# Patient Record
Sex: Male | Born: 1945
Health system: Southern US, Community
[De-identification: ages and names within clinical notes are randomized; demographics above are authoritative.]

## PROBLEM LIST (undated history)

## (undated) DIAGNOSIS — I4892 Unspecified atrial flutter: Secondary | ICD-10-CM

## (undated) DIAGNOSIS — D696 Thrombocytopenia, unspecified: Secondary | ICD-10-CM

## (undated) DIAGNOSIS — I10 Essential (primary) hypertension: Secondary | ICD-10-CM

## (undated) DIAGNOSIS — E119 Type 2 diabetes mellitus without complications: Secondary | ICD-10-CM

## (undated) DIAGNOSIS — J45909 Unspecified asthma, uncomplicated: Secondary | ICD-10-CM

## (undated) DIAGNOSIS — E785 Hyperlipidemia, unspecified: Secondary | ICD-10-CM

## (undated) DIAGNOSIS — D72810 Lymphocytopenia: Secondary | ICD-10-CM

## (undated) DIAGNOSIS — N189 Chronic kidney disease, unspecified: Secondary | ICD-10-CM

## (undated) HISTORY — PX: MELANOMA EXCISION: SHX5266

## (undated) HISTORY — DX: Type 2 diabetes mellitus without complications: E11.9

## (undated) HISTORY — DX: Thrombocytopenia, unspecified: D69.6

## (undated) HISTORY — DX: Hyperlipidemia, unspecified: E78.5

## (undated) HISTORY — DX: Unspecified asthma, uncomplicated: J45.909

## (undated) HISTORY — DX: Chronic kidney disease, unspecified: N18.9

## (undated) HISTORY — DX: Unspecified atrial flutter: I48.92

## (undated) HISTORY — PX: OTHER SURGICAL HISTORY: SHX169

## (undated) HISTORY — PX: KNEE SURGERY: SHX244

## (undated) HISTORY — DX: Lymphocytopenia: D72.810

## (undated) HISTORY — DX: Essential (primary) hypertension: I10

---

## 2004-05-26 ENCOUNTER — Ambulatory Visit: Payer: Self-pay | Admitting: Internal Medicine

## 2005-08-03 ENCOUNTER — Ambulatory Visit: Payer: Self-pay | Admitting: Internal Medicine

## 2005-09-15 ENCOUNTER — Ambulatory Visit: Payer: Self-pay | Admitting: Internal Medicine

## 2006-10-24 ENCOUNTER — Emergency Department: Payer: Self-pay | Admitting: Emergency Medicine

## 2009-08-19 ENCOUNTER — Ambulatory Visit: Payer: Self-pay | Admitting: Specialist

## 2009-11-12 ENCOUNTER — Ambulatory Visit: Payer: Self-pay | Admitting: Internal Medicine

## 2009-12-09 ENCOUNTER — Ambulatory Visit: Payer: Self-pay | Admitting: Internal Medicine

## 2010-01-19 ENCOUNTER — Ambulatory Visit: Payer: Self-pay | Admitting: Internal Medicine

## 2010-01-22 ENCOUNTER — Ambulatory Visit: Payer: Self-pay | Admitting: Internal Medicine

## 2011-06-02 DIAGNOSIS — I10 Essential (primary) hypertension: Secondary | ICD-10-CM | POA: Diagnosis not present

## 2011-06-02 DIAGNOSIS — E119 Type 2 diabetes mellitus without complications: Secondary | ICD-10-CM | POA: Diagnosis not present

## 2011-06-06 ENCOUNTER — Inpatient Hospital Stay: Payer: Self-pay | Admitting: Internal Medicine

## 2011-06-06 DIAGNOSIS — IMO0001 Reserved for inherently not codable concepts without codable children: Secondary | ICD-10-CM | POA: Diagnosis not present

## 2011-06-06 DIAGNOSIS — I4891 Unspecified atrial fibrillation: Secondary | ICD-10-CM | POA: Diagnosis not present

## 2011-06-06 DIAGNOSIS — Z8249 Family history of ischemic heart disease and other diseases of the circulatory system: Secondary | ICD-10-CM | POA: Diagnosis not present

## 2011-06-06 DIAGNOSIS — I059 Rheumatic mitral valve disease, unspecified: Secondary | ICD-10-CM | POA: Diagnosis not present

## 2011-06-06 DIAGNOSIS — E119 Type 2 diabetes mellitus without complications: Secondary | ICD-10-CM | POA: Diagnosis present

## 2011-06-06 DIAGNOSIS — I4892 Unspecified atrial flutter: Secondary | ICD-10-CM | POA: Diagnosis not present

## 2011-06-06 DIAGNOSIS — Z801 Family history of malignant neoplasm of trachea, bronchus and lung: Secondary | ICD-10-CM | POA: Diagnosis not present

## 2011-06-06 DIAGNOSIS — Z8489 Family history of other specified conditions: Secondary | ICD-10-CM | POA: Diagnosis not present

## 2011-06-06 DIAGNOSIS — I10 Essential (primary) hypertension: Secondary | ICD-10-CM | POA: Diagnosis not present

## 2011-06-06 DIAGNOSIS — B9789 Other viral agents as the cause of diseases classified elsewhere: Secondary | ICD-10-CM | POA: Diagnosis present

## 2011-06-06 DIAGNOSIS — R002 Palpitations: Secondary | ICD-10-CM | POA: Diagnosis not present

## 2011-06-06 DIAGNOSIS — Z79899 Other long term (current) drug therapy: Secondary | ICD-10-CM | POA: Diagnosis not present

## 2011-06-06 DIAGNOSIS — E785 Hyperlipidemia, unspecified: Secondary | ICD-10-CM | POA: Diagnosis not present

## 2011-06-06 DIAGNOSIS — R000 Tachycardia, unspecified: Secondary | ICD-10-CM | POA: Diagnosis not present

## 2011-06-06 DIAGNOSIS — E039 Hypothyroidism, unspecified: Secondary | ICD-10-CM | POA: Diagnosis present

## 2011-06-06 DIAGNOSIS — Z7982 Long term (current) use of aspirin: Secondary | ICD-10-CM | POA: Diagnosis not present

## 2011-06-06 LAB — COMPREHENSIVE METABOLIC PANEL
Anion Gap: 13 (ref 7–16)
BUN: 31 mg/dL — ABNORMAL HIGH (ref 7–18)
Bilirubin,Total: 0.6 mg/dL (ref 0.2–1.0)
Calcium, Total: 8.3 mg/dL — ABNORMAL LOW (ref 8.5–10.1)
Creatinine: 1.51 mg/dL — ABNORMAL HIGH (ref 0.60–1.30)
EGFR (African American): >60
EGFR (Non-African Amer.): 50 — ABNORMAL LOW
Glucose: 183 mg/dL — ABNORMAL HIGH (ref 65–99)
Osmolality: 291 (ref 275–301)
Potassium: 4.2 mmol/L (ref 3.5–5.1)
SGOT(AST): 24 U/L (ref 15–37)
Total Protein: 7.1 g/dL (ref 6.4–8.2)

## 2011-06-06 LAB — PRO B NATRIURETIC PEPTIDE: B-Type Natriuretic Peptide: 2223 pg/mL — ABNORMAL HIGH (ref 0–125)

## 2011-06-06 LAB — CBC
HGB: 15.1 g/dL (ref 13.0–18.0)
MCH: 28.6 pg (ref 26.0–34.0)
MCHC: 33.6 g/dL (ref 32.0–36.0)
Platelet: 135 10*3/uL — ABNORMAL LOW (ref 150–440)
RBC: 5.26 10*6/uL (ref 4.40–5.90)

## 2011-06-06 LAB — URINALYSIS, COMPLETE
Bacteria: NONE SEEN
Ketone: NEGATIVE
Nitrite: NEGATIVE
Ph: 5 (ref 4.5–8.0)
Protein: NEGATIVE
Specific Gravity: 1.014 (ref 1.003–1.030)

## 2011-06-06 LAB — TSH: Thyroid Stimulating Horm: 5.09 u[IU]/mL — ABNORMAL HIGH

## 2011-06-06 LAB — CK TOTAL AND CKMB (NOT AT ARMC)
CK, Total: 147 U/L (ref 35–232)
CK-MB: 1.7 ng/mL (ref 0.5–3.6)

## 2011-06-06 LAB — MAGNESIUM: Magnesium: 1.4 mg/dL — ABNORMAL LOW

## 2011-06-07 DIAGNOSIS — I4892 Unspecified atrial flutter: Secondary | ICD-10-CM | POA: Diagnosis not present

## 2011-06-07 DIAGNOSIS — I10 Essential (primary) hypertension: Secondary | ICD-10-CM | POA: Diagnosis not present

## 2011-06-07 DIAGNOSIS — I059 Rheumatic mitral valve disease, unspecified: Secondary | ICD-10-CM | POA: Diagnosis not present

## 2011-06-07 DIAGNOSIS — E785 Hyperlipidemia, unspecified: Secondary | ICD-10-CM | POA: Diagnosis not present

## 2011-06-07 DIAGNOSIS — I4891 Unspecified atrial fibrillation: Secondary | ICD-10-CM | POA: Diagnosis not present

## 2011-06-07 LAB — BASIC METABOLIC PANEL
Anion Gap: 10 (ref 7–16)
BUN: 17 mg/dL (ref 7–18)
Co2: 28 mmol/L (ref 21–32)
EGFR (African American): >60
EGFR (Non-African Amer.): >60
Glucose: 156 mg/dL — ABNORMAL HIGH (ref 65–99)
Sodium: 144 mmol/L (ref 136–145)

## 2011-06-07 LAB — TSH: Thyroid Stimulating Horm: 3.7 u[IU]/mL

## 2011-06-09 DIAGNOSIS — E038 Other specified hypothyroidism: Secondary | ICD-10-CM | POA: Diagnosis not present

## 2011-06-09 DIAGNOSIS — I4891 Unspecified atrial fibrillation: Secondary | ICD-10-CM | POA: Diagnosis not present

## 2011-06-09 DIAGNOSIS — IMO0001 Reserved for inherently not codable concepts without codable children: Secondary | ICD-10-CM | POA: Diagnosis not present

## 2011-06-09 DIAGNOSIS — I10 Essential (primary) hypertension: Secondary | ICD-10-CM | POA: Diagnosis not present

## 2011-06-09 DIAGNOSIS — R110 Nausea: Secondary | ICD-10-CM | POA: Diagnosis not present

## 2011-06-09 DIAGNOSIS — E785 Hyperlipidemia, unspecified: Secondary | ICD-10-CM | POA: Diagnosis not present

## 2011-06-09 DIAGNOSIS — N289 Disorder of kidney and ureter, unspecified: Secondary | ICD-10-CM | POA: Diagnosis not present

## 2011-06-09 DIAGNOSIS — R079 Chest pain, unspecified: Secondary | ICD-10-CM | POA: Diagnosis not present

## 2011-06-10 DIAGNOSIS — I119 Hypertensive heart disease without heart failure: Secondary | ICD-10-CM | POA: Diagnosis not present

## 2011-06-10 DIAGNOSIS — E782 Mixed hyperlipidemia: Secondary | ICD-10-CM | POA: Diagnosis not present

## 2011-06-10 DIAGNOSIS — I4892 Unspecified atrial flutter: Secondary | ICD-10-CM | POA: Diagnosis not present

## 2011-06-10 DIAGNOSIS — E119 Type 2 diabetes mellitus without complications: Secondary | ICD-10-CM | POA: Diagnosis not present

## 2011-06-16 DIAGNOSIS — I119 Hypertensive heart disease without heart failure: Secondary | ICD-10-CM | POA: Diagnosis not present

## 2011-06-16 DIAGNOSIS — I4891 Unspecified atrial fibrillation: Secondary | ICD-10-CM | POA: Diagnosis not present

## 2011-06-16 DIAGNOSIS — E291 Testicular hypofunction: Secondary | ICD-10-CM | POA: Diagnosis not present

## 2011-06-22 ENCOUNTER — Ambulatory Visit: Payer: Self-pay | Admitting: Internal Medicine

## 2011-06-22 DIAGNOSIS — R002 Palpitations: Secondary | ICD-10-CM | POA: Diagnosis not present

## 2011-06-22 DIAGNOSIS — E785 Hyperlipidemia, unspecified: Secondary | ICD-10-CM | POA: Diagnosis not present

## 2011-06-22 DIAGNOSIS — E119 Type 2 diabetes mellitus without complications: Secondary | ICD-10-CM | POA: Diagnosis not present

## 2011-06-22 DIAGNOSIS — I4892 Unspecified atrial flutter: Secondary | ICD-10-CM | POA: Diagnosis not present

## 2011-06-22 DIAGNOSIS — E669 Obesity, unspecified: Secondary | ICD-10-CM | POA: Diagnosis not present

## 2011-06-22 DIAGNOSIS — I4891 Unspecified atrial fibrillation: Secondary | ICD-10-CM | POA: Diagnosis not present

## 2011-06-22 DIAGNOSIS — Z7982 Long term (current) use of aspirin: Secondary | ICD-10-CM | POA: Diagnosis not present

## 2011-06-22 DIAGNOSIS — I10 Essential (primary) hypertension: Secondary | ICD-10-CM | POA: Diagnosis not present

## 2011-06-22 DIAGNOSIS — Z79899 Other long term (current) drug therapy: Secondary | ICD-10-CM | POA: Diagnosis not present

## 2011-06-22 DIAGNOSIS — I428 Other cardiomyopathies: Secondary | ICD-10-CM | POA: Diagnosis not present

## 2011-06-30 DIAGNOSIS — I119 Hypertensive heart disease without heart failure: Secondary | ICD-10-CM | POA: Diagnosis not present

## 2011-06-30 DIAGNOSIS — I2510 Atherosclerotic heart disease of native coronary artery without angina pectoris: Secondary | ICD-10-CM | POA: Diagnosis not present

## 2011-06-30 DIAGNOSIS — I471 Supraventricular tachycardia: Secondary | ICD-10-CM | POA: Diagnosis not present

## 2011-07-09 DIAGNOSIS — Z79899 Other long term (current) drug therapy: Secondary | ICD-10-CM | POA: Diagnosis not present

## 2011-07-09 DIAGNOSIS — R0989 Other specified symptoms and signs involving the circulatory and respiratory systems: Secondary | ICD-10-CM | POA: Diagnosis not present

## 2011-07-09 DIAGNOSIS — I4892 Unspecified atrial flutter: Secondary | ICD-10-CM | POA: Diagnosis not present

## 2011-07-09 DIAGNOSIS — I4891 Unspecified atrial fibrillation: Secondary | ICD-10-CM | POA: Diagnosis not present

## 2011-07-09 DIAGNOSIS — R0609 Other forms of dyspnea: Secondary | ICD-10-CM | POA: Diagnosis not present

## 2011-07-21 DIAGNOSIS — E119 Type 2 diabetes mellitus without complications: Secondary | ICD-10-CM | POA: Diagnosis not present

## 2011-07-21 DIAGNOSIS — E785 Hyperlipidemia, unspecified: Secondary | ICD-10-CM | POA: Diagnosis not present

## 2011-07-21 DIAGNOSIS — I4891 Unspecified atrial fibrillation: Secondary | ICD-10-CM | POA: Diagnosis not present

## 2011-07-21 DIAGNOSIS — I10 Essential (primary) hypertension: Secondary | ICD-10-CM | POA: Diagnosis not present

## 2011-07-22 DIAGNOSIS — J019 Acute sinusitis, unspecified: Secondary | ICD-10-CM | POA: Diagnosis not present

## 2011-07-26 DIAGNOSIS — I119 Hypertensive heart disease without heart failure: Secondary | ICD-10-CM | POA: Diagnosis not present

## 2011-07-26 DIAGNOSIS — I4891 Unspecified atrial fibrillation: Secondary | ICD-10-CM | POA: Diagnosis not present

## 2011-07-26 DIAGNOSIS — E119 Type 2 diabetes mellitus without complications: Secondary | ICD-10-CM | POA: Diagnosis not present

## 2011-07-26 DIAGNOSIS — E782 Mixed hyperlipidemia: Secondary | ICD-10-CM | POA: Diagnosis not present

## 2011-08-30 DIAGNOSIS — E782 Mixed hyperlipidemia: Secondary | ICD-10-CM | POA: Diagnosis not present

## 2011-08-30 DIAGNOSIS — I4892 Unspecified atrial flutter: Secondary | ICD-10-CM | POA: Diagnosis not present

## 2011-08-30 DIAGNOSIS — I059 Rheumatic mitral valve disease, unspecified: Secondary | ICD-10-CM | POA: Diagnosis not present

## 2011-08-30 DIAGNOSIS — I119 Hypertensive heart disease without heart failure: Secondary | ICD-10-CM | POA: Diagnosis not present

## 2011-08-31 DIAGNOSIS — I119 Hypertensive heart disease without heart failure: Secondary | ICD-10-CM | POA: Diagnosis not present

## 2011-08-31 DIAGNOSIS — E782 Mixed hyperlipidemia: Secondary | ICD-10-CM | POA: Diagnosis not present

## 2011-08-31 DIAGNOSIS — I4892 Unspecified atrial flutter: Secondary | ICD-10-CM | POA: Diagnosis not present

## 2011-09-01 DIAGNOSIS — Z8679 Personal history of other diseases of the circulatory system: Secondary | ICD-10-CM | POA: Insufficient documentation

## 2011-09-03 DIAGNOSIS — I4891 Unspecified atrial fibrillation: Secondary | ICD-10-CM | POA: Diagnosis not present

## 2011-09-03 DIAGNOSIS — I4892 Unspecified atrial flutter: Secondary | ICD-10-CM | POA: Diagnosis not present

## 2011-09-03 DIAGNOSIS — R0609 Other forms of dyspnea: Secondary | ICD-10-CM | POA: Diagnosis not present

## 2011-09-03 DIAGNOSIS — R0989 Other specified symptoms and signs involving the circulatory and respiratory systems: Secondary | ICD-10-CM | POA: Diagnosis not present

## 2011-09-03 DIAGNOSIS — Z79899 Other long term (current) drug therapy: Secondary | ICD-10-CM | POA: Diagnosis not present

## 2011-09-10 DIAGNOSIS — E038 Other specified hypothyroidism: Secondary | ICD-10-CM | POA: Diagnosis not present

## 2011-09-10 DIAGNOSIS — IMO0001 Reserved for inherently not codable concepts without codable children: Secondary | ICD-10-CM | POA: Diagnosis not present

## 2011-09-10 DIAGNOSIS — E291 Testicular hypofunction: Secondary | ICD-10-CM | POA: Diagnosis not present

## 2011-09-10 DIAGNOSIS — Z125 Encounter for screening for malignant neoplasm of prostate: Secondary | ICD-10-CM | POA: Diagnosis not present

## 2011-09-10 DIAGNOSIS — Z0000 Encounter for general adult medical examination without abnormal findings: Secondary | ICD-10-CM | POA: Diagnosis not present

## 2011-09-10 DIAGNOSIS — E785 Hyperlipidemia, unspecified: Secondary | ICD-10-CM | POA: Diagnosis not present

## 2011-09-10 DIAGNOSIS — I10 Essential (primary) hypertension: Secondary | ICD-10-CM | POA: Diagnosis not present

## 2011-09-14 DIAGNOSIS — Z0000 Encounter for general adult medical examination without abnormal findings: Secondary | ICD-10-CM | POA: Diagnosis not present

## 2011-09-14 DIAGNOSIS — Z125 Encounter for screening for malignant neoplasm of prostate: Secondary | ICD-10-CM | POA: Diagnosis not present

## 2011-10-13 DIAGNOSIS — Z79899 Other long term (current) drug therapy: Secondary | ICD-10-CM | POA: Diagnosis not present

## 2011-10-13 DIAGNOSIS — Z01818 Encounter for other preprocedural examination: Secondary | ICD-10-CM | POA: Diagnosis not present

## 2011-10-13 DIAGNOSIS — I428 Other cardiomyopathies: Secondary | ICD-10-CM | POA: Diagnosis not present

## 2011-10-13 DIAGNOSIS — I4891 Unspecified atrial fibrillation: Secondary | ICD-10-CM | POA: Diagnosis not present

## 2011-10-13 DIAGNOSIS — R0989 Other specified symptoms and signs involving the circulatory and respiratory systems: Secondary | ICD-10-CM | POA: Diagnosis not present

## 2011-10-13 DIAGNOSIS — I4892 Unspecified atrial flutter: Secondary | ICD-10-CM | POA: Diagnosis not present

## 2011-10-13 DIAGNOSIS — R0609 Other forms of dyspnea: Secondary | ICD-10-CM | POA: Diagnosis not present

## 2011-10-14 DIAGNOSIS — Z79899 Other long term (current) drug therapy: Secondary | ICD-10-CM | POA: Diagnosis not present

## 2011-10-14 DIAGNOSIS — E039 Hypothyroidism, unspecified: Secondary | ICD-10-CM | POA: Diagnosis not present

## 2011-10-14 DIAGNOSIS — E785 Hyperlipidemia, unspecified: Secondary | ICD-10-CM | POA: Diagnosis not present

## 2011-10-14 DIAGNOSIS — E669 Obesity, unspecified: Secondary | ICD-10-CM | POA: Diagnosis not present

## 2011-10-14 DIAGNOSIS — Z6832 Body mass index (BMI) 32.0-32.9, adult: Secondary | ICD-10-CM | POA: Diagnosis not present

## 2011-10-14 DIAGNOSIS — Z87891 Personal history of nicotine dependence: Secondary | ICD-10-CM | POA: Diagnosis not present

## 2011-10-14 DIAGNOSIS — I10 Essential (primary) hypertension: Secondary | ICD-10-CM | POA: Diagnosis not present

## 2011-10-14 DIAGNOSIS — D696 Thrombocytopenia, unspecified: Secondary | ICD-10-CM | POA: Diagnosis not present

## 2011-10-14 DIAGNOSIS — I4891 Unspecified atrial fibrillation: Secondary | ICD-10-CM | POA: Diagnosis not present

## 2011-10-14 DIAGNOSIS — I4892 Unspecified atrial flutter: Secondary | ICD-10-CM | POA: Diagnosis not present

## 2011-10-14 DIAGNOSIS — I428 Other cardiomyopathies: Secondary | ICD-10-CM | POA: Diagnosis not present

## 2011-10-14 DIAGNOSIS — E119 Type 2 diabetes mellitus without complications: Secondary | ICD-10-CM | POA: Diagnosis not present

## 2011-10-14 DIAGNOSIS — G4730 Sleep apnea, unspecified: Secondary | ICD-10-CM | POA: Diagnosis not present

## 2011-11-10 DIAGNOSIS — I4892 Unspecified atrial flutter: Secondary | ICD-10-CM | POA: Diagnosis not present

## 2011-11-12 DIAGNOSIS — Z7901 Long term (current) use of anticoagulants: Secondary | ICD-10-CM | POA: Diagnosis not present

## 2011-11-12 DIAGNOSIS — I4892 Unspecified atrial flutter: Secondary | ICD-10-CM | POA: Diagnosis not present

## 2011-11-12 DIAGNOSIS — I4891 Unspecified atrial fibrillation: Secondary | ICD-10-CM | POA: Diagnosis not present

## 2011-11-15 ENCOUNTER — Ambulatory Visit: Payer: Self-pay | Admitting: Internal Medicine

## 2011-11-15 DIAGNOSIS — I4891 Unspecified atrial fibrillation: Secondary | ICD-10-CM | POA: Diagnosis not present

## 2011-11-15 DIAGNOSIS — I428 Other cardiomyopathies: Secondary | ICD-10-CM | POA: Diagnosis not present

## 2011-11-15 DIAGNOSIS — E785 Hyperlipidemia, unspecified: Secondary | ICD-10-CM | POA: Diagnosis not present

## 2011-11-15 DIAGNOSIS — E119 Type 2 diabetes mellitus without complications: Secondary | ICD-10-CM | POA: Diagnosis not present

## 2011-11-15 DIAGNOSIS — Z87891 Personal history of nicotine dependence: Secondary | ICD-10-CM | POA: Diagnosis not present

## 2011-11-15 DIAGNOSIS — Z6831 Body mass index (BMI) 31.0-31.9, adult: Secondary | ICD-10-CM | POA: Diagnosis not present

## 2011-11-15 DIAGNOSIS — Z8249 Family history of ischemic heart disease and other diseases of the circulatory system: Secondary | ICD-10-CM | POA: Diagnosis not present

## 2011-11-15 DIAGNOSIS — I38 Endocarditis, valve unspecified: Secondary | ICD-10-CM | POA: Diagnosis not present

## 2011-11-15 DIAGNOSIS — E079 Disorder of thyroid, unspecified: Secondary | ICD-10-CM | POA: Diagnosis not present

## 2011-11-15 DIAGNOSIS — I10 Essential (primary) hypertension: Secondary | ICD-10-CM | POA: Diagnosis not present

## 2011-11-15 DIAGNOSIS — Z801 Family history of malignant neoplasm of trachea, bronchus and lung: Secondary | ICD-10-CM | POA: Diagnosis not present

## 2011-11-15 DIAGNOSIS — I4892 Unspecified atrial flutter: Secondary | ICD-10-CM | POA: Diagnosis not present

## 2011-11-15 DIAGNOSIS — Z79899 Other long term (current) drug therapy: Secondary | ICD-10-CM | POA: Diagnosis not present

## 2011-11-15 LAB — BASIC METABOLIC PANEL
Anion Gap: 14 (ref 7–16)
BUN: 28 mg/dL — ABNORMAL HIGH (ref 7–18)
Chloride: 107 mmol/L (ref 98–107)
Co2: 19 mmol/L — ABNORMAL LOW (ref 21–32)
EGFR (African American): 60 — ABNORMAL LOW
EGFR (Non-African Amer.): 52 — ABNORMAL LOW
Glucose: 173 mg/dL — ABNORMAL HIGH (ref 65–99)

## 2011-11-22 DIAGNOSIS — Z7901 Long term (current) use of anticoagulants: Secondary | ICD-10-CM | POA: Diagnosis not present

## 2011-11-22 DIAGNOSIS — G4730 Sleep apnea, unspecified: Secondary | ICD-10-CM | POA: Diagnosis not present

## 2011-11-22 DIAGNOSIS — I4891 Unspecified atrial fibrillation: Secondary | ICD-10-CM | POA: Diagnosis not present

## 2011-12-14 DIAGNOSIS — I10 Essential (primary) hypertension: Secondary | ICD-10-CM | POA: Diagnosis not present

## 2011-12-14 DIAGNOSIS — E785 Hyperlipidemia, unspecified: Secondary | ICD-10-CM | POA: Diagnosis not present

## 2011-12-14 DIAGNOSIS — E119 Type 2 diabetes mellitus without complications: Secondary | ICD-10-CM | POA: Diagnosis not present

## 2011-12-24 DIAGNOSIS — I4891 Unspecified atrial fibrillation: Secondary | ICD-10-CM | POA: Diagnosis not present

## 2012-02-15 DIAGNOSIS — Z23 Encounter for immunization: Secondary | ICD-10-CM | POA: Diagnosis not present

## 2012-03-08 DIAGNOSIS — I119 Hypertensive heart disease without heart failure: Secondary | ICD-10-CM | POA: Diagnosis not present

## 2012-03-08 DIAGNOSIS — I4891 Unspecified atrial fibrillation: Secondary | ICD-10-CM | POA: Diagnosis not present

## 2012-03-08 DIAGNOSIS — I059 Rheumatic mitral valve disease, unspecified: Secondary | ICD-10-CM | POA: Diagnosis not present

## 2012-03-15 DIAGNOSIS — I10 Essential (primary) hypertension: Secondary | ICD-10-CM | POA: Diagnosis not present

## 2012-03-15 DIAGNOSIS — E785 Hyperlipidemia, unspecified: Secondary | ICD-10-CM | POA: Diagnosis not present

## 2012-03-15 DIAGNOSIS — E119 Type 2 diabetes mellitus without complications: Secondary | ICD-10-CM | POA: Diagnosis not present

## 2012-03-21 DIAGNOSIS — R7989 Other specified abnormal findings of blood chemistry: Secondary | ICD-10-CM | POA: Diagnosis not present

## 2012-04-03 DIAGNOSIS — N183 Chronic kidney disease, stage 3 unspecified: Secondary | ICD-10-CM | POA: Diagnosis not present

## 2012-04-03 DIAGNOSIS — N2581 Secondary hyperparathyroidism of renal origin: Secondary | ICD-10-CM | POA: Diagnosis not present

## 2012-04-03 DIAGNOSIS — E119 Type 2 diabetes mellitus without complications: Secondary | ICD-10-CM | POA: Diagnosis not present

## 2012-04-03 DIAGNOSIS — I10 Essential (primary) hypertension: Secondary | ICD-10-CM | POA: Diagnosis not present

## 2012-04-06 DIAGNOSIS — N183 Chronic kidney disease, stage 3 unspecified: Secondary | ICD-10-CM | POA: Diagnosis not present

## 2012-04-10 DIAGNOSIS — E119 Type 2 diabetes mellitus without complications: Secondary | ICD-10-CM | POA: Diagnosis not present

## 2012-04-10 DIAGNOSIS — N183 Chronic kidney disease, stage 3 unspecified: Secondary | ICD-10-CM | POA: Diagnosis not present

## 2012-05-01 DIAGNOSIS — N183 Chronic kidney disease, stage 3 unspecified: Secondary | ICD-10-CM | POA: Diagnosis not present

## 2012-05-01 DIAGNOSIS — E875 Hyperkalemia: Secondary | ICD-10-CM | POA: Diagnosis not present

## 2012-05-01 DIAGNOSIS — I10 Essential (primary) hypertension: Secondary | ICD-10-CM | POA: Diagnosis not present

## 2012-05-01 DIAGNOSIS — N2581 Secondary hyperparathyroidism of renal origin: Secondary | ICD-10-CM | POA: Diagnosis not present

## 2012-05-26 DIAGNOSIS — I4891 Unspecified atrial fibrillation: Secondary | ICD-10-CM | POA: Diagnosis not present

## 2012-06-06 DIAGNOSIS — E119 Type 2 diabetes mellitus without complications: Secondary | ICD-10-CM | POA: Diagnosis not present

## 2012-06-06 DIAGNOSIS — E785 Hyperlipidemia, unspecified: Secondary | ICD-10-CM | POA: Diagnosis not present

## 2012-06-06 DIAGNOSIS — I10 Essential (primary) hypertension: Secondary | ICD-10-CM | POA: Diagnosis not present

## 2012-06-27 DIAGNOSIS — I4891 Unspecified atrial fibrillation: Secondary | ICD-10-CM | POA: Diagnosis not present

## 2012-06-27 DIAGNOSIS — I10 Essential (primary) hypertension: Secondary | ICD-10-CM | POA: Diagnosis not present

## 2012-06-27 DIAGNOSIS — Z7901 Long term (current) use of anticoagulants: Secondary | ICD-10-CM | POA: Diagnosis not present

## 2012-06-27 DIAGNOSIS — E119 Type 2 diabetes mellitus without complications: Secondary | ICD-10-CM | POA: Diagnosis not present

## 2012-07-20 DIAGNOSIS — R7989 Other specified abnormal findings of blood chemistry: Secondary | ICD-10-CM | POA: Diagnosis not present

## 2012-08-28 DIAGNOSIS — I10 Essential (primary) hypertension: Secondary | ICD-10-CM | POA: Diagnosis not present

## 2012-08-28 DIAGNOSIS — N2581 Secondary hyperparathyroidism of renal origin: Secondary | ICD-10-CM | POA: Diagnosis not present

## 2012-08-28 DIAGNOSIS — R809 Proteinuria, unspecified: Secondary | ICD-10-CM | POA: Diagnosis not present

## 2012-08-28 DIAGNOSIS — N183 Chronic kidney disease, stage 3 unspecified: Secondary | ICD-10-CM | POA: Diagnosis not present

## 2012-09-11 DIAGNOSIS — E785 Hyperlipidemia, unspecified: Secondary | ICD-10-CM | POA: Diagnosis not present

## 2012-09-11 DIAGNOSIS — Z0000 Encounter for general adult medical examination without abnormal findings: Secondary | ICD-10-CM | POA: Diagnosis not present

## 2012-09-11 DIAGNOSIS — E038 Other specified hypothyroidism: Secondary | ICD-10-CM | POA: Diagnosis not present

## 2012-09-11 DIAGNOSIS — IMO0001 Reserved for inherently not codable concepts without codable children: Secondary | ICD-10-CM | POA: Diagnosis not present

## 2012-09-11 DIAGNOSIS — I10 Essential (primary) hypertension: Secondary | ICD-10-CM | POA: Diagnosis not present

## 2012-09-11 DIAGNOSIS — Z125 Encounter for screening for malignant neoplasm of prostate: Secondary | ICD-10-CM | POA: Diagnosis not present

## 2012-09-11 DIAGNOSIS — N183 Chronic kidney disease, stage 3 unspecified: Secondary | ICD-10-CM | POA: Diagnosis not present

## 2012-09-13 DIAGNOSIS — Z0000 Encounter for general adult medical examination without abnormal findings: Secondary | ICD-10-CM | POA: Diagnosis not present

## 2012-12-08 DIAGNOSIS — I4891 Unspecified atrial fibrillation: Secondary | ICD-10-CM | POA: Diagnosis not present

## 2012-12-20 DIAGNOSIS — IMO0001 Reserved for inherently not codable concepts without codable children: Secondary | ICD-10-CM | POA: Diagnosis not present

## 2012-12-20 DIAGNOSIS — E785 Hyperlipidemia, unspecified: Secondary | ICD-10-CM | POA: Diagnosis not present

## 2012-12-20 DIAGNOSIS — N183 Chronic kidney disease, stage 3 unspecified: Secondary | ICD-10-CM | POA: Diagnosis not present

## 2012-12-20 DIAGNOSIS — I10 Essential (primary) hypertension: Secondary | ICD-10-CM | POA: Diagnosis not present

## 2013-01-16 DIAGNOSIS — I4891 Unspecified atrial fibrillation: Secondary | ICD-10-CM | POA: Diagnosis not present

## 2013-02-01 DIAGNOSIS — Z23 Encounter for immunization: Secondary | ICD-10-CM | POA: Diagnosis not present

## 2013-02-26 DIAGNOSIS — I10 Essential (primary) hypertension: Secondary | ICD-10-CM | POA: Diagnosis not present

## 2013-02-26 DIAGNOSIS — N183 Chronic kidney disease, stage 3 unspecified: Secondary | ICD-10-CM | POA: Diagnosis not present

## 2013-02-26 DIAGNOSIS — D631 Anemia in chronic kidney disease: Secondary | ICD-10-CM | POA: Diagnosis not present

## 2013-02-26 DIAGNOSIS — IMO0001 Reserved for inherently not codable concepts without codable children: Secondary | ICD-10-CM | POA: Diagnosis not present

## 2013-02-26 DIAGNOSIS — N2581 Secondary hyperparathyroidism of renal origin: Secondary | ICD-10-CM | POA: Diagnosis not present

## 2013-03-26 DIAGNOSIS — IMO0001 Reserved for inherently not codable concepts without codable children: Secondary | ICD-10-CM | POA: Diagnosis not present

## 2013-03-26 DIAGNOSIS — E119 Type 2 diabetes mellitus without complications: Secondary | ICD-10-CM | POA: Diagnosis not present

## 2013-03-26 DIAGNOSIS — E785 Hyperlipidemia, unspecified: Secondary | ICD-10-CM | POA: Diagnosis not present

## 2013-03-26 DIAGNOSIS — I10 Essential (primary) hypertension: Secondary | ICD-10-CM | POA: Diagnosis not present

## 2013-06-20 DIAGNOSIS — E782 Mixed hyperlipidemia: Secondary | ICD-10-CM | POA: Diagnosis not present

## 2013-06-20 DIAGNOSIS — R0602 Shortness of breath: Secondary | ICD-10-CM | POA: Diagnosis not present

## 2013-06-20 DIAGNOSIS — I4892 Unspecified atrial flutter: Secondary | ICD-10-CM | POA: Diagnosis not present

## 2013-06-21 DIAGNOSIS — I4892 Unspecified atrial flutter: Secondary | ICD-10-CM | POA: Diagnosis not present

## 2013-06-22 ENCOUNTER — Ambulatory Visit: Payer: Self-pay | Admitting: Internal Medicine

## 2013-06-22 DIAGNOSIS — Z79899 Other long term (current) drug therapy: Secondary | ICD-10-CM | POA: Diagnosis not present

## 2013-06-22 DIAGNOSIS — Z7901 Long term (current) use of anticoagulants: Secondary | ICD-10-CM | POA: Diagnosis not present

## 2013-06-22 DIAGNOSIS — Z8249 Family history of ischemic heart disease and other diseases of the circulatory system: Secondary | ICD-10-CM | POA: Diagnosis not present

## 2013-06-22 DIAGNOSIS — Z7982 Long term (current) use of aspirin: Secondary | ICD-10-CM | POA: Diagnosis not present

## 2013-06-22 DIAGNOSIS — E785 Hyperlipidemia, unspecified: Secondary | ICD-10-CM | POA: Diagnosis not present

## 2013-06-22 DIAGNOSIS — E119 Type 2 diabetes mellitus without complications: Secondary | ICD-10-CM | POA: Diagnosis not present

## 2013-06-22 DIAGNOSIS — I4891 Unspecified atrial fibrillation: Secondary | ICD-10-CM | POA: Diagnosis not present

## 2013-06-22 DIAGNOSIS — Z87891 Personal history of nicotine dependence: Secondary | ICD-10-CM | POA: Diagnosis not present

## 2013-06-22 DIAGNOSIS — I10 Essential (primary) hypertension: Secondary | ICD-10-CM | POA: Diagnosis not present

## 2013-06-22 DIAGNOSIS — E039 Hypothyroidism, unspecified: Secondary | ICD-10-CM | POA: Diagnosis not present

## 2013-06-22 DIAGNOSIS — I059 Rheumatic mitral valve disease, unspecified: Secondary | ICD-10-CM | POA: Diagnosis not present

## 2013-06-22 DIAGNOSIS — I4892 Unspecified atrial flutter: Secondary | ICD-10-CM | POA: Diagnosis not present

## 2013-06-27 DIAGNOSIS — E782 Mixed hyperlipidemia: Secondary | ICD-10-CM | POA: Diagnosis not present

## 2013-06-27 DIAGNOSIS — I4891 Unspecified atrial fibrillation: Secondary | ICD-10-CM | POA: Diagnosis not present

## 2013-06-27 DIAGNOSIS — I5022 Chronic systolic (congestive) heart failure: Secondary | ICD-10-CM | POA: Diagnosis not present

## 2013-07-03 DIAGNOSIS — E785 Hyperlipidemia, unspecified: Secondary | ICD-10-CM | POA: Diagnosis not present

## 2013-07-03 DIAGNOSIS — N183 Chronic kidney disease, stage 3 unspecified: Secondary | ICD-10-CM | POA: Diagnosis not present

## 2013-07-03 DIAGNOSIS — E119 Type 2 diabetes mellitus without complications: Secondary | ICD-10-CM | POA: Diagnosis not present

## 2013-07-03 DIAGNOSIS — E1129 Type 2 diabetes mellitus with other diabetic kidney complication: Secondary | ICD-10-CM | POA: Diagnosis not present

## 2013-07-03 DIAGNOSIS — I10 Essential (primary) hypertension: Secondary | ICD-10-CM | POA: Diagnosis not present

## 2013-08-27 DIAGNOSIS — D631 Anemia in chronic kidney disease: Secondary | ICD-10-CM | POA: Diagnosis not present

## 2013-08-27 DIAGNOSIS — N183 Chronic kidney disease, stage 3 unspecified: Secondary | ICD-10-CM | POA: Diagnosis not present

## 2013-08-27 DIAGNOSIS — I10 Essential (primary) hypertension: Secondary | ICD-10-CM | POA: Diagnosis not present

## 2013-08-27 DIAGNOSIS — N2581 Secondary hyperparathyroidism of renal origin: Secondary | ICD-10-CM | POA: Diagnosis not present

## 2013-08-27 DIAGNOSIS — E119 Type 2 diabetes mellitus without complications: Secondary | ICD-10-CM | POA: Diagnosis not present

## 2013-09-18 DIAGNOSIS — Z1331 Encounter for screening for depression: Secondary | ICD-10-CM | POA: Diagnosis not present

## 2013-09-18 DIAGNOSIS — Z0000 Encounter for general adult medical examination without abnormal findings: Secondary | ICD-10-CM | POA: Diagnosis not present

## 2013-09-18 DIAGNOSIS — E785 Hyperlipidemia, unspecified: Secondary | ICD-10-CM | POA: Diagnosis not present

## 2013-09-18 DIAGNOSIS — Z125 Encounter for screening for malignant neoplasm of prostate: Secondary | ICD-10-CM | POA: Diagnosis not present

## 2013-09-18 DIAGNOSIS — IMO0001 Reserved for inherently not codable concepts without codable children: Secondary | ICD-10-CM | POA: Diagnosis not present

## 2013-09-18 DIAGNOSIS — E038 Other specified hypothyroidism: Secondary | ICD-10-CM | POA: Diagnosis not present

## 2013-10-03 DIAGNOSIS — R7989 Other specified abnormal findings of blood chemistry: Secondary | ICD-10-CM | POA: Diagnosis not present

## 2013-10-31 DIAGNOSIS — I2589 Other forms of chronic ischemic heart disease: Secondary | ICD-10-CM | POA: Diagnosis not present

## 2013-10-31 DIAGNOSIS — I4891 Unspecified atrial fibrillation: Secondary | ICD-10-CM | POA: Diagnosis not present

## 2013-10-31 DIAGNOSIS — I10 Essential (primary) hypertension: Secondary | ICD-10-CM | POA: Diagnosis not present

## 2013-10-31 DIAGNOSIS — I4892 Unspecified atrial flutter: Secondary | ICD-10-CM | POA: Diagnosis not present

## 2013-11-20 DIAGNOSIS — E785 Hyperlipidemia, unspecified: Secondary | ICD-10-CM | POA: Diagnosis not present

## 2013-11-20 DIAGNOSIS — E1129 Type 2 diabetes mellitus with other diabetic kidney complication: Secondary | ICD-10-CM | POA: Diagnosis not present

## 2013-11-20 DIAGNOSIS — D649 Anemia, unspecified: Secondary | ICD-10-CM | POA: Diagnosis not present

## 2013-11-20 DIAGNOSIS — D696 Thrombocytopenia, unspecified: Secondary | ICD-10-CM | POA: Diagnosis not present

## 2013-11-20 DIAGNOSIS — D539 Nutritional anemia, unspecified: Secondary | ICD-10-CM | POA: Diagnosis not present

## 2013-11-20 DIAGNOSIS — E119 Type 2 diabetes mellitus without complications: Secondary | ICD-10-CM | POA: Diagnosis not present

## 2013-11-20 DIAGNOSIS — I129 Hypertensive chronic kidney disease with stage 1 through stage 4 chronic kidney disease, or unspecified chronic kidney disease: Secondary | ICD-10-CM | POA: Diagnosis not present

## 2013-11-20 DIAGNOSIS — I080 Rheumatic disorders of both mitral and aortic valves: Secondary | ICD-10-CM | POA: Diagnosis not present

## 2013-12-04 DIAGNOSIS — R7989 Other specified abnormal findings of blood chemistry: Secondary | ICD-10-CM | POA: Diagnosis not present

## 2013-12-07 ENCOUNTER — Ambulatory Visit: Payer: Self-pay | Admitting: Internal Medicine

## 2013-12-07 DIAGNOSIS — E785 Hyperlipidemia, unspecified: Secondary | ICD-10-CM | POA: Diagnosis not present

## 2013-12-07 DIAGNOSIS — D72810 Lymphocytopenia: Secondary | ICD-10-CM | POA: Diagnosis not present

## 2013-12-07 DIAGNOSIS — Z7982 Long term (current) use of aspirin: Secondary | ICD-10-CM | POA: Diagnosis not present

## 2013-12-07 DIAGNOSIS — D696 Thrombocytopenia, unspecified: Secondary | ICD-10-CM | POA: Diagnosis not present

## 2013-12-07 DIAGNOSIS — Z79899 Other long term (current) drug therapy: Secondary | ICD-10-CM | POA: Diagnosis not present

## 2013-12-07 DIAGNOSIS — R718 Other abnormality of red blood cells: Secondary | ICD-10-CM | POA: Diagnosis not present

## 2013-12-07 DIAGNOSIS — I10 Essential (primary) hypertension: Secondary | ICD-10-CM | POA: Diagnosis not present

## 2013-12-07 DIAGNOSIS — E119 Type 2 diabetes mellitus without complications: Secondary | ICD-10-CM | POA: Diagnosis not present

## 2013-12-07 LAB — CBC CANCER CENTER
Basophil #: 0 x10 3/mm (ref 0.0–0.1)
Basophil %: 0.9 %
Eosinophil #: 0.1 x10 3/mm (ref 0.0–0.7)
Eosinophil %: 2.5 %
HCT: 46 % (ref 40.0–52.0)
HGB: 15.1 g/dL (ref 13.0–18.0)
Lymphocyte #: 0.9 x10 3/mm — ABNORMAL LOW (ref 1.0–3.6)
Lymphocyte %: 25.3 %
MCH: 26.6 pg (ref 26.0–34.0)
MCHC: 32.8 g/dL (ref 32.0–36.0)
MCV: 81 fL (ref 80–100)
Monocyte #: 0.4 x10 3/mm (ref 0.2–1.0)
Monocyte %: 11.7 %
NEUTROS ABS: 2.2 x10 3/mm (ref 1.4–6.5)
NEUTROS PCT: 59.6 %
Platelet: 95 x10 3/mm — ABNORMAL LOW (ref 150–440)
RBC: 5.67 10*6/uL (ref 4.40–5.90)
RDW: 16.6 % — ABNORMAL HIGH (ref 11.5–14.5)
WBC: 3.7 x10 3/mm — AB (ref 3.8–10.6)

## 2013-12-07 LAB — IRON AND TIBC
IRON BIND. CAP.(TOTAL): 381 ug/dL (ref 250–450)
Iron Saturation: 25 %
Iron: 94 ug/dL (ref 65–175)
Unbound Iron-Bind.Cap.: 287 ug/dL

## 2013-12-07 LAB — FERRITIN: Ferritin (ARMC): 19 ng/mL (ref 8–388)

## 2013-12-20 DIAGNOSIS — D696 Thrombocytopenia, unspecified: Secondary | ICD-10-CM | POA: Diagnosis not present

## 2013-12-20 DIAGNOSIS — D72810 Lymphocytopenia: Secondary | ICD-10-CM | POA: Diagnosis not present

## 2013-12-20 DIAGNOSIS — R718 Other abnormality of red blood cells: Secondary | ICD-10-CM | POA: Diagnosis not present

## 2013-12-22 ENCOUNTER — Ambulatory Visit: Payer: Self-pay | Admitting: Internal Medicine

## 2013-12-22 DIAGNOSIS — D72810 Lymphocytopenia: Secondary | ICD-10-CM | POA: Diagnosis not present

## 2013-12-22 DIAGNOSIS — R718 Other abnormality of red blood cells: Secondary | ICD-10-CM | POA: Diagnosis not present

## 2013-12-22 DIAGNOSIS — D696 Thrombocytopenia, unspecified: Secondary | ICD-10-CM | POA: Diagnosis not present

## 2014-01-15 DIAGNOSIS — R718 Other abnormality of red blood cells: Secondary | ICD-10-CM | POA: Diagnosis not present

## 2014-01-15 DIAGNOSIS — D696 Thrombocytopenia, unspecified: Secondary | ICD-10-CM | POA: Diagnosis not present

## 2014-01-15 DIAGNOSIS — D72810 Lymphocytopenia: Secondary | ICD-10-CM | POA: Diagnosis not present

## 2014-01-15 LAB — OCCULT BLOOD X 1 CARD TO LAB, STOOL
OCCULT BLOOD, FECES: NEGATIVE
OCCULT BLOOD, FECES: NEGATIVE
Occult Blood, Feces: NEGATIVE

## 2014-01-15 IMAGING — US US GALLBLADDER-BILIARY (RUQ)
1 series · 9 of 9 positions shown · non-contrast
Comparison: None.

CLINICAL DATA: Thrombocytopenia.  Evaluate spleen.

EXAM:
US ABDOMEN LIMITED - LEFT UPPER QUADRANT

[Series 1: us gallbladder-biliary (ruq) · 0.31mm/px · 9 of 9 slices shown]
[im 1/9]
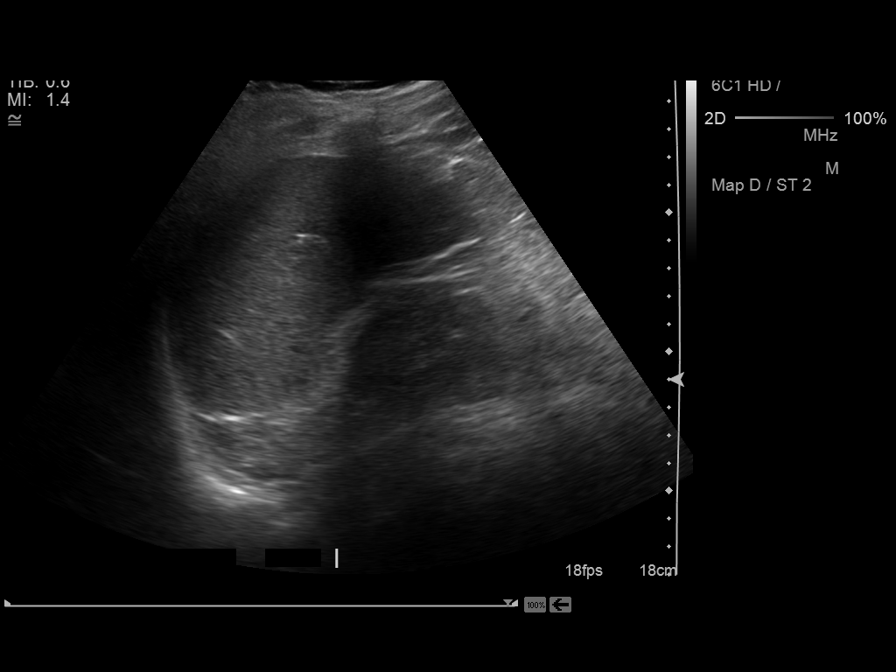
[im 2/9]
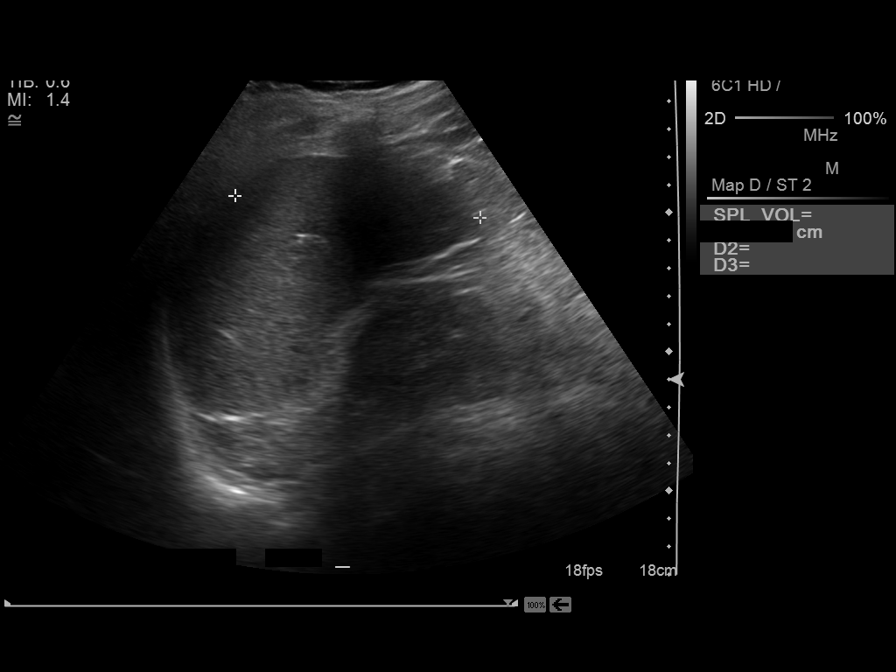
[im 3/9]
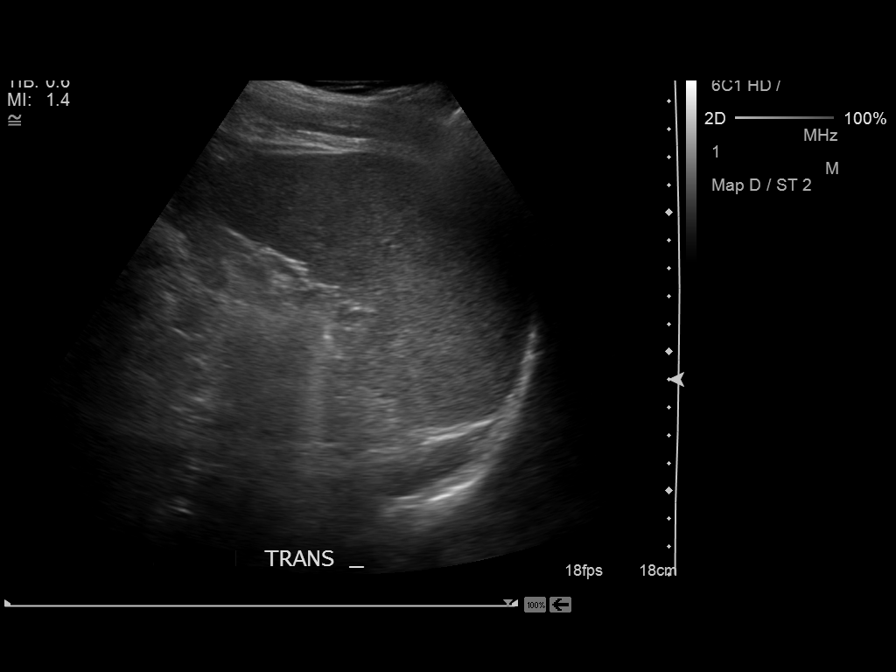
[im 4/9]
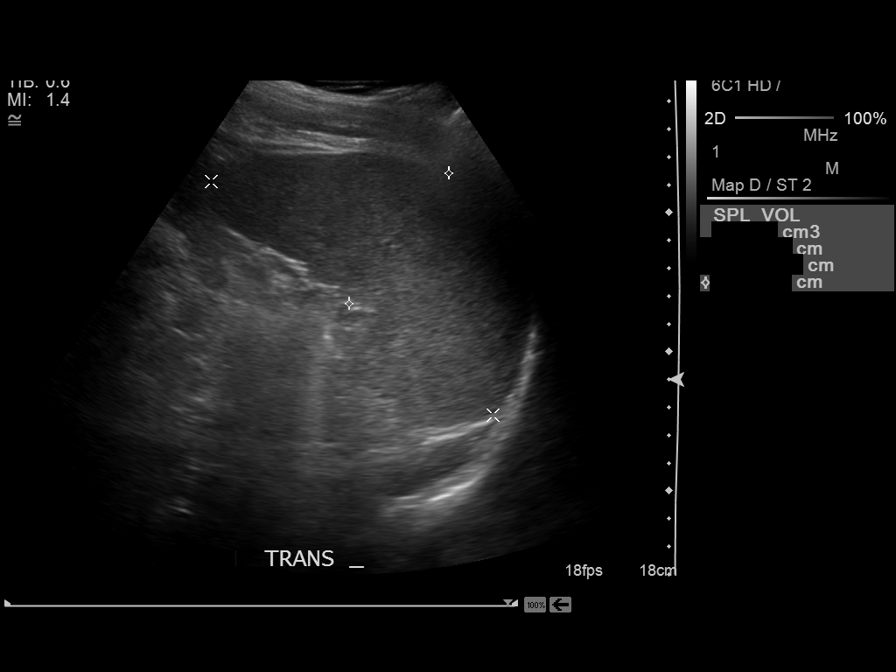
[im 5/9]
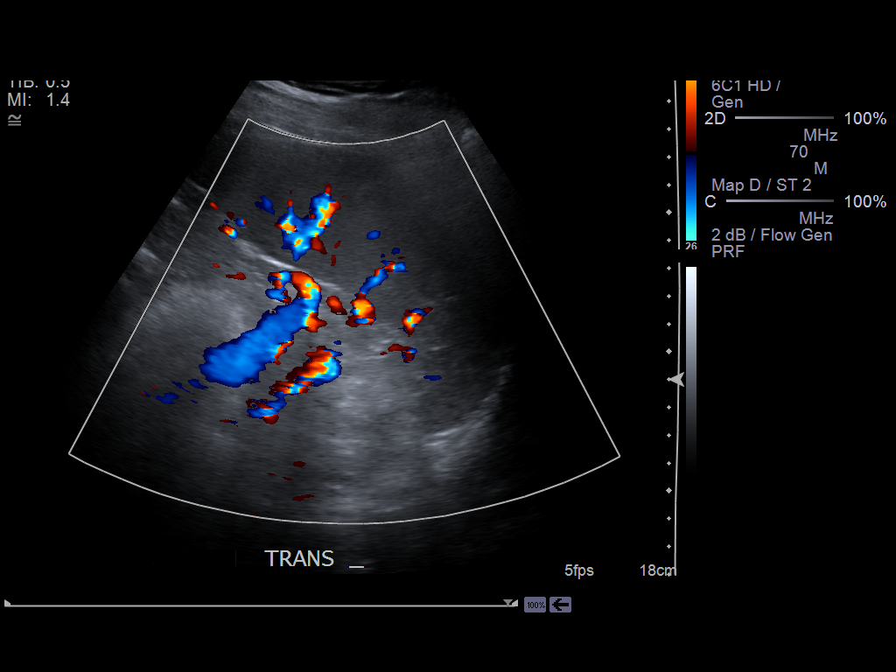
[im 6/9]
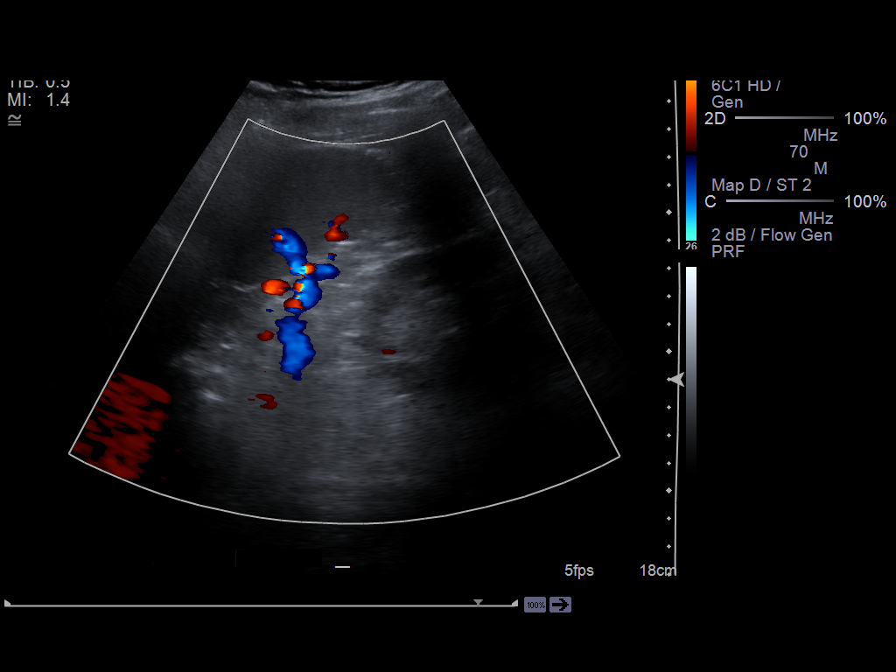
[im 7/9]
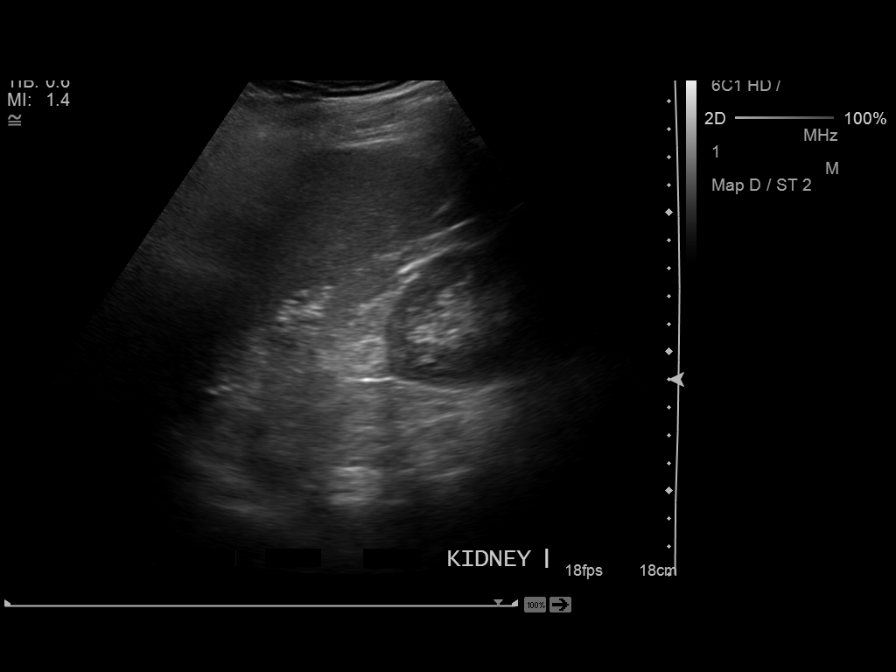
[im 8/9]
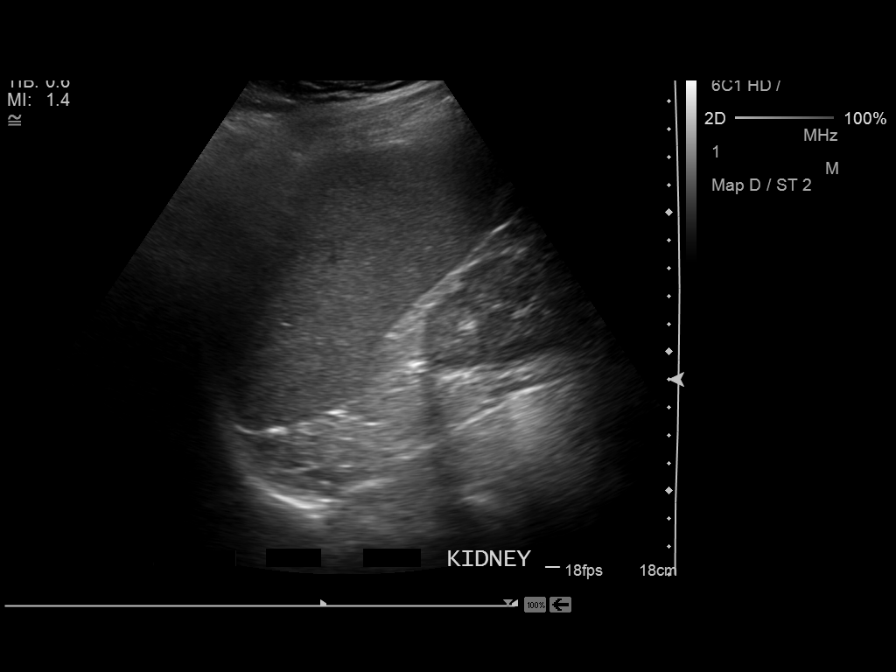
[im 9/9]
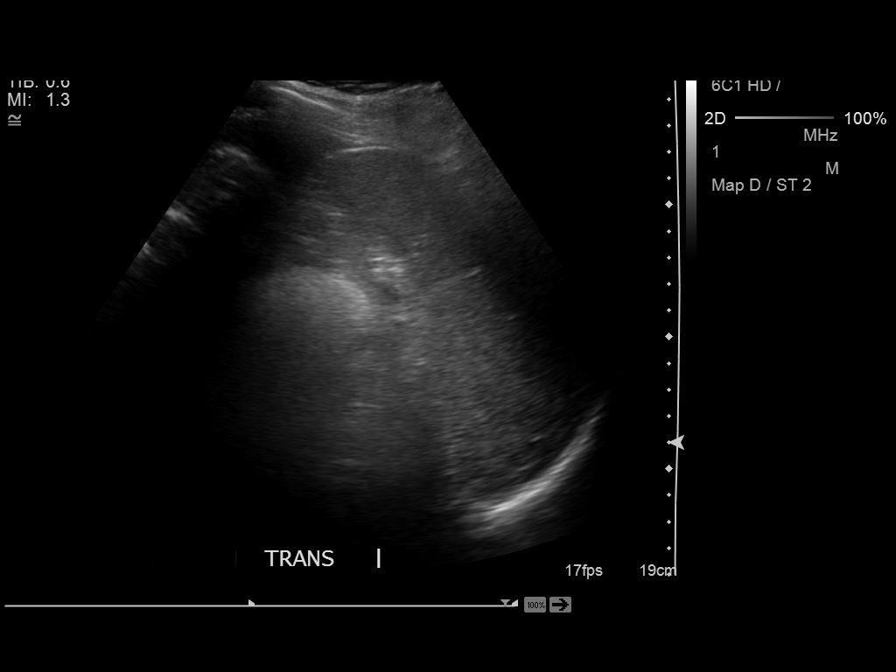

[9 of 9 positions shown; findings below may reference images not displayed]

FINDINGS: Spleen: Normal measuring 8.8 cm x 13.6 cm x 5.9 cm with a overall
volume of 359.4 cubic cm.
IMPRESSION: Normal spleen.

## 2014-01-22 ENCOUNTER — Ambulatory Visit: Payer: Self-pay | Admitting: Internal Medicine

## 2014-01-22 DIAGNOSIS — E785 Hyperlipidemia, unspecified: Secondary | ICD-10-CM | POA: Diagnosis not present

## 2014-01-22 DIAGNOSIS — Z7982 Long term (current) use of aspirin: Secondary | ICD-10-CM | POA: Diagnosis not present

## 2014-01-22 DIAGNOSIS — E119 Type 2 diabetes mellitus without complications: Secondary | ICD-10-CM | POA: Diagnosis not present

## 2014-01-22 DIAGNOSIS — Z79899 Other long term (current) drug therapy: Secondary | ICD-10-CM | POA: Diagnosis not present

## 2014-01-22 DIAGNOSIS — D72810 Lymphocytopenia: Secondary | ICD-10-CM | POA: Diagnosis not present

## 2014-01-22 DIAGNOSIS — D509 Iron deficiency anemia, unspecified: Secondary | ICD-10-CM | POA: Diagnosis not present

## 2014-01-22 DIAGNOSIS — I10 Essential (primary) hypertension: Secondary | ICD-10-CM | POA: Diagnosis not present

## 2014-01-22 DIAGNOSIS — D696 Thrombocytopenia, unspecified: Secondary | ICD-10-CM | POA: Diagnosis not present

## 2014-01-25 DIAGNOSIS — Z79899 Other long term (current) drug therapy: Secondary | ICD-10-CM | POA: Diagnosis not present

## 2014-01-25 DIAGNOSIS — D72810 Lymphocytopenia: Secondary | ICD-10-CM | POA: Diagnosis not present

## 2014-01-25 DIAGNOSIS — D696 Thrombocytopenia, unspecified: Secondary | ICD-10-CM | POA: Diagnosis not present

## 2014-01-25 DIAGNOSIS — D509 Iron deficiency anemia, unspecified: Secondary | ICD-10-CM | POA: Diagnosis not present

## 2014-02-02 DIAGNOSIS — Z23 Encounter for immunization: Secondary | ICD-10-CM | POA: Diagnosis not present

## 2014-02-12 DIAGNOSIS — D72810 Lymphocytopenia: Secondary | ICD-10-CM | POA: Diagnosis not present

## 2014-02-12 DIAGNOSIS — R718 Other abnormality of red blood cells: Secondary | ICD-10-CM | POA: Diagnosis not present

## 2014-02-12 DIAGNOSIS — D696 Thrombocytopenia, unspecified: Secondary | ICD-10-CM | POA: Diagnosis not present

## 2014-02-19 DIAGNOSIS — E1129 Type 2 diabetes mellitus with other diabetic kidney complication: Secondary | ICD-10-CM | POA: Diagnosis not present

## 2014-02-19 DIAGNOSIS — E785 Hyperlipidemia, unspecified: Secondary | ICD-10-CM | POA: Diagnosis not present

## 2014-02-19 DIAGNOSIS — R7301 Impaired fasting glucose: Secondary | ICD-10-CM | POA: Diagnosis not present

## 2014-02-19 DIAGNOSIS — I129 Hypertensive chronic kidney disease with stage 1 through stage 4 chronic kidney disease, or unspecified chronic kidney disease: Secondary | ICD-10-CM | POA: Diagnosis not present

## 2014-02-19 DIAGNOSIS — N183 Chronic kidney disease, stage 3 unspecified: Secondary | ICD-10-CM | POA: Diagnosis not present

## 2014-02-19 DIAGNOSIS — D6959 Other secondary thrombocytopenia: Secondary | ICD-10-CM | POA: Diagnosis not present

## 2014-02-19 DIAGNOSIS — D012 Carcinoma in situ of rectum: Secondary | ICD-10-CM | POA: Diagnosis not present

## 2014-02-21 ENCOUNTER — Ambulatory Visit: Payer: Self-pay | Admitting: Internal Medicine

## 2014-02-22 DIAGNOSIS — E1129 Type 2 diabetes mellitus with other diabetic kidney complication: Secondary | ICD-10-CM | POA: Diagnosis not present

## 2014-02-22 DIAGNOSIS — N2581 Secondary hyperparathyroidism of renal origin: Secondary | ICD-10-CM | POA: Diagnosis not present

## 2014-02-22 DIAGNOSIS — I10 Essential (primary) hypertension: Secondary | ICD-10-CM | POA: Diagnosis not present

## 2014-02-22 DIAGNOSIS — N183 Chronic kidney disease, stage 3 (moderate): Secondary | ICD-10-CM | POA: Diagnosis not present

## 2014-03-26 DIAGNOSIS — D696 Thrombocytopenia, unspecified: Secondary | ICD-10-CM | POA: Diagnosis not present

## 2014-03-26 DIAGNOSIS — R718 Other abnormality of red blood cells: Secondary | ICD-10-CM | POA: Diagnosis not present

## 2014-03-26 DIAGNOSIS — D72810 Lymphocytopenia: Secondary | ICD-10-CM | POA: Diagnosis not present

## 2014-04-04 ENCOUNTER — Ambulatory Visit: Payer: Self-pay | Admitting: Internal Medicine

## 2014-04-04 DIAGNOSIS — I10 Essential (primary) hypertension: Secondary | ICD-10-CM | POA: Diagnosis not present

## 2014-04-04 DIAGNOSIS — I4891 Unspecified atrial fibrillation: Secondary | ICD-10-CM | POA: Diagnosis not present

## 2014-04-04 DIAGNOSIS — H6091 Unspecified otitis externa, right ear: Secondary | ICD-10-CM | POA: Diagnosis not present

## 2014-04-10 DIAGNOSIS — J329 Chronic sinusitis, unspecified: Secondary | ICD-10-CM | POA: Diagnosis not present

## 2014-04-12 ENCOUNTER — Ambulatory Visit: Payer: Self-pay | Admitting: Internal Medicine

## 2014-04-12 DIAGNOSIS — I4891 Unspecified atrial fibrillation: Secondary | ICD-10-CM | POA: Diagnosis not present

## 2014-04-12 DIAGNOSIS — Z79899 Other long term (current) drug therapy: Secondary | ICD-10-CM | POA: Diagnosis not present

## 2014-04-12 DIAGNOSIS — E119 Type 2 diabetes mellitus without complications: Secondary | ICD-10-CM | POA: Diagnosis not present

## 2014-04-12 DIAGNOSIS — E785 Hyperlipidemia, unspecified: Secondary | ICD-10-CM | POA: Diagnosis not present

## 2014-04-12 DIAGNOSIS — I10 Essential (primary) hypertension: Secondary | ICD-10-CM | POA: Diagnosis not present

## 2014-04-12 DIAGNOSIS — D72810 Lymphocytopenia: Secondary | ICD-10-CM | POA: Diagnosis not present

## 2014-04-12 DIAGNOSIS — D696 Thrombocytopenia, unspecified: Secondary | ICD-10-CM | POA: Diagnosis not present

## 2014-04-23 ENCOUNTER — Ambulatory Visit: Payer: Self-pay | Admitting: Internal Medicine

## 2014-05-03 DIAGNOSIS — I480 Paroxysmal atrial fibrillation: Secondary | ICD-10-CM | POA: Diagnosis not present

## 2014-05-03 DIAGNOSIS — E782 Mixed hyperlipidemia: Secondary | ICD-10-CM | POA: Diagnosis not present

## 2014-05-03 DIAGNOSIS — I10 Essential (primary) hypertension: Secondary | ICD-10-CM | POA: Diagnosis not present

## 2014-05-03 DIAGNOSIS — I6523 Occlusion and stenosis of bilateral carotid arteries: Secondary | ICD-10-CM | POA: Diagnosis not present

## 2014-05-14 DIAGNOSIS — I6523 Occlusion and stenosis of bilateral carotid arteries: Secondary | ICD-10-CM | POA: Diagnosis not present

## 2014-05-21 DIAGNOSIS — D72810 Lymphocytopenia: Secondary | ICD-10-CM | POA: Diagnosis not present

## 2014-05-21 DIAGNOSIS — E785 Hyperlipidemia, unspecified: Secondary | ICD-10-CM | POA: Diagnosis not present

## 2014-05-21 DIAGNOSIS — E538 Deficiency of other specified B group vitamins: Secondary | ICD-10-CM | POA: Diagnosis not present

## 2014-05-21 DIAGNOSIS — I10 Essential (primary) hypertension: Secondary | ICD-10-CM | POA: Diagnosis not present

## 2014-05-21 DIAGNOSIS — D696 Thrombocytopenia, unspecified: Secondary | ICD-10-CM | POA: Diagnosis not present

## 2014-05-21 DIAGNOSIS — E1129 Type 2 diabetes mellitus with other diabetic kidney complication: Secondary | ICD-10-CM | POA: Diagnosis not present

## 2014-05-21 DIAGNOSIS — D509 Iron deficiency anemia, unspecified: Secondary | ICD-10-CM | POA: Diagnosis not present

## 2014-07-19 DIAGNOSIS — D509 Iron deficiency anemia, unspecified: Secondary | ICD-10-CM | POA: Diagnosis not present

## 2014-07-19 DIAGNOSIS — D72810 Lymphocytopenia: Secondary | ICD-10-CM | POA: Diagnosis not present

## 2014-07-19 DIAGNOSIS — D696 Thrombocytopenia, unspecified: Secondary | ICD-10-CM | POA: Diagnosis not present

## 2014-08-05 ENCOUNTER — Ambulatory Visit
Admit: 2014-08-05 | Disposition: A | Discharge status: Home / Self Care | Payer: Self-pay | Attending: Internal Medicine | Admitting: Internal Medicine

## 2014-08-16 ENCOUNTER — Encounter: Payer: Self-pay | Admitting: Internal Medicine

## 2014-08-16 DIAGNOSIS — D696 Thrombocytopenia, unspecified: Secondary | ICD-10-CM | POA: Diagnosis not present

## 2014-08-16 DIAGNOSIS — D72810 Lymphocytopenia: Secondary | ICD-10-CM | POA: Diagnosis not present

## 2014-08-16 LAB — OCCULT BLOOD X 1 CARD TO LAB, STOOL
OCCULT BLOOD, FECES: NEGATIVE
Occult Blood, Feces: NEGATIVE
Occult Blood, Feces: NEGATIVE

## 2014-08-20 DIAGNOSIS — I10 Essential (primary) hypertension: Secondary | ICD-10-CM | POA: Diagnosis not present

## 2014-08-20 DIAGNOSIS — E1122 Type 2 diabetes mellitus with diabetic chronic kidney disease: Secondary | ICD-10-CM | POA: Diagnosis not present

## 2014-08-20 DIAGNOSIS — E785 Hyperlipidemia, unspecified: Secondary | ICD-10-CM | POA: Diagnosis not present

## 2014-08-20 DIAGNOSIS — E1129 Type 2 diabetes mellitus with other diabetic kidney complication: Secondary | ICD-10-CM | POA: Diagnosis not present

## 2014-08-20 DIAGNOSIS — N2581 Secondary hyperparathyroidism of renal origin: Secondary | ICD-10-CM | POA: Diagnosis not present

## 2014-08-20 DIAGNOSIS — E669 Obesity, unspecified: Secondary | ICD-10-CM | POA: Diagnosis not present

## 2014-08-20 DIAGNOSIS — E039 Hypothyroidism, unspecified: Secondary | ICD-10-CM | POA: Diagnosis not present

## 2014-08-20 DIAGNOSIS — I129 Hypertensive chronic kidney disease with stage 1 through stage 4 chronic kidney disease, or unspecified chronic kidney disease: Secondary | ICD-10-CM | POA: Diagnosis not present

## 2014-08-20 DIAGNOSIS — N183 Chronic kidney disease, stage 3 (moderate): Secondary | ICD-10-CM | POA: Diagnosis not present

## 2014-08-23 ENCOUNTER — Ambulatory Visit
Admit: 2014-08-23 | Disposition: A | Discharge status: Home / Self Care | Payer: Self-pay | Attending: Internal Medicine | Admitting: Internal Medicine

## 2014-08-23 DIAGNOSIS — E1129 Type 2 diabetes mellitus with other diabetic kidney complication: Secondary | ICD-10-CM | POA: Diagnosis not present

## 2014-08-23 DIAGNOSIS — N2581 Secondary hyperparathyroidism of renal origin: Secondary | ICD-10-CM | POA: Diagnosis not present

## 2014-08-23 DIAGNOSIS — N183 Chronic kidney disease, stage 3 (moderate): Secondary | ICD-10-CM | POA: Diagnosis not present

## 2014-08-23 DIAGNOSIS — I10 Essential (primary) hypertension: Secondary | ICD-10-CM | POA: Diagnosis not present

## 2014-09-15 NOTE — Consult Note (Signed)
PATIENT NAME:  Jonathan [name], Jonathan A MR#:  [id number] DATE OF BIRTH:  1946-01-22  DATE OF CONSULTATION:  06/06/2011  REFERRING PHYSICIAN:   CONSULTING PHYSICIAN:  Jonathan MillardAlexander Acquanetta Cabanilla, MD  PRIMARY CARE PHYSICIAN: Jonathan MossMark Crissman, MD   CARDIOLOGIST: Jonathan HookerBruce Kowalski, MD   CHIEF COMPLAINT: Rapid heartbeat.   HISTORY OF PRESENT ILLNESS: The patient is a 69 year old gentleman admitted with a viral syndrome and atrial fibrillation with rapid ventricular response. The patient has had a history of atrial fibrillation, failed maintenance on Multaq and Rythmol. He underwent catheter ablation by Dr. Gerre PebblesKevin Thomas at [facility] approximately 15 months ago. The patient has been in sinus rhythm until this weekend. On 06/04/2011, the patient had the acute onset of fever, chills, nausea, and myalgias consistent with a viral syndrome. The patient had decreased p.o. intake. On 06/05/2011, the patient noted his heart was racing and on 06/06/2011 he presented to [facility] Emergency Room early in a.m. in atrial fibrillation with a rapid ventricular response. Admission labs were notable for mildly elevated BUN and creatinine of 31 and 1.51 and a magnesium level of 1.4. Troponin was less than 0.02. The patient was given 2 mg of intravenous magnesium and started on a Cardizem drip.   PAST MEDICAL HISTORY:  1. Atrial fibrillation, status post catheter ablation.  2. Hypertension.  3. Hypothyroidism.  4. Diabetes.   MEDICATIONS:  1. Aspirin 81 mg daily.  2. Pradaxa 150 mg b.i.d.  3. Lipitor 40 mg daily.  4. Lisinopril 20 mg daily.  5. Metoprolol 50 mg b.i.d.  6. Metformin 500 mg b.i.d.  7. Januvia 100 mg 1 daily.  8. Levothyroxine 125 mcg daily.   SOCIAL HISTORY: The patient is retired. He is married. Lives with his wife. He denies tobacco abuse.   FAMILY HISTORY: No immediate family history of coronary artery disease or myocardial infarction.    PHYSICAL EXAMINATION:   VITAL SIGNS: Blood pressure 109/68, pulse 85.   HEENT:  Pupils equal and reactive to light and accommodation.   NECK: Supple without thyromegaly.   LUNGS: Clear.   CARDIOVASCULAR: Normal JVP. Normal PMI. Irregularly irregular rhythm. Normal S1, S2. No appreciable gallop, murmur, or rub.   ABDOMEN: Soft, nontender. Pulses were intact bilaterally.   MUSCULOSKELETAL: Normal muscle tone.   NEUROLOGIC: The patient is alert and oriented x3. Motor and sensory are both grossly intact.   IMPRESSION: This is a 69 year old gentleman with atrial fibrillation status post catheter ablation who was admitted following a viral syndrome with recurrence of atrial fibrillation, rapid ventricular response.   RECOMMENDATIONS:  1. Agree with Cardizem drip.  2. Continue Primaxin 150 mg b.i.d.  3. Replete electrolytes  4. Review 2-D echocardiogram.    5. If the patient remains in atrial fibrillation despite Cardizem drip, IV fluid and electrolyte repletion, then would consider cardioversion after reviewing echocardiogram.   ____________________________ Jonathan MillardAlexander Lezli Danek, MD ap:drc D: 06/06/2011 10:02:24 ET T: 06/06/2011 11:07:09 ET JOB#: [id number]  cc: Jonathan MillardAlexander Jahnessa Vanduyn, MD, <Dictator> Jonathan MillardALEXANDER Emanuelle Bastos MD ELECTRONICALLY SIGNED 06/17/2011 12:37

## 2014-09-15 NOTE — H&P (Signed)
PATIENT NAME:  Jonathan Jonathan [name], Jonathan Jonathan [name] MR#:  [id number] DATE OF BIRTH:  10-07-1945  DATE OF ADMISSION:  06/06/2011  PRIMARY CARE PHYSICIAN: Jonathan MossMark Crissman, MD   CARDIOLOGIST: Jonathan HookerBruce Kowalski, MD   CHIEF COMPLAINT: Palpitations.  HISTORY OF PRESENT ILLNESS: Jonathan Jonathan [name] is Jonathan [name] pleasant 69 year old Caucasian gentleman with history of chronic atrial fibrillation, hypertension, and diabetes who comes to the Emergency Room accompanied by his wife after he started having palpitations last night while watching the football game. The patient reported that on Friday he came down with flulike symptoms, high fever, rigors, and severe myalgias. He took over-the-counter Naprosyn and felt better; however, yesterday while watching the football game he started feeling "funny" and noticed that he was having palpitations. He checked his heart rate and it was around 150's. He could not rest much at home and decided to come to the Emergency Room around 4:30 Jonathan [name].m. and was found to be in rapid atrial flutter, sinus tach with heart rate in the 150's. The patient was given IV Cardizem. One time his heart rate slowed down into the 90's. It is back into 120's, 130's, and is going to be started on IV Cardizem. He is being admitted for further evaluation and management.  PAST MEDICAL HISTORY: 1. Chronic atrial fibrillation with intermittent sinus rhythm status post ablation about Jonathan [name] year ago at Jonathan Jonathan [name]. 2. Hypertension. 3. Hyperlipidemia. 4. Type II diabetes. 5. Hypothyroidism.  ALLERGIES: No known drug allergies.  FAMILY HISTORY: Father died of lung cancer at 3463. Mother died of enlarged spleen and MI at age 69.  SOCIAL HISTORY: Married. Occasionally drinks alcohol and caffeine. Denies tobacco use.  MEDICATIONS: 1. Aspirin 81 mg every other day. 2. Januvia 100 mg daily. 3. Levothyroxine 125 mg p.o. daily. 4. Lipitor 40 mg daily. 5. Lisinopril 20 mg daily. 6. Metformin 1000 mg b.i.d. 7. Metoprolol 50 mg b.i.d. 8. Pradaxa 150 mg  b.i.d.  REVIEW OF SYSTEMS: CONSTITUTIONAL: Positive for fatigue and weakness. No fever. EYES: No blurred or double vision. ENT: No tinnitus, ear pain, or hearing loss. RESPIRATORY: No cough, wheeze, hemoptysis, or COPD. CARDIOVASCULAR: No chest pain. Positive for palpitations and arrhythmia. GI: No nausea, vomiting, diarrhea, or abdominal pain. GU: No dysuria or hematuria. ENDOCRINE: No polyuria or nocturia. HEMATOLOGY: No anemia or easy bruising. SKIN: No acne or rash. MUSCULOSKELETAL: No arthritis. NEUROLOGIC: No CVA or TIA. PSYCH: No anxiety or depression. All other systems reviewed and negative.  PHYSICAL EXAMINATION:  GENERAL: The patient is awake, alert, and oriented x3 not in acute distress.  VITAL SIGNS: Afebrile, pulse 120 to 122, atrial fibrillation, blood pressure 117/68, sats 99% on 2 liters.  HEENT: Atraumatic, normocephalic. Pupils equal, round, and reactive to light and accommodation. Extraocular movements intact. Oral mucosa is moist.  NECK: Supple. No JVD. No carotid bruit.   RESPIRATORY: Clear to auscultation bilaterally. No rales, rhonchi, respiratory distress, or labored breathing.  CARDIOVASCULAR: Tachycardia present. Heart sounds are normal. No murmur heard. PMI not lateralized.   CHEST: Nontender.  EXTREMITIES: Good pedal pulses. Good femoral pulses. No lower extremity edema.  ABDOMEN: Soft, benign, nontender. No organomegaly. Positive bowel sounds.  NEUROLOGIC: Grossly intact cranial nerves II through XII. No motor or sensory deficit.  PSYCH: The patient is awake, alert, and oriented x3.  SKIN: Warm and dry.  LABORATORY, DIAGNOSTIC, AND RADIOLOGICAL DATA: EKG shows atrial flutter. Repeat EKG shows atrial fibrillation with heart rate in the 90's. Influenza Jonathan [name] plus B negative. Chest x-ray no acute cardiopulmonary abnormality. Cardiac enzymes, first set, negative. Glucose  183, BUN 31, creatinine 1.51, sodium 141, potassium 4.2, chloride 107, bicarb 20, calcium 8.3.  LFTs within normal limits. CBC within normal limits except platelet count of 135. Magnesium 1.4. TSH 5.09. B-type Natriuretic Peptide 2223. D-dimer 0.26.   ASSESSMENT: 69 year old Jonathan Jonathan [name] with: 1. Acute on chronic rapid atrial fibrillation, likely precipitated from flulike symptoms which the patient had over the weekend. He had high-grade fever, rigors, and severe myalgias. The patient has been in sinus rhythm intermittently per cardio notes. He has been on Multaq and Rythmol in the past. The patient was given IV bolus Cardizem and will be started on Cardizem drip. Continue Pradaxa. Will also check cardiac enzymes x3. Replace magnesium. Jonathan Jonathan [name] is to see the patient. Case was discussed with him. Will order echo of the heart.  2. Hypomagnesemia. The patient received one dose of IV 2 gram mag sulfate. Will start him on p.o. oral magnesium replacement. 3. Type II diabetes. Continue Januvia. I will hold off on metformin given elevated creatinine. Will give him some IV fluids. Monitor creatinine and resume metformin once creatinine is in Jonathan [name] reasonable range. Will also order some sliding scale insulin. 4. Hyperlipidemia. Resume the patient's Lipitor.  5. Hypothyroidism with elevated TSH. Will adjust the dose of Synthroid. Will increase it up to 137 mcg daily. 6. DVT prophylaxis. The patient is already on aspirin and Pradaxa.  Plan for admission was discussed with the patient and family. Case was also discussed with Jonathan Jonathan [name] from Cardiology who will see the patient in consultation. Further workup according to the patient's clinical course.  CRITICAL TIME SPENT: 55 minutes.   ____________________________ Jonathan Hail Allena Katz, MD sap:drc D: 06/06/2011 08:13:53 ET T: 06/06/2011 09:28:42 ET JOB#: [id number]  cc: Jonathan Jonathan [name]. Allena Katz, MD, <Dictator> Jonathan Sizer, MD Jonathan Blinks, MD Jonathan Ora MD ELECTRONICALLY SIGNED 06/08/2011 15:24

## 2014-09-15 NOTE — Discharge Summary (Signed)
PATIENT NAME:  Bonita QuinGREESON, Jonathan A MR#:  [id number] DATE OF BIRTH:  Aug 09, 1945  DATE OF ADMISSION:  06/06/2011 DATE OF DISCHARGE:  06/07/2011  ADMITTING DIAGNOSIS: Atrial fibrillation, rapid ventricular response.   DISCHARGE DIAGNOSES: 1. Atrial fibrillation, rapid ventricular response likely due to stress due to recent upper respiratory infection status post cardioversion.  2. Hypomagnesemia.  3. History of hypertension.  4. Hyperlipidemia.  5. Diabetes mellitus.  6. Hypothyroidism.    DISCHARGE CONDITION: Stable.   DISCHARGE MEDICATIONS: Patient is to resume his outpatient medications which are:  1. Lipitor 40 mg p.o. daily.  2. Lisinopril 20 mg daily. 3. Metoprolol 50 mg twice daily.  4. Pradaxa 150 mg p.o. twice daily. 5. Metformin 500 mg 2 tablets twice daily.  6. Levothyroxine 125 mcg p.o. daily. 7. Januvia 100 mg p.o. daily. 8. Aspirin 81 mg p.o. daily.   HOME OXYGEN: None.   DIET: 1800 ADA, low fat, low cholesterol, 2 grams salt.   PHYSICAL ACTIVITY LIMITATIONS: As tolerated.   FOLLOW UP: Follow-up appointment with Dr. Dossie Arbourrissman in two days after discharge as well as Dr. Gwen PoundsKowalski in two days after discharge.  CONSULTANTS:  1. Dr. Darrold JunkerParaschos. 2. Dr. Gwen PoundsKowalski.  PROCEDURE: Cardioversion procedure 06/07/2011 by Dr. Gwen PoundsKowalski. Anesthesiologist: Dr. Pernell DupreAdams. Successful electrical cardioversion to normal sinus rhythm from atrial flutter with 100 joules of biphasic current with no complications.   HISTORY OF PRESENT ILLNESS: Patient is 69 year old Caucasian male with history of diabetes, hypertension, hyperlipidemia who presented to the hospital with complaints of palpitations. Please refer to Dr. Eliane DecreePatel's admission note on 06/06/2011. Apparently patient had recently flulike symptoms such as high fevers, rigors as well as severe myalgias. He took over-the-counter Naprosyn and felt somewhat better, however yesterday, 06/06/2011, he started feeling somewhat funny and noted to have  palpitations. He checked his heart rate and found it to be around 150. He was not able to rest at home and palpitations did not subside so he presented to the Emergency Room approximately at 4:30 a.m. and was noted to be in atrial flutter and sinus tach with heart rate of 150. He was given IV Cardizem as well as IV fluids and was started on IV Cardizem drip. He was admitted to Critical Care Unit for further management. On arrival to the Emergency Room, patient was afebrile. His pulse was 120 to 122, atrial fibrillation, blood pressure 117/68, saturation 99% on 2 liters of oxygen through nasal cannula. Physical exam revealed tachycardia, otherwise unremarkable physical examination.   LABORATORY, DIAGNOSTIC AND RADIOLOGICAL DATA: Patient's lab data 06/06/2011 showed B-type natriuretic peptide was elevated to 2223, glucose 183, BUN and creatinine were 31 and 1.51 respectively, magnesium level was low at 1.4. Liver enzymes were normal. Cardiac enzymes, first set, as well as subsequent two sets were within normal limits. Patient's initial TSH done on 06/06/2011 was elevated at 5.09. CBC was within normal limits except platelet count was slightly low at 135. D-dimer was normal at 0.26. Influenza test was negative. Urinalysis was unremarkable. Patient's initial EKG showed sinus tachycardia at 126 beats per minute. Repeated EKG showed atrial flutter with variable AV block at rate of 90s. Initial ventricular rate was 156.   HOSPITAL COURSE: Patient was, as mentioned above, admitted to Critical Care Unit and placed IV fluids as well as Cardizem drip. With this conservative therapy he improved significantly. His heart rate was improved to 70s to 80s on 06/07/2011, however, he remained in atrial flutter but not in sinus rhythm. Cardiology consultation was obtained and Dr. Darrold JunkerParaschos  as well as Dr. Gwen Pounds saw patient in consultation. Dr. Gwen Pounds felt that patient would benefit from cardioversion which patient proceeded to  on 06/07/2011. Cardioversion was accomplished with 100 joules of biphasic current and patient was cardioverted to normal sinus rhythm from atrial flutter. Post cardioversion he did fine, did not complain of any significant discomfort, his vitals were stable and he is being discharged home with above-mentioned medications and follow up. His vitals on the day of discharge: Temperature 98, pulse 71 to 76, respiration rate 18 to 22, blood pressure 104 to 119 systolic and 50s to 60s diastolic, oxygen saturation 98% to 99% on room air at rest. It was felt that patient's atrial flutter, rapid ventricular response, atrial fibrillation was very likely related to his current disease, respiratory infection as well as dehydration as well as hypomagnesemia. As mentioned above, patient had mild renal insufficiency with BUN and creatinine were elevated to 31 and 1.51 respectively. With IV fluid administration, patient's BUN and creatinine normalized. On the day of discharge, 06/07/2011,  patient's BUN and creatinine were 17 and 1.18 respectively. His magnesium level was also low and that was also supplemented.   In regards to hypertension, diabetes, hyperlipidemia as well as hypothyroidism, patient is to continue his outpatient medications. As mentioned above patient's TSH was somewhat high initially on the day of admission, however, repeated TSH done on 06/07/2011 was within normal limits at 3.7. Patient was advised to continue his outpatient medications. Patient is being discharged with above-mentioned medications and follow up.   TIME SPENT: 40 minutes.  ____________________________ Katharina Caper, MD rv:cms D: 06/07/2011 17:49:11 ET T: 06/09/2011 06:43:20 ET JOB#: [id number]  cc: Katharina Caper, MD, <Dictator> Steele Sizer, MD Lamar Blinks, MD Drevion Offord MD ELECTRONICALLY SIGNED 07/04/2011 66:44

## 2014-09-15 NOTE — Consult Note (Signed)
Brief Consult Note: Diagnosis: Recurrent AF in setting of viral syndrome.   Patient was seen by consultant.   Consult note dictated.   Comments: REC  Agree with current therapy, dilt drip, replete electrolytes and fluids, review echo, if patient remains in AF despite conservative measures then proceed with cardioversion in am ( NPO ).  Electronic Signatures: Marcina MillardParaschos, Sarabella Caprio (MD)  (Signed 13-Jan-13 10:04)  Authored: Brief Consult Note   Last Updated: 13-Jan-13 10:04 by Marcina MillardParaschos, Mosi Hannold (MD)

## 2014-10-25 DIAGNOSIS — I10 Essential (primary) hypertension: Secondary | ICD-10-CM | POA: Diagnosis not present

## 2014-10-25 DIAGNOSIS — I6523 Occlusion and stenosis of bilateral carotid arteries: Secondary | ICD-10-CM | POA: Diagnosis not present

## 2014-10-25 DIAGNOSIS — E782 Mixed hyperlipidemia: Secondary | ICD-10-CM | POA: Diagnosis not present

## 2014-10-25 DIAGNOSIS — I480 Paroxysmal atrial fibrillation: Secondary | ICD-10-CM | POA: Diagnosis not present

## 2014-11-04 ENCOUNTER — Other Ambulatory Visit: Payer: Self-pay | Admitting: Unknown Physician Specialty

## 2014-11-05 NOTE — Telephone Encounter (Signed)
Recheck further refills

## 2014-11-06 DIAGNOSIS — D72810 Lymphocytopenia: Secondary | ICD-10-CM | POA: Diagnosis not present

## 2014-11-06 DIAGNOSIS — D696 Thrombocytopenia, unspecified: Secondary | ICD-10-CM | POA: Diagnosis not present

## 2014-12-03 DIAGNOSIS — IMO0002 Reserved for concepts with insufficient information to code with codable children: Secondary | ICD-10-CM

## 2014-12-03 DIAGNOSIS — N183 Chronic kidney disease, stage 3 unspecified: Secondary | ICD-10-CM

## 2014-12-03 DIAGNOSIS — E039 Hypothyroidism, unspecified: Secondary | ICD-10-CM | POA: Insufficient documentation

## 2014-12-03 DIAGNOSIS — E669 Obesity, unspecified: Secondary | ICD-10-CM | POA: Insufficient documentation

## 2014-12-03 DIAGNOSIS — E1322 Other specified diabetes mellitus with diabetic chronic kidney disease: Secondary | ICD-10-CM

## 2014-12-03 DIAGNOSIS — D696 Thrombocytopenia, unspecified: Secondary | ICD-10-CM | POA: Insufficient documentation

## 2014-12-03 DIAGNOSIS — I129 Hypertensive chronic kidney disease with stage 1 through stage 4 chronic kidney disease, or unspecified chronic kidney disease: Secondary | ICD-10-CM

## 2014-12-03 DIAGNOSIS — E785 Hyperlipidemia, unspecified: Secondary | ICD-10-CM

## 2014-12-03 DIAGNOSIS — E538 Deficiency of other specified B group vitamins: Secondary | ICD-10-CM | POA: Insufficient documentation

## 2014-12-03 DIAGNOSIS — E1365 Other specified diabetes mellitus with hyperglycemia: Secondary | ICD-10-CM

## 2014-12-03 DIAGNOSIS — E1169 Type 2 diabetes mellitus with other specified complication: Secondary | ICD-10-CM | POA: Insufficient documentation

## 2014-12-11 ENCOUNTER — Ambulatory Visit (INDEPENDENT_AMBULATORY_CARE_PROVIDER_SITE_OTHER): Payer: Medicare Other | Admitting: Unknown Physician Specialty

## 2014-12-11 ENCOUNTER — Encounter: Payer: Self-pay | Admitting: Unknown Physician Specialty

## 2014-12-11 VITALS — BP 132/79 | HR 74 | Temp 98.3°F | Ht 69.9 in | Wt 222.4 lb

## 2014-12-11 DIAGNOSIS — E1365 Other specified diabetes mellitus with hyperglycemia: Principal | ICD-10-CM

## 2014-12-11 DIAGNOSIS — E1322 Other specified diabetes mellitus with diabetic chronic kidney disease: Secondary | ICD-10-CM

## 2014-12-11 DIAGNOSIS — IMO0002 Reserved for concepts with insufficient information to code with codable children: Secondary | ICD-10-CM

## 2014-12-11 DIAGNOSIS — E1329 Other specified diabetes mellitus with other diabetic kidney complication: Secondary | ICD-10-CM | POA: Diagnosis not present

## 2014-12-11 DIAGNOSIS — N183 Chronic kidney disease, stage 3 unspecified: Secondary | ICD-10-CM

## 2014-12-11 LAB — BAYER DCA HB A1C WAIVED: HB A1C: 7.8 % — AB (ref <7.0)

## 2014-12-11 MED ORDER — LINAGLIPTIN 5 MG PO TABS: 5.0000 mg | ORAL_TABLET | Freq: Every day | ORAL | Status: DC

## 2014-12-11 MED ORDER — CANAGLIFLOZIN 300 MG PO TABS: 300.0000 mg | ORAL_TABLET | Freq: Every day | ORAL | Status: DC

## 2014-12-11 NOTE — Assessment & Plan Note (Signed)
Hgb A1C is 7.7.  His diet is excellent.  Has a very active job in which he is moving all the time.  Noted last GFR was 2773 with reviewing nephrology notes. I think increasing Invokanna to 300 is reasonable.  Seeing nephrology in 2 months. Maybe we can restart Metformin

## 2014-12-11 NOTE — Progress Notes (Signed)
BP 132/79 mmHg  Pulse 74  Temp(Src) 98.3 F (36.8 C)  Ht 5' 9.9" (1.775 m)  Wt 222 lb 6.4 oz (100.88 kg)  BMI 32.02 kg/m2  SpO2 96%   Subjective:    Patient ID: Jonathan [name], Jonathan [name]    DOB: [birth year], 69 y.o.   MRN: [id number]  HPI: Jonathan [name] is a 69 y.o. Jonathan [name]  Chief Complaint  Patient presents with  . Diabetes  . Hypertension  . Hyperlipidemia    Relevant past medical, surgical, family and social history reviewed and updated as indicated. Interim medical history since our last visit reviewed. Allergies and medications reviewed and updated.   DIABETES Hypoglycemic episodes:  no  H8  Visual disturbance:  no  R2  Chest pain:  no  R4  Paresthesias:  no  R10  Glucose Monitoring:  yes   Accucheck frequency:  "not as often as I should"  "all over the place" HGB A1C: 7.7 3 months ago D1  Blood Pressure Monitoring:  a few times a week 120-130s  60-70s H3 Foot Exam: Foot Exam Done on 05/21/14 P1 Aspirin:  Aspirin Therapy Not applicable on 09/13/12 (Taking aspirin  )    Review of Systems  Constitutional: Negative.   HENT: Negative.   Eyes: Negative.   Respiratory: Negative.   Cardiovascular: Negative.   Gastrointestinal: Negative.   Endocrine: Negative.   Genitourinary: Negative.   Skin: Negative.   Allergic/Immunologic: Negative.   Neurological: Negative.   Hematological: Negative.   Psychiatric/Behavioral: Negative.     Per HPI unless specifically indicated above     Objective:    BP 132/79 mmHg  Pulse 74  Temp(Src) 98.3 F (36.8 C)  Ht 5' 9.9" (1.775 m)  Wt 222 lb 6.4 oz (100.88 kg)  BMI 32.02 kg/m2  SpO2 96%  Wt Readings from Last 3 Encounters:  12/11/14 222 lb 6.4 oz (100.88 kg)  12/03/14 222 lb (100.699 kg)    Physical Exam  Constitutional: He is oriented to person, place, and time. He appears well-developed and well-nourished. No distress.  HENT:  Head: Normocephalic and atraumatic.  Eyes: Conjunctivae and lids are normal. Right  eye exhibits no discharge. Left eye exhibits no discharge. No scleral icterus.  Cardiovascular: Normal rate, regular rhythm and normal heart sounds.   Pulmonary/Chest: Effort normal. No respiratory distress.  Abdominal: Normal appearance and bowel sounds are normal. He exhibits no distension. There is no splenomegaly or hepatomegaly. There is no tenderness.  Musculoskeletal: Normal range of motion.  Neurological: He is alert and oriented to person, place, and time.  Skin: Skin is intact. No rash noted. No pallor.  Psychiatric: He has a normal mood and affect. His behavior is normal. Judgment and thought content normal.   Reviewed notes from nephrology    Assessment & Plan:   Problem List Items Addressed This Visit      Endocrine   Uncontrolled secondary diabetes mellitus with stage 3 CKD (GFR 30-59) - Primary    Hgb A1C is 7.7.  His diet is excellent.  Has a very active job in which he is moving all the time.  Noted last GFR was 17 with reviewing nephrology notes. I think increasing Invokanna to 300 is reasonable.  Seeing nephrology in 2 months. Maybe we can restart Metformin      Relevant Medications   canagliflozin (INVOKANA) 300 MG TABS tablet   linagliptin (TRADJENTA) 5 MG TABS tablet   Other Relevant Orders   Bayer DCA Hb A1c Waived  Genitourinary   CKD (chronic kidney disease), stage III   Relevant Orders   Bayer DCA Hb A1c Waived       Follow up plan: Return in about 3 months (around 03/13/2015) for DM.   Will need Lipid, Hbg A1C, Microalbumin, Uric Acid, CMP

## 2015-01-14 ENCOUNTER — Inpatient Hospital Stay: Payer: Self-pay | Attending: Family Medicine

## 2015-01-14 ENCOUNTER — Inpatient Hospital Stay: Payer: Self-pay | Admitting: Family Medicine

## 2015-01-29 ENCOUNTER — Encounter: Payer: Self-pay | Admitting: Family Medicine

## 2015-01-29 DIAGNOSIS — D696 Thrombocytopenia, unspecified: Secondary | ICD-10-CM | POA: Diagnosis not present

## 2015-01-29 DIAGNOSIS — D72810 Lymphocytopenia: Secondary | ICD-10-CM | POA: Diagnosis not present

## 2015-01-30 ENCOUNTER — Other Ambulatory Visit: Payer: Self-pay | Admitting: Unknown Physician Specialty

## 2015-01-31 ENCOUNTER — Inpatient Hospital Stay: Payer: Medicare Other | Admitting: Family Medicine

## 2015-02-07 ENCOUNTER — Inpatient Hospital Stay: Payer: Medicare Other | Attending: Family Medicine | Admitting: Family Medicine

## 2015-02-07 ENCOUNTER — Encounter: Payer: Self-pay | Admitting: Family Medicine

## 2015-02-07 VITALS — BP 146/74 | HR 77 | Temp 96.7°F | Resp 18 | Wt 225.5 lb

## 2015-02-07 DIAGNOSIS — Z87891 Personal history of nicotine dependence: Secondary | ICD-10-CM | POA: Insufficient documentation

## 2015-02-07 DIAGNOSIS — D696 Thrombocytopenia, unspecified: Secondary | ICD-10-CM | POA: Insufficient documentation

## 2015-02-07 DIAGNOSIS — Z7982 Long term (current) use of aspirin: Secondary | ICD-10-CM | POA: Diagnosis not present

## 2015-02-07 DIAGNOSIS — J45909 Unspecified asthma, uncomplicated: Secondary | ICD-10-CM | POA: Diagnosis not present

## 2015-02-07 DIAGNOSIS — Z79899 Other long term (current) drug therapy: Secondary | ICD-10-CM | POA: Diagnosis not present

## 2015-02-07 DIAGNOSIS — E119 Type 2 diabetes mellitus without complications: Secondary | ICD-10-CM | POA: Insufficient documentation

## 2015-02-07 DIAGNOSIS — I10 Essential (primary) hypertension: Secondary | ICD-10-CM | POA: Insufficient documentation

## 2015-02-07 DIAGNOSIS — E785 Hyperlipidemia, unspecified: Secondary | ICD-10-CM | POA: Insufficient documentation

## 2015-02-07 NOTE — Progress Notes (Signed)
[facility]  [phone]  [phone]) [phone]     Jonathan [name] DOB: 1945-12-16  MR#: [id number]  AOZ#:[id number]  Patient Care Team: Gabriel Cirri, NP as PCP - General (Nurse Practitioner)  CHIEF COMPLAINT:  Chief Complaint  Patient presents with  . Follow-up    no complaints.   Thrombocytopenia since 08/2012, ana, hep c, and hiv neg in 12/2013, spleen normal on u/s 12/2013. Microcytosis since 08/2012, normal  2013.  8/17 iron sat normal, ferritin low normal , and 01/2013 3 stools negative .  INTERVAL HISTORY:  Patient is here for further evaluation and treatment consideration regarding thrombocytopenia. He was last seen by Dr. Lorre Nick in March 2016. At that time his platelet level was approximately 100,000. Previous workup by Dr. Lorre Nick included serologies which were all negative, ultrasound of the spleen which was normal, as well as stool guaiacs which were also negative. Patient has significant past medical history of heart disease for which he was previously on Pradaxa. Pradaxa was stopped due to bruising and 81 mg of aspirin was started every other day. His most recent labs were drawn through [facility] and platelets reported as 98,000. He overall feels very well. He denies any acute bleeding but continues with easy bruising.  REVIEW OF SYSTEMS:   Review of Systems  Constitutional: Negative for fever, chills, weight loss, malaise/fatigue and diaphoresis.  HENT: Negative for congestion, ear discharge, ear pain, hearing loss, nosebleeds, sore throat and tinnitus.   Eyes: Negative for blurred vision, double vision, photophobia, pain, discharge and redness.  Respiratory: Negative for cough, hemoptysis, sputum production, shortness of breath, wheezing and stridor.   Cardiovascular: Negative for chest pain, palpitations, orthopnea, claudication, leg swelling and PND.  Gastrointestinal: Negative for heartburn, nausea, vomiting, abdominal pain, diarrhea, constipation, blood  in stool and melena.  Genitourinary: Negative.   Musculoskeletal: Negative.   Skin: Negative.   Neurological: Negative for dizziness, tingling, focal weakness, seizures, weakness and headaches.  Endo/Heme/Allergies: Bruises/bleeds easily.  Psychiatric/Behavioral: Negative for depression. The patient is not nervous/anxious and does not have insomnia.     As per HPI. Otherwise, a complete review of systems is negatve.   PAST MEDICAL HISTORY: Past Medical History  Diagnosis Date  . Hypertension   . Asthma   . Hyperlipidemia   . Diabetes mellitus without complication   . Thrombocytopenia     PAST SURGICAL HISTORY: Past Surgical History  Procedure Laterality Date  . Heart ablation    . Knee surgery Bilateral     FAMILY HISTORY Family History  Problem Relation Age of Onset  . Hypertension Mother   . Cancer Father     lung  . Lung disease Father   . Hypertension Father     GYNECOLOGIC HISTORY:  No LMP for male patient.     ADVANCED DIRECTIVES:    HEALTH MAINTENANCE: Social History  Substance Use Topics  . Smoking status: Former Games developer  . Smokeless tobacco: Former Neurosurgeon  . Alcohol Use: 1.8 oz/week    0 Standard drinks or equivalent, 3 Cans of beer per week     Colonoscopy:  PAP:  Bone density:  Lipid panel:  No Known Allergies  Current Outpatient Prescriptions  Medication Sig Dispense Refill  . aspirin 81 MG tablet Take 81 mg by mouth daily.    Marland Kitchen atorvastatin (LIPITOR) 40 MG tablet Take 40 mg by mouth at bedtime.    . fluticasone (FLONASE) 50 MCG/ACT nasal spray Place into both nostrils daily.    Marland Kitchen glucose blood  test strip 1 each by Other route as needed for other. Use as instructed    . INVOKANA 300 MG TABS tablet Take 1 tablet by mouth daily.  6  . levothyroxine (SYNTHROID, LEVOTHROID) 137 MCG tablet TAKE ONE TABLET BY MOUTH ONCE DAILY 90 tablet 0  . linagliptin (TRADJENTA) 5 MG TABS tablet Take 1 tablet (5 mg total) by mouth daily. 30 tablet 6  .  lisinopril (PRINIVIL,ZESTRIL) 20 MG tablet TAKE ONE TABLET BY MOUTH ONCE DAILY 90 tablet 0  . magnesium oxide (MAG-OX) 400 MG tablet Take 400 mg by mouth daily.    . metoprolol (LOPRESSOR) 50 MG tablet Take 50 mg by mouth 2 (two) times daily.    . Omega-3 Fatty Acids (FISH OIL PO) Take 306 mg by mouth daily.    . vitamin B-12 (CYANOCOBALAMIN) 1000 MCG tablet Take 1,000 mcg by mouth daily.     No current facility-administered medications for this visit.    OBJECTIVE: BP 146/74 mmHg  Pulse 77  Temp(Src) 96.7 F (35.9 C) (Tympanic)  Resp 18  Wt 225 lb 8.5 oz (102.3 kg)   Body mass index is 32.47 kg/(m^2).    ECOG FS:0 - Asymptomatic  General: Well-developed, well-nourished, no acute distress. Eyes: Pink conjunctiva, anicteric sclera. HEENT: Normocephalic, moist mucous membranes, clear oropharnyx. Lungs: Clear to auscultation bilaterally. Heart: Regular rate and rhythm. No rubs, murmurs, or gallops. Abdomen: Soft, nontender, nondistended. No organomegaly noted, normoactive bowel sounds. Musculoskeletal: No edema, cyanosis, or clubbing. Neuro: Alert, answering all questions appropriately. Cranial nerves grossly intact. Skin: No rashes or petechiae noted. Psych: Normal affect.    LAB RESULTS:  No visits with results within 3 Day(s) from this visit. Latest known visit with results is:  Office Visit on 12/11/2014  Component Date Value Ref Range Status  . Bayer DCA Hb A1c Waived 12/11/2014 7.8* <7.0 % Final   Comment:                                       Diabetic Adult            <7.0                                       Healthy Adult        4.3 - 5.7                                                           (DCCT/NGSP) American Diabetes Association's Summary of Glycemic Recommendations for Adults with Diabetes: Hemoglobin A1c <7.0%. More stringent glycemic goals (A1c <6.0%) may further reduce complications at the cost of increased risk of hypoglycemia.     STUDIES: No  results found.  ASSESSMENT:  Mild Thrombocytopenia.  PLAN:   1. Thombocytopenia. Etiology unclear. Possibly drug related versus low grade ITP. Platelets at this time are stable at 98K. Patient follows closely with PCP due to DM type 2. Advised that we would continue follow up in approximately 6 months. If PCP has any issues with platelet counts he can be seen sooner if needed.  Patient expressed understanding and was in agreement with this plan.  He also understands that He can call clinic at any time with any questions, concerns, or complaints.   Dr. Doylene Canning was available for consultation and review of plan of care for this patient.   Loann Quill, NP   02/07/2015 11:02 AM

## 2015-02-17 DIAGNOSIS — Z23 Encounter for immunization: Secondary | ICD-10-CM | POA: Diagnosis not present

## 2015-02-21 DIAGNOSIS — I10 Essential (primary) hypertension: Secondary | ICD-10-CM | POA: Diagnosis not present

## 2015-02-21 DIAGNOSIS — E1129 Type 2 diabetes mellitus with other diabetic kidney complication: Secondary | ICD-10-CM | POA: Diagnosis not present

## 2015-02-21 DIAGNOSIS — R809 Proteinuria, unspecified: Secondary | ICD-10-CM | POA: Diagnosis not present

## 2015-02-21 DIAGNOSIS — N183 Chronic kidney disease, stage 3 (moderate): Secondary | ICD-10-CM | POA: Diagnosis not present

## 2015-03-09 ENCOUNTER — Other Ambulatory Visit: Payer: Self-pay | Admitting: Unknown Physician Specialty

## 2015-03-14 ENCOUNTER — Ambulatory Visit: Payer: Medicare Other | Admitting: Unknown Physician Specialty

## 2015-03-21 ENCOUNTER — Ambulatory Visit (INDEPENDENT_AMBULATORY_CARE_PROVIDER_SITE_OTHER): Payer: Medicare Other | Admitting: Unknown Physician Specialty

## 2015-03-21 ENCOUNTER — Encounter: Payer: Self-pay | Admitting: Unknown Physician Specialty

## 2015-03-21 VITALS — BP 117/72 | HR 80 | Temp 97.7°F | Ht 69.2 in | Wt 218.4 lb

## 2015-03-21 DIAGNOSIS — E1322 Other specified diabetes mellitus with diabetic chronic kidney disease: Secondary | ICD-10-CM | POA: Diagnosis not present

## 2015-03-21 DIAGNOSIS — E1365 Other specified diabetes mellitus with hyperglycemia: Secondary | ICD-10-CM | POA: Diagnosis not present

## 2015-03-21 DIAGNOSIS — I129 Hypertensive chronic kidney disease with stage 1 through stage 4 chronic kidney disease, or unspecified chronic kidney disease: Secondary | ICD-10-CM

## 2015-03-21 DIAGNOSIS — IMO0002 Reserved for concepts with insufficient information to code with codable children: Secondary | ICD-10-CM

## 2015-03-21 DIAGNOSIS — E785 Hyperlipidemia, unspecified: Secondary | ICD-10-CM

## 2015-03-21 DIAGNOSIS — N183 Chronic kidney disease, stage 3 (moderate): Secondary | ICD-10-CM | POA: Diagnosis not present

## 2015-03-21 LAB — LIPID PANEL PICCOLO, WAIVED
Chol/HDL Ratio Piccolo,Waive: 3.4 mg/dL
Cholesterol Piccolo, Waived: 161 mg/dL (ref <200)
HDL Chol Piccolo, Waived: 47 mg/dL — ABNORMAL LOW (ref >59)
LDL CHOL CALC PICCOLO WAIVED: 95 mg/dL (ref <100)
Triglycerides Piccolo,Waived: 99 mg/dL (ref <150)
VLDL Chol Calc Piccolo,Waive: 20 mg/dL (ref <30)

## 2015-03-21 LAB — MICROALBUMIN, URINE WAIVED
Creatinine, Urine Waived: 200 mg/dL (ref 10–300)
Microalb, Ur Waived: 10 mg/L (ref 0–19)

## 2015-03-21 LAB — BAYER DCA HB A1C WAIVED: HB A1C: 7.3 % — AB (ref <7.0)

## 2015-03-21 MED ORDER — LINAGLIPTIN 5 MG PO TABS: 5.0000 mg | ORAL_TABLET | Freq: Every day | ORAL | Status: DC

## 2015-03-21 MED ORDER — INVOKANA 300 MG PO TABS: 300.0000 mg | ORAL_TABLET | Freq: Every day | ORAL | Status: DC

## 2015-03-21 MED ORDER — MAGNESIUM OXIDE 400 MG PO TABS: 400.0000 mg | ORAL_TABLET | Freq: Every day | ORAL | Status: AC

## 2015-03-21 MED ORDER — METOPROLOL TARTRATE 50 MG PO TABS: 50.0000 mg | ORAL_TABLET | Freq: Two times a day (BID) | ORAL | Status: DC

## 2015-03-21 NOTE — Progress Notes (Signed)
BP 117/72 mmHg  Pulse 80  Temp(Src) 97.7 F (36.5 C)  Ht 5' 9.2" (1.758 m)  Wt 218 lb 6.4 oz (99.066 kg)  BMI 32.05 kg/m2  SpO2 96%   Subjective:    Patient ID: Jonathan [name], male    DOB: [birth year], 69 y.o.   MRN: [id number]  HPI: Jonathan [name] is a 69 y.o. male  Chief Complaint  Patient presents with  . Diabetes   Diabetes This is chronic problem medication compliance is excellent.  He is satisfied with the current medication treatment.  He occasionally checks his blood sugars at home postprandial blood sugar ranges between 140 to 150's.  He states his fasting blood sugar in the mornings are in the 160's.  Hgb A1C 7.8 in July 2016. He has been monitoring his carbohydrate and sugar intake.  Pertinent negatives polyphagia, polydipisia, hypoglycemic events, visual disturbances, dizziness/lightheadedness  Hyperlipidemia/Hypertension This is a chronic problem medication compliance is excellent.  He is satisfied with the current medication treatment.  Pertinent negatives denies chest pain, shortness of breath, edema, headache, visual disturbances, or dizziness/lightheadedness  Relevant past medical, surgical, family and social history reviewed and updated as indicated. Interim medical history since our last visit reviewed. Allergies and medications reviewed and updated  Review of Systems  Constitutional: Negative.   HENT: Negative.   Respiratory: Negative.   Cardiovascular: Negative.   Gastrointestinal: Negative.   Endocrine: Positive for polyuria.  Genitourinary: Negative.   Musculoskeletal: Negative.   Skin: Negative.   Neurological: Negative.     Per HPI unless specifically indicated above     Objective:    BP 117/72 mmHg  Pulse 80  Temp(Src) 97.7 F (36.5 C)  Ht 5' 9.2" (1.758 m)  Wt 218 lb 6.4 oz (99.066 kg)  BMI 32.05 kg/m2  SpO2 96%  Wt Readings from Last 3 Encounters:  03/21/15 218 lb 6.4 oz (99.066 kg)  02/07/15 225 lb 8.5 oz (102.3 kg)  12/11/14  222 lb 6.4 oz (100.88 kg)    Physical Exam  Constitutional: He is oriented to person, place, and time. He appears well-developed and well-nourished. No distress.  HENT:  Head: Normocephalic and atraumatic.  Right Ear: External ear normal.  Left Ear: External ear normal.  Nose: Nose normal.  Neck: Normal range of motion. Neck supple.  Cardiovascular: Normal rate, regular rhythm, normal heart sounds and intact distal pulses.   Pulmonary/Chest: Effort normal and breath sounds normal. No respiratory distress. He has no wheezes. He has no rales.  Musculoskeletal: Normal range of motion. He exhibits no edema or tenderness.  Neurological: He is alert and oriented to person, place, and time.  Skin: Skin is warm and dry. No rash noted. He is not diaphoretic. No erythema. No pallor.  Psychiatric: He has a normal mood and affect. His behavior is normal. Judgment and thought content normal.       Assessment & Plan:   Problem List Items Addressed This Visit      Unprioritized   Hyperlipidemia - Primary    Lipid panel results reviewed LDL 95 Total Cholesterol 161 Stable, continue present medications.        Relevant Medications   metoprolol (LOPRESSOR) 50 MG tablet   Other Relevant Orders   Lipid Panel Piccolo, Waived   Uncontrolled secondary diabetes mellitus with stage 3 CKD (GFR 30-59) (HCC)    HgbA1C reviewed results 7.3 Will continue to monitor sugar and carbohydrate intake and continue to lose weight  Relevant Medications   INVOKANA 300 MG TABS tablet   linagliptin (TRADJENTA) 5 MG TABS tablet   Other Relevant Orders   Bayer DCA Hb A1c Waived   Uric acid   Comprehensive metabolic panel   Hypertensive CKD (chronic kidney disease)    Comprehensive metabolic panel ordered results pending Uric acid ordered results pending Stable, continue present medications.         Relevant Orders   Microalbumin, Urine Waived   Comprehensive metabolic panel       Follow up  plan: Return in about 3 months (around 06/21/2015).

## 2015-03-21 NOTE — Assessment & Plan Note (Addendum)
HgbA1C reviewed results 7.3 Will continue to monitor sugar and carbohydrate intake and continue to lose weight

## 2015-03-21 NOTE — Assessment & Plan Note (Addendum)
Lipid panel results reviewed LDL 95 Total Cholesterol 161 Stable, continue present medications.

## 2015-03-21 NOTE — Assessment & Plan Note (Signed)
Comprehensive metabolic panel ordered results pending Uric acid ordered results pending Stable, continue present medications.

## 2015-03-22 LAB — COMPREHENSIVE METABOLIC PANEL
A/G RATIO: 2.2 (ref 1.1–2.5)
ALT: 11 IU/L (ref 0–44)
AST: 18 IU/L (ref 0–40)
Albumin: 4.6 g/dL (ref 3.6–4.8)
Alkaline Phosphatase: 76 IU/L (ref 39–117)
BUN/Creatinine Ratio: 16 (ref 10–22)
BUN: 24 mg/dL (ref 8–27)
Bilirubin Total: 0.5 mg/dL (ref 0.0–1.2)
CALCIUM: 9.4 mg/dL (ref 8.6–10.2)
CO2: 22 mmol/L (ref 18–29)
CREATININE: 1.53 mg/dL — AB (ref 0.76–1.27)
Chloride: 101 mmol/L (ref 97–106)
GFR, EST AFRICAN AMERICAN: 53 mL/min/{1.73_m2} — AB (ref >59)
GFR, EST NON AFRICAN AMERICAN: 46 mL/min/{1.73_m2} — AB (ref >59)
GLUCOSE: 134 mg/dL — AB (ref 65–99)
Globulin, Total: 2.1 g/dL (ref 1.5–4.5)
Potassium: 4.8 mmol/L (ref 3.5–5.2)
Sodium: 139 mmol/L (ref 136–144)
TOTAL PROTEIN: 6.7 g/dL (ref 6.0–8.5)

## 2015-03-22 LAB — URIC ACID: Uric Acid: 7.2 mg/dL (ref 3.7–8.6)

## 2015-03-24 ENCOUNTER — Other Ambulatory Visit: Payer: Self-pay | Admitting: Unknown Physician Specialty

## 2015-03-24 ENCOUNTER — Encounter: Payer: Self-pay | Admitting: Unknown Physician Specialty

## 2015-03-26 ENCOUNTER — Other Ambulatory Visit: Payer: Self-pay | Admitting: Unknown Physician Specialty

## 2015-04-11 DIAGNOSIS — E119 Type 2 diabetes mellitus without complications: Secondary | ICD-10-CM | POA: Diagnosis not present

## 2015-04-11 LAB — HM DIABETES EYE EXAM

## 2015-06-18 ENCOUNTER — Other Ambulatory Visit: Payer: Self-pay | Admitting: Unknown Physician Specialty

## 2015-06-20 ENCOUNTER — Encounter: Payer: Self-pay | Admitting: Unknown Physician Specialty

## 2015-06-20 ENCOUNTER — Ambulatory Visit (INDEPENDENT_AMBULATORY_CARE_PROVIDER_SITE_OTHER): Payer: Medicare Other | Admitting: Unknown Physician Specialty

## 2015-06-20 VITALS — BP 104/68 | HR 74 | Temp 97.4°F | Ht 69.7 in | Wt 214.2 lb

## 2015-06-20 DIAGNOSIS — I129 Hypertensive chronic kidney disease with stage 1 through stage 4 chronic kidney disease, or unspecified chronic kidney disease: Secondary | ICD-10-CM | POA: Diagnosis not present

## 2015-06-20 DIAGNOSIS — E1122 Type 2 diabetes mellitus with diabetic chronic kidney disease: Secondary | ICD-10-CM

## 2015-06-20 DIAGNOSIS — E785 Hyperlipidemia, unspecified: Secondary | ICD-10-CM | POA: Diagnosis not present

## 2015-06-20 LAB — BAYER DCA HB A1C WAIVED: HB A1C (BAYER DCA - WAIVED): 7.7 % — ABNORMAL HIGH (ref <7.0)

## 2015-06-20 LAB — LIPID PANEL PICCOLO, WAIVED
CHOL/HDL RATIO PICCOLO,WAIVE: 2.8 mg/dL
CHOLESTEROL PICCOLO, WAIVED: 170 mg/dL (ref <200)
HDL CHOL PICCOLO, WAIVED: 60 mg/dL (ref >59)
LDL CHOL CALC PICCOLO WAIVED: 91 mg/dL (ref <100)
Triglycerides Piccolo,Waived: 94 mg/dL (ref <150)
VLDL Chol Calc Piccolo,Waive: 19 mg/dL (ref <30)

## 2015-06-20 MED ORDER — METFORMIN HCL 500 MG PO TABS: 500.0000 mg | ORAL_TABLET | Freq: Two times a day (BID) | ORAL | Status: DC

## 2015-06-20 NOTE — Assessment & Plan Note (Signed)
Metformin worked well in the past.  Taken off due to eGFR.  FDA is now suggesting it is safe.  Will restart.

## 2015-06-20 NOTE — Progress Notes (Signed)
BP 104/68 mmHg  Pulse 74  Temp(Src) 97.4 F (36.3 C)  Ht 5' 9.7" (1.77 m)  Wt 214 lb 3.2 oz (97.16 kg)  BMI 31.01 kg/m2  SpO2 98%   Subjective:    Patient ID: Jonathan [name], male    DOB: [birth year], 70 y.o.   MRN: [id number]  HPI: Jonathan [name] is a 70 y.o. male  Chief Complaint  Patient presents with  . Diabetes  . Hyperlipidemia  . Hypertension   Diabetes:  Using medications without difficulties: Hypoglycemic episodes: none Hyperglycemic episodes:none Feet problems:none Blood Sugars averaging: still high in the AM Eye exam within last year: Last A1c: 7.3  Hypertension:    Using medication without problems or lightheadedness  No nhest pain with exertion: No edema No shortness of breath Average home BPs: Low 100's over 60's   Elevated Cholesterol: Using medications without problems No muscle aches  Diet compliance:lots of fruit and vegetables.   Exercise: on the job Supplements? none    Relevant past medical, surgical, family and social history reviewed and updated as indicated. Interim medical history since our last visit reviewed. Allergies and medications reviewed and updated.  Review of Systems  Per HPI unless specifically indicated above     Objective:    BP 104/68 mmHg  Pulse 74  Temp(Src) 97.4 F (36.3 C)  Ht 5' 9.7" (1.77 m)  Wt 214 lb 3.2 oz (97.16 kg)  BMI 31.01 kg/m2  SpO2 98%  Wt Readings from Last 3 Encounters:  06/20/15 214 lb 3.2 oz (97.16 kg)  03/21/15 218 lb 6.4 oz (99.066 kg)  02/07/15 225 lb 8.5 oz (102.3 kg)    Physical Exam  Constitutional: He is oriented to person, place, and time. He appears well-developed and well-nourished. No distress.  HENT:  Head: Normocephalic and atraumatic.  Eyes: Conjunctivae and lids are normal. Right eye exhibits no discharge. Left eye exhibits no discharge. No scleral icterus.  Neck: Normal range of motion. Neck supple. No JVD present. Carotid bruit is not present.  Cardiovascular:  Normal rate, regular rhythm and normal heart sounds.   Pulmonary/Chest: Effort normal and breath sounds normal. No respiratory distress.  Abdominal: Normal appearance. There is no splenomegaly or hepatomegaly.  Musculoskeletal: Normal range of motion.  Neurological: He is alert and oriented to person, place, and time.  Skin: Skin is warm, dry and intact. No rash noted. No pallor.  Psychiatric: He has a normal mood and affect. His behavior is normal. Judgment and thought content normal.    Results for orders placed or performed in visit on 03/21/15  Bayer DCA Hb A1c Waived  Result Value Ref Range   Bayer DCA Hb A1c Waived 7.3 (H) <7.0 %  Lipid Panel Piccolo, Waived  Result Value Ref Range   Cholesterol Piccolo, Waived 161 <200 mg/dL   HDL Chol Piccolo, Waived 47 (L) >59 mg/dL   Triglycerides Piccolo,Waived 99 <150 mg/dL   Chol/HDL Ratio Piccolo,Waive 3.4 mg/dL   LDL Chol Calc Piccolo Waived 95 <100 mg/dL   VLDL Chol Calc Piccolo,Waive 20 <30 mg/dL  Uric acid  Result Value Ref Range   Uric Acid 7.2 3.7 - 8.6 mg/dL  Microalbumin, Urine Waived  Result Value Ref Range   Microalb, Ur Waived 10 0 - 19 mg/L   Creatinine, Urine Waived 200 10 - 300 mg/dL   Microalb/Creat Ratio <30 <30 mg/g  Comprehensive metabolic panel  Result Value Ref Range   Glucose 134 (H) 65 - 99 mg/dL   BUN  24 8 - 27 mg/dL   Creatinine, Ser 1.53 (H) 0.76 - 1.27 mg/dL   GFR calc non Af Amer 46 (L) >59 mL/min/1.73   GFR calc Af Amer 53 (L) >59 mL/min/1.73   BUN/Creatinine Ratio 16 10 - 22   Sodium 139 136 - 144 mmol/L   Potassium 4.8 3.5 - 5.2 mmol/L   Chloride 101 97 - 106 mmol/L   CO2 22 18 - 29 mmol/L   Calcium 9.4 8.6 - 10.2 mg/dL   Total Protein 6.7 6.0 - 8.5 g/dL   Albumin 4.6 3.6 - 4.8 g/dL   Globulin, Total 2.1 1.5 - 4.5 g/dL   Albumin/Globulin Ratio 2.2 1.1 - 2.5   Bilirubin Total 0.5 0.0 - 1.2 mg/dL   Alkaline Phosphatase 76 39 - 117 IU/L   AST 18 0 - 40 IU/L   ALT 11 0 - 44 IU/L       Assessment & Plan:   Problem List Items Addressed This Visit      Unprioritized   Hyperlipidemia    LDL is 91.  Will continue present medications      Relevant Orders   Lipid Panel Piccolo, Waived   Hypertensive CKD (chronic kidney disease)   Relevant Orders   Comprehensive metabolic panel   Type 2 diabetes mellitus with chronic kidney disease, without long-term current use of insulin (HCC) - Primary    Metformin worked well in the past.  Taken off due to eGFR.  FDA is now suggesting it is safe.  Will restart.        Relevant Medications   metFORMIN (GLUCOPHAGE) 500 MG tablet   Other Relevant Orders   Bayer DCA Hb A1c Waived       Follow up plan: Return in about 4 weeks (around 07/18/2015) for CMP and check up. and again in 3 months

## 2015-06-20 NOTE — Assessment & Plan Note (Signed)
LDL is 91.  Will continue present medications

## 2015-06-21 LAB — COMPREHENSIVE METABOLIC PANEL
A/G RATIO: 1.8 (ref 1.1–2.5)
ALBUMIN: 4.5 g/dL (ref 3.6–4.8)
ALK PHOS: 78 IU/L (ref 39–117)
ALT: 18 IU/L (ref 0–44)
AST: 17 IU/L (ref 0–40)
BUN / CREAT RATIO: 19 (ref 10–22)
BUN: 26 mg/dL (ref 8–27)
Bilirubin Total: 0.8 mg/dL (ref 0.0–1.2)
CALCIUM: 9.5 mg/dL (ref 8.6–10.2)
CO2: 23 mmol/L (ref 18–29)
CREATININE: 1.38 mg/dL — AB (ref 0.76–1.27)
Chloride: 95 mmol/L — ABNORMAL LOW (ref 96–106)
GFR calc Af Amer: 60 mL/min/{1.73_m2} (ref >59)
GFR, EST NON AFRICAN AMERICAN: 52 mL/min/{1.73_m2} — AB (ref >59)
GLOBULIN, TOTAL: 2.5 g/dL (ref 1.5–4.5)
Glucose: 112 mg/dL — ABNORMAL HIGH (ref 65–99)
Potassium: 4.5 mmol/L (ref 3.5–5.2)
SODIUM: 137 mmol/L (ref 134–144)
Total Protein: 7 g/dL (ref 6.0–8.5)

## 2015-07-07 ENCOUNTER — Other Ambulatory Visit: Payer: Self-pay | Admitting: Unknown Physician Specialty

## 2015-07-18 ENCOUNTER — Ambulatory Visit (INDEPENDENT_AMBULATORY_CARE_PROVIDER_SITE_OTHER): Payer: Medicare Other | Admitting: Unknown Physician Specialty

## 2015-07-18 ENCOUNTER — Encounter: Payer: Self-pay | Admitting: Unknown Physician Specialty

## 2015-07-18 VITALS — BP 105/67 | HR 72 | Temp 98.0°F | Ht 70.0 in | Wt 214.2 lb

## 2015-07-18 DIAGNOSIS — E1122 Type 2 diabetes mellitus with diabetic chronic kidney disease: Secondary | ICD-10-CM | POA: Diagnosis not present

## 2015-07-18 DIAGNOSIS — N183 Chronic kidney disease, stage 3 (moderate): Secondary | ICD-10-CM | POA: Diagnosis not present

## 2015-07-18 NOTE — Addendum Note (Signed)
Addended by: Gabriel Cirri on: 07/18/2015 11:18 AM   Modules accepted: Orders

## 2015-07-18 NOTE — Progress Notes (Signed)
   BP 105/67 mmHg  Pulse 72  Temp(Src) 98 F (36.7 C)  Ht  (1.778 m)  Wt 214 lb 3.2 oz (97.16 kg)  BMI 30.73 kg/m2  SpO2 97%   Subjective:    Patient ID: [name], male    DOB: 22-Oct-1945, 70 y.o.   MRN: [id number]  HPI: BENTON TOOKER is a 70 y.o. male  Chief Complaint  Patient presents with  . Diabetes   Diabetes:  Using medications without difficulties No hypoglycemic episodes No hyperglycemic episodes Feet problems: none Blood Sugars averaging: "a little high in the morning"   eye exam within last year Last Hgb A1C 7.7  Relevant past medical, surgical, family and social history reviewed and updated as indicated. Interim medical history since our last visit reviewed. Allergies and medications reviewed and updated.  Review of Systems  Per HPI unless specifically indicated above     Objective:    BP 105/67 mmHg  Pulse 72  Temp(Src) 98 F (36.7 C)  Ht  (1.778 m)  Wt 214 lb 3.2 oz (97.16 kg)  BMI 30.73 kg/m2  SpO2 97%  Wt Readings from Last 3 Encounters:  07/18/15 214 lb 3.2 oz (97.16 kg)  06/20/15 214 lb 3.2 oz (97.16 kg)  03/21/15 218 lb 6.4 oz (99.066 kg)    Physical Exam  Constitutional: He is oriented to person, place, and time. He appears well-developed and well-nourished. No distress.  HENT:  Head: Normocephalic and atraumatic.  Eyes: Conjunctivae and lids are normal. Right eye exhibits no discharge. Left eye exhibits no discharge. No scleral icterus.  Neck: Normal range of motion. Neck supple. No JVD present. Carotid bruit is not present.  Cardiovascular: Normal rate, regular rhythm and normal heart sounds.   Pulmonary/Chest: Effort normal and breath sounds normal. No respiratory distress.  Abdominal: Normal appearance. There is no splenomegaly or hepatomegaly.  Musculoskeletal: Normal range of motion.  Neurological: He is alert and oriented to person, place, and time.  Skin: Skin is warm, dry and intact. No rash noted. No pallor.   Psychiatric: He has a normal mood and affect. His behavior is normal. Judgment and thought content normal.      Assessment & Plan:   Problem List Items Addressed This Visit      Unprioritized   Type 2 diabetes mellitus with chronic kidney disease, without long-term current use of insulin (HCC) - Primary    Check CMP since starting on Metformin last month          Follow up plan: Return in about 2 months (around 09/15/2015).

## 2015-07-18 NOTE — Assessment & Plan Note (Signed)
Check CMP since starting on Metformin last month

## 2015-07-19 LAB — COMPREHENSIVE METABOLIC PANEL
A/G RATIO: 1.8 (ref 1.1–2.5)
ALT: 9 IU/L (ref 0–44)
AST: 15 IU/L (ref 0–40)
Albumin: 4.6 g/dL (ref 3.5–4.8)
Alkaline Phosphatase: 73 IU/L (ref 39–117)
BUN/Creatinine Ratio: 17 (ref 10–22)
BUN: 23 mg/dL (ref 8–27)
Bilirubin Total: 0.7 mg/dL (ref 0.0–1.2)
CALCIUM: 9.5 mg/dL (ref 8.6–10.2)
CO2: 18 mmol/L (ref 18–29)
CREATININE: 1.35 mg/dL — AB (ref 0.76–1.27)
Chloride: 97 mmol/L (ref 96–106)
GFR, EST AFRICAN AMERICAN: 61 mL/min/{1.73_m2} (ref >59)
GFR, EST NON AFRICAN AMERICAN: 53 mL/min/{1.73_m2} — AB (ref >59)
GLOBULIN, TOTAL: 2.5 g/dL (ref 1.5–4.5)
Glucose: 114 mg/dL — ABNORMAL HIGH (ref 65–99)
POTASSIUM: 5.3 mmol/L — AB (ref 3.5–5.2)
Sodium: 137 mmol/L (ref 134–144)
TOTAL PROTEIN: 7.1 g/dL (ref 6.0–8.5)

## 2015-08-01 DIAGNOSIS — I480 Paroxysmal atrial fibrillation: Secondary | ICD-10-CM | POA: Diagnosis not present

## 2015-08-01 DIAGNOSIS — I10 Essential (primary) hypertension: Secondary | ICD-10-CM | POA: Diagnosis not present

## 2015-08-01 DIAGNOSIS — I484 Atypical atrial flutter: Secondary | ICD-10-CM | POA: Diagnosis not present

## 2015-08-01 DIAGNOSIS — E782 Mixed hyperlipidemia: Secondary | ICD-10-CM | POA: Diagnosis not present

## 2015-08-07 ENCOUNTER — Encounter: Payer: Self-pay | Admitting: *Deleted

## 2015-08-08 ENCOUNTER — Inpatient Hospital Stay: Payer: Medicare Other | Attending: Internal Medicine

## 2015-08-08 ENCOUNTER — Inpatient Hospital Stay (HOSPITAL_BASED_OUTPATIENT_CLINIC_OR_DEPARTMENT_OTHER): Payer: Medicare Other | Admitting: Internal Medicine

## 2015-08-08 VITALS — BP 144/84 | HR 82 | Temp 96.2°F | Resp 16 | Wt 218.9 lb

## 2015-08-08 DIAGNOSIS — Z7984 Long term (current) use of oral hypoglycemic drugs: Secondary | ICD-10-CM | POA: Diagnosis not present

## 2015-08-08 DIAGNOSIS — Z7982 Long term (current) use of aspirin: Secondary | ICD-10-CM

## 2015-08-08 DIAGNOSIS — E119 Type 2 diabetes mellitus without complications: Secondary | ICD-10-CM | POA: Insufficient documentation

## 2015-08-08 DIAGNOSIS — D696 Thrombocytopenia, unspecified: Secondary | ICD-10-CM | POA: Insufficient documentation

## 2015-08-08 DIAGNOSIS — Z808 Family history of malignant neoplasm of other organs or systems: Secondary | ICD-10-CM

## 2015-08-08 DIAGNOSIS — Z79899 Other long term (current) drug therapy: Secondary | ICD-10-CM | POA: Diagnosis not present

## 2015-08-08 DIAGNOSIS — I10 Essential (primary) hypertension: Secondary | ICD-10-CM

## 2015-08-08 DIAGNOSIS — Z801 Family history of malignant neoplasm of trachea, bronchus and lung: Secondary | ICD-10-CM | POA: Insufficient documentation

## 2015-08-08 DIAGNOSIS — J45909 Unspecified asthma, uncomplicated: Secondary | ICD-10-CM | POA: Insufficient documentation

## 2015-08-08 DIAGNOSIS — E785 Hyperlipidemia, unspecified: Secondary | ICD-10-CM | POA: Diagnosis not present

## 2015-08-08 DIAGNOSIS — Z87891 Personal history of nicotine dependence: Secondary | ICD-10-CM | POA: Diagnosis not present

## 2015-08-08 LAB — CBC WITH DIFFERENTIAL/PLATELET
BASOS ABS: 0.1 10*3/uL (ref 0–0.1)
Basophils Relative: 1 %
Eosinophils Absolute: 0.2 10*3/uL (ref 0–0.7)
Eosinophils Relative: 4 %
HEMATOCRIT: 44.8 % (ref 40.0–52.0)
HEMOGLOBIN: 15.1 g/dL (ref 13.0–18.0)
Lymphocytes Relative: 26 %
Lymphs Abs: 1.1 10*3/uL (ref 1.0–3.6)
MCH: 27.4 pg (ref 26.0–34.0)
MCHC: 33.8 g/dL (ref 32.0–36.0)
MCV: 81.2 fL (ref 80.0–100.0)
MONOS PCT: 11 %
Monocytes Absolute: 0.5 10*3/uL (ref 0.2–1.0)
NEUTROS ABS: 2.5 10*3/uL (ref 1.4–6.5)
NEUTROS PCT: 58 %
Platelets: 97 10*3/uL — ABNORMAL LOW (ref 150–440)
RBC: 5.52 MIL/uL (ref 4.40–5.90)
RDW: 16.1 % — ABNORMAL HIGH (ref 11.5–14.5)
WBC: 4.4 10*3/uL (ref 3.8–10.6)

## 2015-08-08 NOTE — Progress Notes (Signed)
[facility] OFFICE PROGRESS NOTE  Patient Care Team: Kathrine Haddock, NP as PCP - General (Nurse Practitioner)   SUMMARY OF ONCOLOGIC HISTORY:  # CHRONIC THROMBOCYTOPENIA [90s-130s]; ? ITP; surveillance US- spleen- neg.   # CKD creat- 1.3-1.5 [Dr.Kolluru]  INTERVAL HISTORY:  This is my first interaction with the patient since I joined the practice September 2016. I reviewed the patient's prior charts/pertinent labs/imaging in detail; findings are summarized above.   A pleasant 70 year old male patient with above history of chronic, cytopenia is here for follow-up. Patient denies any bleeding. Denies any easy bruising. Denies any weight loss. Denies any night sweats. No lumps or bumps.    REVIEW OF SYSTEMS:  A complete 10 point review of system is done which is negative except mentioned above/history of present illness.   PAST MEDICAL HISTORY :  Past Medical History  Diagnosis Date  . Hypertension   . Asthma   . Hyperlipidemia   . Diabetes mellitus without complication ([place])   . Thrombocytopenia ([place])   . Lymphopenia     PAST SURGICAL HISTORY :   Past Surgical History  Procedure Laterality Date  . Heart ablation    . Knee surgery Bilateral     FAMILY HISTORY :   Family History  Problem Relation Age of Onset  . Hypertension Mother   . Lung cancer Father     lung  . Lung disease Father   . Hypertension Father   . Breast cancer Sister 67    SOCIAL HISTORY:   Social History  Substance Use Topics  . Smoking status: Former Smoker -- 1.00 packs/day for 35 years    Types: Cigarettes    Quit date: 03/06/1978  . Smokeless tobacco: Former Systems developer    Quit date: 03/21/2007  . Alcohol Use: 0.0 - 1.8 oz/week    0 Standard drinks or equivalent, 0-3 Cans of beer per week    ALLERGIES:  has No Known Allergies.  MEDICATIONS:  Current Outpatient Prescriptions  Medication Sig Dispense Refill  . aspirin 81 MG tablet Take 81 mg by mouth daily.    Marland Kitchen atorvastatin  (LIPITOR) 40 MG tablet TAKE 1 TABLET AT BED-TIME ORAL 90 tablet 1  . glucose blood test strip 1 each by Other route as needed for other. Use as instructed    . INVOKANA 300 MG TABS tablet Take 300 mg by mouth daily. 30 tablet 6  . levothyroxine (SYNTHROID, LEVOTHROID) 137 MCG tablet TAKE ONE TABLET BY MOUTH ONCE DAILY 90 tablet 0  . linagliptin (TRADJENTA) 5 MG TABS tablet Take 1 tablet (5 mg total) by mouth daily. 30 tablet 6  . lisinopril (PRINIVIL,ZESTRIL) 20 MG tablet TAKE ONE TABLET BY MOUTH ONCE DAILY 90 tablet 0  . magnesium oxide (MAG-OX) 400 MG tablet Take 1 tablet (400 mg total) by mouth daily. 90 tablet 1  . metFORMIN (GLUCOPHAGE) 500 MG tablet Take 1 tablet (500 mg total) by mouth 2 (two) times daily with a meal. 60 tablet 3  . metoprolol (LOPRESSOR) 50 MG tablet TAKE 1 TABLET (50 MG TOTAL) BY MOUTH 2 (TWO) TIMES DAILY. 90 tablet 1  . Omega-3 Fatty Acids (FISH OIL PO) Take 306 mg by mouth daily.    . vitamin B-12 (CYANOCOBALAMIN) 1000 MCG tablet Take 1,000 mcg by mouth daily.     No current facility-administered medications for this visit.    PHYSICAL EXAMINATION:   BP 144/84 mmHg  Pulse 82  Temp(Src) 96.2 F (35.7 C) (Tympanic)  Resp 16  Wt 218 lb 14.7 oz (99.3 kg)  Filed Weights   08/08/15 1011  Weight: 218 lb 14.7 oz (99.3 kg)    GENERAL: Well-nourished well-developed; Alert, no distress and comfortable. Accompanied by family.  EYES: no pallor or icterus OROPHARYNX: no thrush or ulceration; good dentition  NECK: supple, no masses felt LYMPH:  no palpable lymphadenopathy in the cervical, axillary or inguinal regions LUNGS: clear to auscultation and  No wheeze or crackles HEART/CVS: regular rate & rhythm and no murmurs; No lower extremity edema ABDOMEN:abdomen soft, non-tender and normal bowel sounds Musculoskeletal:no cyanosis of digits and no clubbing  PSYCH: alert & oriented x 3 with fluent speech NEURO: no focal motor/sensory deficits SKIN:  no rashes or  significant lesions  LABORATORY DATA:  I have reviewed the data as listed    Component Value Date/Time   NA 137 07/18/2015 1119   NA 140 11/15/2011 0709   K 5.3* 07/18/2015 1119   K 5.0 11/15/2011 0709   CL 97 07/18/2015 1119   CL 107 11/15/2011 0709   CO2 18 07/18/2015 1119   CO2 19* 11/15/2011 0709   GLUCOSE 114* 07/18/2015 1119   GLUCOSE 173* 11/15/2011 0709   BUN 23 07/18/2015 1119   BUN 28* 11/15/2011 0709   CREATININE 1.35* 07/18/2015 1119   CREATININE 1.41* 11/15/2011 0709   CALCIUM 9.5 07/18/2015 1119   CALCIUM 8.7 11/15/2011 0709   PROT 7.1 07/18/2015 1119   PROT 7.1 06/06/2011 0528   ALBUMIN 4.6 07/18/2015 1119   ALBUMIN 3.9 06/06/2011 0528   AST 15 07/18/2015 1119   AST 24 06/06/2011 0528   ALT 9 07/18/2015 1119   ALT 25 06/06/2011 0528   ALKPHOS 73 07/18/2015 1119   ALKPHOS 74 06/06/2011 0528   BILITOT 0.7 07/18/2015 1119   BILITOT 0.6 06/06/2011 0528   GFRNONAA 53* 07/18/2015 1119   GFRNONAA 52* 11/15/2011 0709   GFRNONAA >60 06/07/2011 0550   GFRAA 61 07/18/2015 1119   GFRAA 60* 11/15/2011 0709   GFRAA >60 06/07/2011 0550    No results found for: SPEP, UPEP  Lab Results  Component Value Date   WBC 4.4 08/08/2015   NEUTROABS 2.5 08/08/2015   HGB 15.1 08/08/2015   HCT 44.8 08/08/2015   MCV 81.2 08/08/2015   PLT 97* 08/08/2015      Chemistry      Component Value Date/Time   NA 137 07/18/2015 1119   NA 140 11/15/2011 0709   K 5.3* 07/18/2015 1119   K 5.0 11/15/2011 0709   CL 97 07/18/2015 1119   CL 107 11/15/2011 0709   CO2 18 07/18/2015 1119   CO2 19* 11/15/2011 0709   BUN 23 07/18/2015 1119   BUN 28* 11/15/2011 0709   CREATININE 1.35* 07/18/2015 1119   CREATININE 1.41* 11/15/2011 0709      Component Value Date/Time   CALCIUM 9.5 07/18/2015 1119   CALCIUM 8.7 11/15/2011 0709   ALKPHOS 73 07/18/2015 1119   ALKPHOS 74 06/06/2011 0528   AST 15 07/18/2015 1119   AST 24 06/06/2011 0528   ALT 9 07/18/2015 1119   ALT 25 06/06/2011 0528    BILITOT 0.7 07/18/2015 1119   BILITOT 0.6 06/06/2011 0528         ASSESSMENT & PLAN:   # Chronic thrombocytopenia- since 2013 platelets in range of 120- 90s. Normal hemoglobin and white count. Likely a benign process like ITP. However this is asymptomatic. I recommend surveillance for now.  # Discussed with the patient that if  his platelets start dropping/ his symptomatic I would recommend further workup including a possible bone marrow biopsy to rule out myelodysplastic syndrome/less likely.  # Patient prefers to follow up with PCP; states he has labs every 3 months. However he is agreeable to follow up in one year with us/with labs. He will call us if his symptomatic in between.  # 15 minutes face-to-face with the patient discussing the above plan of care; more than 50% of time spent on natural history; counseling and coordination.     Cammie Sickle, MD 08/08/2015 10:51 AM

## 2015-08-08 NOTE — Progress Notes (Signed)
Patient does not offer any problems today.  

## 2015-08-24 ENCOUNTER — Other Ambulatory Visit: Payer: Self-pay | Admitting: Unknown Physician Specialty

## 2015-08-24 NOTE — Telephone Encounter (Signed)
rx

## 2015-08-29 DIAGNOSIS — E875 Hyperkalemia: Secondary | ICD-10-CM | POA: Diagnosis not present

## 2015-08-29 DIAGNOSIS — N183 Chronic kidney disease, stage 3 (moderate): Secondary | ICD-10-CM | POA: Diagnosis not present

## 2015-08-29 DIAGNOSIS — I129 Hypertensive chronic kidney disease with stage 1 through stage 4 chronic kidney disease, or unspecified chronic kidney disease: Secondary | ICD-10-CM | POA: Diagnosis not present

## 2015-08-29 DIAGNOSIS — E1122 Type 2 diabetes mellitus with diabetic chronic kidney disease: Secondary | ICD-10-CM | POA: Diagnosis not present

## 2015-08-29 DIAGNOSIS — E872 Acidosis: Secondary | ICD-10-CM | POA: Diagnosis not present

## 2015-09-08 ENCOUNTER — Other Ambulatory Visit: Payer: Self-pay | Admitting: Internal Medicine

## 2015-09-10 ENCOUNTER — Other Ambulatory Visit: Payer: Self-pay | Admitting: Unknown Physician Specialty

## 2015-09-19 ENCOUNTER — Ambulatory Visit: Payer: Medicare Other | Admitting: Unknown Physician Specialty

## 2015-09-26 ENCOUNTER — Encounter: Payer: Self-pay | Admitting: Unknown Physician Specialty

## 2015-09-26 ENCOUNTER — Ambulatory Visit (INDEPENDENT_AMBULATORY_CARE_PROVIDER_SITE_OTHER): Payer: Medicare Other | Admitting: Unknown Physician Specialty

## 2015-09-26 VITALS — BP 101/66 | HR 75 | Temp 97.9°F | Ht 69.8 in | Wt 209.2 lb

## 2015-09-26 DIAGNOSIS — N183 Chronic kidney disease, stage 3 unspecified: Secondary | ICD-10-CM

## 2015-09-26 DIAGNOSIS — E785 Hyperlipidemia, unspecified: Secondary | ICD-10-CM | POA: Diagnosis not present

## 2015-09-26 DIAGNOSIS — I129 Hypertensive chronic kidney disease with stage 1 through stage 4 chronic kidney disease, or unspecified chronic kidney disease: Secondary | ICD-10-CM

## 2015-09-26 DIAGNOSIS — E1122 Type 2 diabetes mellitus with diabetic chronic kidney disease: Secondary | ICD-10-CM | POA: Diagnosis not present

## 2015-09-26 LAB — BAYER DCA HB A1C WAIVED: HB A1C (BAYER DCA - WAIVED): 6.9 % (ref <7.0)

## 2015-09-26 MED ORDER — LEVOTHYROXINE SODIUM 137 MCG PO TABS: 137.0000 ug | ORAL_TABLET | Freq: Every day | ORAL | Status: DC

## 2015-09-26 MED ORDER — LINAGLIPTIN 5 MG PO TABS: 5.0000 mg | ORAL_TABLET | Freq: Every day | ORAL | Status: DC

## 2015-09-26 MED ORDER — INVOKANA 300 MG PO TABS: 300.0000 mg | ORAL_TABLET | Freq: Every day | ORAL | Status: DC

## 2015-09-26 MED ORDER — LISINOPRIL 20 MG PO TABS: 20.0000 mg | ORAL_TABLET | Freq: Every day | ORAL | Status: DC

## 2015-09-26 MED ORDER — METOPROLOL TARTRATE 50 MG PO TABS: 50.0000 mg | ORAL_TABLET | Freq: Two times a day (BID) | ORAL | Status: DC

## 2015-09-26 MED ORDER — METFORMIN HCL 500 MG PO TABS: 500.0000 mg | ORAL_TABLET | Freq: Two times a day (BID) | ORAL | Status: DC

## 2015-09-26 MED ORDER — ATORVASTATIN CALCIUM 80 MG PO TABS: 80.0000 mg | ORAL_TABLET | Freq: Every day | ORAL | Status: DC

## 2015-09-26 NOTE — Assessment & Plan Note (Signed)
Hgb A1C is 6.9.  Continue present medication 

## 2015-09-26 NOTE — Assessment & Plan Note (Signed)
Stable, continue present medications.   

## 2015-09-26 NOTE — Progress Notes (Signed)
BP 101/66 mmHg  Pulse 75  Temp(Src) 97.9 F (36.6 C)  Ht 5' 9.8" (1.773 m)  Wt 209 lb 3.2 oz (94.892 kg)  BMI 30.19 kg/m2  SpO2 98%   Subjective:    Patient ID: Jonathan [name], male    DOB: 1945/06/18, 70 y.o.   MRN: [id number]  HPI: Jonathan [name] is a 70 y.o. male  Chief Complaint  Patient presents with  . Diabetes  . Hyperlipidemia  . Hypertension   Diabetes:  Using medications without difficulties No hypoglycemic episodes No hyperglycemic episodes Feet problems: none Blood Sugars averaging: "a little high in the morning"   eye exam within last year Last Hgb A1C 7.7  Hypertension Using medications without difficulty Average home BPs 110-120/60-75   No problems or lightheadedness No chest pain with exertion or shortness of breath No Edema   Hyperlipidemia Using medications without problems.  Cardiologist increased him to 80 mg but he doesn't know why.   No Muscle aches  Diet compliance: Good.  Losing weight  Exercise: Working   Relevant past medical, surgical, family and social history reviewed and updated as indicated. Interim medical history since our last visit reviewed. Allergies and medications reviewed and updated.  Review of Systems  Per HPI unless specifically indicated above     Objective:    BP 101/66 mmHg  Pulse 75  Temp(Src) 97.9 F (36.6 C)  Ht 5' 9.8" (1.773 m)  Wt 209 lb 3.2 oz (94.892 kg)  BMI 30.19 kg/m2  SpO2 98%  Wt Readings from Last 3 Encounters:  09/26/15 209 lb 3.2 oz (94.892 kg)  08/08/15 218 lb 14.7 oz (99.3 kg)  07/18/15 214 lb 3.2 oz (97.16 kg)    Physical Exam  Constitutional: He is oriented to person, place, and time. He appears well-developed and well-nourished. No distress.  HENT:  Head: Normocephalic and atraumatic.  Eyes: Conjunctivae and lids are normal. Right eye exhibits no discharge. Left eye exhibits no discharge. No scleral icterus.  Neck: Normal range of motion. Neck supple. No JVD present. Carotid  bruit is not present.  Cardiovascular: Normal rate, regular rhythm and normal heart sounds.   Pulmonary/Chest: Effort normal and breath sounds normal. No respiratory distress.  Abdominal: Normal appearance. There is no splenomegaly or hepatomegaly.  Musculoskeletal: Normal range of motion.  Neurological: He is alert and oriented to person, place, and time.  Skin: Skin is warm, dry and intact. No rash noted. No pallor.  Psychiatric: He has a normal mood and affect. His behavior is normal. Judgment and thought content normal.      Assessment & Plan:   Problem List Items Addressed This Visit      Unprioritized   CKD (chronic kidney disease), stage III - Primary   Relevant Orders   Comprehensive metabolic panel   Hyperlipidemia   Relevant Medications   lisinopril (PRINIVIL,ZESTRIL) 20 MG tablet   metoprolol (LOPRESSOR) 50 MG tablet   atorvastatin (LIPITOR) 80 MG tablet   Other Relevant Orders   Lipid Panel w/o Chol/HDL Ratio   Hypertensive CKD (chronic kidney disease)    Stable, continue present medications.        Relevant Orders   Comprehensive metabolic panel   Type 2 diabetes mellitus with chronic kidney disease, without long-term current use of insulin (HCC)    Hgb A1C is 6.9%.  Continue present medication.        Relevant Medications   INVOKANA 300 MG TABS tablet   linagliptin (TRADJENTA) 5 MG TABS  tablet   lisinopril (PRINIVIL,ZESTRIL) 20 MG tablet   metFORMIN (GLUCOPHAGE) 500 MG tablet   atorvastatin (LIPITOR) 80 MG tablet   Other Relevant Orders   Bayer DCA Hb A1c Waived       Follow up plan: Return in about 6 months (around 03/28/2016) for physical.

## 2015-09-27 ENCOUNTER — Encounter: Payer: Self-pay | Admitting: Unknown Physician Specialty

## 2015-09-27 LAB — LIPID PANEL W/O CHOL/HDL RATIO
CHOLESTEROL TOTAL: 147 mg/dL (ref 100–199)
HDL: 48 mg/dL (ref >39)
LDL CALC: 74 mg/dL (ref 0–99)
Triglycerides: 127 mg/dL (ref 0–149)
VLDL Cholesterol Cal: 25 mg/dL (ref 5–40)

## 2015-09-27 LAB — COMPREHENSIVE METABOLIC PANEL
A/G RATIO: 2 (ref 1.2–2.2)
ALBUMIN: 4.7 g/dL (ref 3.5–4.8)
ALT: 11 IU/L (ref 0–44)
AST: 19 IU/L (ref 0–40)
Alkaline Phosphatase: 68 IU/L (ref 39–117)
BUN/Creatinine Ratio: 23 (ref 10–24)
BUN: 31 mg/dL — ABNORMAL HIGH (ref 8–27)
Bilirubin Total: 0.5 mg/dL (ref 0.0–1.2)
CALCIUM: 9.7 mg/dL (ref 8.6–10.2)
CO2: 21 mmol/L (ref 18–29)
Chloride: 99 mmol/L (ref 96–106)
Creatinine, Ser: 1.32 mg/dL — ABNORMAL HIGH (ref 0.76–1.27)
GFR, EST AFRICAN AMERICAN: 63 mL/min/{1.73_m2} (ref >59)
GFR, EST NON AFRICAN AMERICAN: 54 mL/min/{1.73_m2} — AB (ref >59)
GLOBULIN, TOTAL: 2.4 g/dL (ref 1.5–4.5)
Glucose: 113 mg/dL — ABNORMAL HIGH (ref 65–99)
POTASSIUM: 5.6 mmol/L — AB (ref 3.5–5.2)
Sodium: 138 mmol/L (ref 134–144)
TOTAL PROTEIN: 7.1 g/dL (ref 6.0–8.5)

## 2016-01-16 ENCOUNTER — Other Ambulatory Visit: Payer: Self-pay

## 2016-01-17 DIAGNOSIS — Z23 Encounter for immunization: Secondary | ICD-10-CM | POA: Diagnosis not present

## 2016-03-10 ENCOUNTER — Other Ambulatory Visit: Payer: Self-pay | Admitting: Unknown Physician Specialty

## 2016-03-12 ENCOUNTER — Other Ambulatory Visit: Payer: Self-pay | Admitting: Unknown Physician Specialty

## 2016-03-12 DIAGNOSIS — N183 Chronic kidney disease, stage 3 (moderate): Secondary | ICD-10-CM | POA: Diagnosis not present

## 2016-03-12 DIAGNOSIS — N2581 Secondary hyperparathyroidism of renal origin: Secondary | ICD-10-CM | POA: Diagnosis not present

## 2016-03-12 DIAGNOSIS — I129 Hypertensive chronic kidney disease with stage 1 through stage 4 chronic kidney disease, or unspecified chronic kidney disease: Secondary | ICD-10-CM | POA: Diagnosis not present

## 2016-03-12 DIAGNOSIS — E875 Hyperkalemia: Secondary | ICD-10-CM | POA: Diagnosis not present

## 2016-03-12 DIAGNOSIS — E1122 Type 2 diabetes mellitus with diabetic chronic kidney disease: Secondary | ICD-10-CM | POA: Diagnosis not present

## 2016-03-12 NOTE — Telephone Encounter (Signed)
Pharmacy called to request refills for INVOKANA 300 MG TABS tablet.

## 2016-03-12 NOTE — Telephone Encounter (Signed)
Your patient 

## 2016-03-12 NOTE — Telephone Encounter (Signed)
rx filled

## 2016-03-15 ENCOUNTER — Telehealth: Payer: Self-pay | Admitting: Unknown Physician Specialty

## 2016-03-15 NOTE — Telephone Encounter (Signed)
Called and spoke to patient. He asked while on the phone if there was anything he could do while off of the invokana because being off of it really throws his BS off. I placed the patient on hold and went to ask [place]. Elnita MaxwellCheryl stated that the patient would need to be seen to discuss further medication options. I scheduled the patient a medication f/up visit on 03/19/16

## 2016-03-15 NOTE — Telephone Encounter (Signed)
Pt stated that after his appt with [facility] he was told to stop taking his INVOKANA 300 MG TABS tablet for 2 weeks and see if that's what's causing his low kidney function.

## 2016-03-15 NOTE — Telephone Encounter (Signed)
Routing to provider. Jonathan MaxwellCheryl- is this OK for the patient to do?

## 2016-03-15 NOTE — Telephone Encounter (Signed)
Yes, thank you.

## 2016-03-19 ENCOUNTER — Ambulatory Visit (INDEPENDENT_AMBULATORY_CARE_PROVIDER_SITE_OTHER): Payer: Medicare Other | Admitting: Unknown Physician Specialty

## 2016-03-19 ENCOUNTER — Encounter: Payer: Self-pay | Admitting: Unknown Physician Specialty

## 2016-03-19 VITALS — BP 111/66 | HR 75 | Temp 98.0°F | Ht 69.6 in | Wt 210.8 lb

## 2016-03-19 DIAGNOSIS — E1122 Type 2 diabetes mellitus with diabetic chronic kidney disease: Secondary | ICD-10-CM | POA: Diagnosis not present

## 2016-03-19 DIAGNOSIS — N183 Chronic kidney disease, stage 3 (moderate): Secondary | ICD-10-CM

## 2016-03-19 LAB — BAYER DCA HB A1C WAIVED: HB A1C (BAYER DCA - WAIVED): 6.6 % (ref <7.0)

## 2016-03-19 MED ORDER — GLUCOSE BLOOD VI STRP: 1.0000 | ORAL_STRIP | 12 refills | Status: DC | PRN

## 2016-03-19 NOTE — Assessment & Plan Note (Signed)
Hgb A1C is 6.6.  Continue with diet changes.  Stop Invokanna and f/u in 1 month.  Consider Bydureon at that time.

## 2016-03-19 NOTE — Progress Notes (Signed)
   BP 111/66 (BP Location: Left Arm, Patient Position: Sitting, Cuff Size: Large)   Pulse 75   Temp 98 F (36.7 C)   Ht 5' 9.6" (1.768 m)   Wt 210 lb 12.8 oz (95.6 kg)   SpO2 96%   BMI 30.60 kg/m    Subjective:    Patient ID: Jonathan [name], male    DOB: [birth year], 70 y.o.   MRN: [id number]  HPI: Jonathan [name] is a 70 y.o. male  Chief Complaint  Patient presents with  . Medication Follow Up    Invokana    Pt is seeing Dr Ronn MelenaKolloru due to decling kidney functions.  He want him to stop the invokanna to see if GFR improves.  Pt is concerned that if he goes off Invokanna his BS increases.  He has not taken Invokanna for a week with BS around 170.    Relevant past medical, surgical, family and social history reviewed and updated as indicated. Interim medical history since our last visit reviewed. Allergies and medications reviewed and updated.  Review of Systems  Per HPI unless specifically indicated above     Objective:    BP 111/66 (BP Location: Left Arm, Patient Position: Sitting, Cuff Size: Large)   Pulse 75   Temp 98 F (36.7 C)   Ht 5' 9.6" (1.768 m)   Wt 210 lb 12.8 oz (95.6 kg)   SpO2 96%   BMI 30.60 kg/m   Wt Readings from Last 3 Encounters:  03/19/16 210 lb 12.8 oz (95.6 kg)  09/26/15 209 lb 3.2 oz (94.9 kg)  08/08/15 218 lb 14.7 oz (99.3 kg)    Physical Exam  Constitutional: He is oriented to person, place, and time. He appears well-developed and well-nourished. No distress.  HENT:  Head: Normocephalic and atraumatic.  Eyes: Conjunctivae and lids are normal. Right eye exhibits no discharge. Left eye exhibits no discharge. No scleral icterus.  Neck: Normal range of motion. Neck supple. No JVD present. Carotid bruit is not present.  Cardiovascular: Normal rate, regular rhythm and normal heart sounds.   Pulmonary/Chest: Effort normal and breath sounds normal. No respiratory distress.  Abdominal: Normal appearance. There is no splenomegaly or hepatomegaly.    Musculoskeletal: Normal range of motion.  Neurological: He is alert and oriented to person, place, and time.  Skin: Skin is warm, dry and intact. No rash noted. No pallor.  Psychiatric: He has a normal mood and affect. His behavior is normal. Judgment and thought content normal.    Results for orders placed or performed in visit on 10/17/15  HM DIABETES EYE EXAM  Result Value Ref Range   HM Diabetic Eye Exam No Retinopathy No Retinopathy      Assessment & Plan:   Problem List Items Addressed This Visit      Unprioritized   Type 2 diabetes mellitus with chronic kidney disease, without long-term current use of insulin (HCC) - Primary    Hgb A1C is 6.6.  Continue with diet changes.  Stop Invokanna and f/u in 1 month.  Consider Bydureon at that time.        Relevant Orders   Bayer DCA Hb A1c Waived   Comprehensive metabolic panel    Other Visit Diagnoses   None.      Follow up plan: Return in about 4 weeks (around 04/16/2016) for already has an appointment.

## 2016-03-20 LAB — COMPREHENSIVE METABOLIC PANEL
A/G RATIO: 1.9 (ref 1.2–2.2)
ALBUMIN: 4.8 g/dL (ref 3.5–4.8)
ALK PHOS: 71 IU/L (ref 39–117)
ALT: 10 IU/L (ref 0–44)
AST: 16 IU/L (ref 0–40)
BILIRUBIN TOTAL: 0.7 mg/dL (ref 0.0–1.2)
BUN / CREAT RATIO: 19 (ref 10–24)
BUN: 36 mg/dL — ABNORMAL HIGH (ref 8–27)
CO2: 22 mmol/L (ref 18–29)
CREATININE: 1.93 mg/dL — AB (ref 0.76–1.27)
Calcium: 9.7 mg/dL (ref 8.6–10.2)
Chloride: 100 mmol/L (ref 96–106)
GFR calc Af Amer: 40 mL/min/{1.73_m2} — ABNORMAL LOW (ref >59)
GFR calc non Af Amer: 34 mL/min/{1.73_m2} — ABNORMAL LOW (ref >59)
GLOBULIN, TOTAL: 2.5 g/dL (ref 1.5–4.5)
Glucose: 93 mg/dL (ref 65–99)
Potassium: 5.7 mmol/L — ABNORMAL HIGH (ref 3.5–5.2)
SODIUM: 139 mmol/L (ref 134–144)
Total Protein: 7.3 g/dL (ref 6.0–8.5)

## 2016-03-22 NOTE — Progress Notes (Signed)
Notified pt by mychart

## 2016-03-23 ENCOUNTER — Telehealth: Payer: Self-pay | Admitting: Unknown Physician Specialty

## 2016-03-23 NOTE — Telephone Encounter (Signed)
Called and spoke to patient to find out brand of strips and how often he is testing. Patient stated he uses One Touch and checks sugar twice daily. Will fill out form, have provider sign, and fax to pharmacy.

## 2016-03-23 NOTE — Telephone Encounter (Signed)
Pharmacy called and would like to have another rx sent over for test strips with a diagnosis code and specific instructions.

## 2016-03-23 NOTE — Telephone Encounter (Signed)
[facility]

## 2016-03-29 DIAGNOSIS — I10 Essential (primary) hypertension: Secondary | ICD-10-CM | POA: Diagnosis not present

## 2016-03-29 DIAGNOSIS — N183 Chronic kidney disease, stage 3 (moderate): Secondary | ICD-10-CM | POA: Diagnosis not present

## 2016-03-29 DIAGNOSIS — E1129 Type 2 diabetes mellitus with other diabetic kidney complication: Secondary | ICD-10-CM | POA: Diagnosis not present

## 2016-04-23 ENCOUNTER — Encounter: Payer: Medicare Other | Admitting: Unknown Physician Specialty

## 2016-05-04 ENCOUNTER — Encounter: Payer: Self-pay | Admitting: Unknown Physician Specialty

## 2016-05-04 ENCOUNTER — Ambulatory Visit (INDEPENDENT_AMBULATORY_CARE_PROVIDER_SITE_OTHER): Payer: Medicare Other | Admitting: Unknown Physician Specialty

## 2016-05-04 VITALS — BP 145/82 | HR 77 | Temp 98.4°F | Ht 69.0 in | Wt 220.0 lb

## 2016-05-04 DIAGNOSIS — N183 Chronic kidney disease, stage 3 unspecified: Secondary | ICD-10-CM

## 2016-05-04 DIAGNOSIS — E782 Mixed hyperlipidemia: Secondary | ICD-10-CM

## 2016-05-04 DIAGNOSIS — E1122 Type 2 diabetes mellitus with diabetic chronic kidney disease: Secondary | ICD-10-CM | POA: Diagnosis not present

## 2016-05-04 DIAGNOSIS — Z0000 Encounter for general adult medical examination without abnormal findings: Secondary | ICD-10-CM

## 2016-05-04 DIAGNOSIS — I129 Hypertensive chronic kidney disease with stage 1 through stage 4 chronic kidney disease, or unspecified chronic kidney disease: Secondary | ICD-10-CM | POA: Diagnosis not present

## 2016-05-04 LAB — BAYER DCA HB A1C WAIVED: HB A1C (BAYER DCA - WAIVED): 6.8 % (ref <7.0)

## 2016-05-04 NOTE — Assessment & Plan Note (Signed)
Hgb A1C is 6.8.  Pt c/o weight gain since stopping Invokanna.  Discussed diet and exercise changes

## 2016-05-04 NOTE — Progress Notes (Signed)
BP (!) 145/82 (BP Location: Left Arm, Cuff Size: Large)   Pulse 77   Temp 98.4 F (36.9 C)   Ht 5\' 9"  (1.753 m)   Wt 220 lb (99.8 kg)   SpO2 96%   BMI 32.49 kg/m    Subjective:    Patient ID: Jonathan [name], male    DOB: 10-30-1945, 70 y.o.   MRN: [id number]  HPI: Jonathan [name] is a 70 y.o. male  Chief Complaint  Patient presents with  . Medicare Wellness  . Diabetes    pt states he will schedule eye exam after the first of the year   Diabetes: Using medications without difficulties.  C/O weight gain off of Invokana No hypoglycemic episodes No hyperglycemic episodes Feet problems: none Blood Sugars averaging: 120-130 until recently and now a little higher eye exam within last year Last Hgb A1C: 6.6%  Hypertension  Using medications without difficulty Average home BPs 120s/70s   Using medication without problems or lightheadedness No chest pain with exertion or shortness of breath No Edema  Elevated Cholesterol Using medications without problems No Muscle aches  Diet/Exercise: Not made changes  Functional Status Survey: Is the patient deaf or have difficulty hearing?: No Does the patient have difficulty seeing, even when wearing glasses/contacts?: No Does the patient have difficulty concentrating, remembering, or making decisions?: No Does the patient have difficulty walking or climbing stairs?: No Does the patient have difficulty dressing or bathing?: No Does the patient have difficulty doing errands alone such as visiting a doctor's office or shopping?: No  Fall Risk  05/04/2016 03/19/2016 01/16/2016 12/11/2014  Falls in the past year? No No No No   [facility] 2[facility] 05/04/2016 05/04/2016 03/19/2016 12/11/2014  Decreased Interest 1 1 0 0  Down, Depressed, Hopeless 1 1 0 0  PHQ - 2 Score 2 2 0 0   Mini cog is normal  Relevant past medical, surgical, family and social history reviewed and updated as indicated. Interim medical history since our last  visit reviewed. Allergies and medications reviewed and updated.  Review of Systems  Per HPI unless specifically indicated above     Objective:    BP (!) 145/82 (BP Location: Left Arm, Cuff Size: Large)   Pulse 77   Temp 98.4 F (36.9 C)   Ht 5\' 9"  (1.753 m)   Wt 220 lb (99.8 kg)   SpO2 96%   BMI 32.49 kg/m   Wt Readings from Last 3 Encounters:  05/04/16 220 lb (99.8 kg)  03/19/16 210 lb 12.8 oz (95.6 kg)  09/26/15 209 lb 3.2 oz (94.9 kg)    Physical Exam  Constitutional: He is oriented to person, place, and time. He appears well-developed and well-nourished.  HENT:  Head: Normocephalic.  Right Ear: Tympanic membrane, external ear and ear canal normal.  Left Ear: Tympanic membrane, external ear and ear canal normal.  Mouth/Throat: Uvula is midline, oropharynx is clear and moist and mucous membranes are normal.  Eyes: Pupils are equal, round, and reactive to light.  Cardiovascular: Normal rate, regular rhythm and normal heart sounds.  Exam reveals no gallop and no friction rub.   No murmur heard. Pulmonary/Chest: Effort normal and breath sounds normal. No respiratory distress.  Abdominal: Soft. Bowel sounds are normal. He exhibits no distension. There is no tenderness.  Musculoskeletal: Normal range of motion.  Neurological: He is alert and oriented to person, place, and time. He has normal reflexes.  Skin: Skin is warm and dry.  Psychiatric:  He has a normal mood and affect. His behavior is normal. Judgment and thought content normal.    Results for orders placed or performed in visit on 03/19/16  Bayer DCA Hb A1c Waived  Result Value Ref Range   Bayer DCA Hb A1c Waived 6.6 <7.0 %  Comprehensive metabolic panel  Result Value Ref Range   Glucose 93 65 - 99 mg/dL   BUN 36 (H) 8 - 27 mg/dL   Creatinine, Ser 1.611.93 (H) 0.76 - 1.27 mg/dL   GFR calc non Af Amer 34 (L) >59 mL/min/1.73   GFR calc Af Amer 40 (L) >59 mL/min/1.73   BUN/Creatinine Ratio 19 10 - 24   Sodium 139  134 - 144 mmol/L   Potassium 5.7 (H) 3.5 - 5.2 mmol/L   Chloride 100 96 - 106 mmol/L   CO2 22 18 - 29 mmol/L   Calcium 9.7 8.6 - 10.2 mg/dL   Total Protein 7.3 6.0 - 8.5 g/dL   Albumin 4.8 3.5 - 4.8 g/dL   Globulin, Total 2.5 1.5 - 4.5 g/dL   Albumin/Globulin Ratio 1.9 1.2 - 2.2   Bilirubin Total 0.7 0.0 - 1.2 mg/dL   Alkaline Phosphatase 71 39 - 117 IU/L   AST 16 0 - 40 IU/L   ALT 10 0 - 44 IU/L      Assessment & Plan:   Problem List Items Addressed This Visit      Unprioritized   Hyperlipidemia   Relevant Orders   Lipid Panel w/o Chol/HDL Ratio   Hypertensive CKD (chronic kidney disease)    Stable at home.  Continue present meds      Relevant Orders   Comprehensive metabolic panel   Type 2 diabetes mellitus with chronic kidney disease, without long-term current use of insulin (HCC) - Primary    Hgb A1C is 6.8.  Pt c/o weight gain since stopping Invokanna.  Discussed diet and exercise changes      Relevant Orders   Comprehensive metabolic panel   Bayer DCA Hb [id number] Waived    Other Visit Diagnoses    Annual physical exam           Follow up plan: Return in about 3 months (around 08/02/2016).

## 2016-05-04 NOTE — Assessment & Plan Note (Signed)
Stable at home.  Continue present meds

## 2016-05-05 LAB — COMPREHENSIVE METABOLIC PANEL
ALK PHOS: 82 IU/L (ref 39–117)
ALT: 14 IU/L (ref 0–44)
AST: 20 IU/L (ref 0–40)
Albumin/Globulin Ratio: 2.1 (ref 1.2–2.2)
Albumin: 4.5 g/dL (ref 3.5–4.8)
BILIRUBIN TOTAL: 0.5 mg/dL (ref 0.0–1.2)
BUN/Creatinine Ratio: 18 (ref 10–24)
BUN: 20 mg/dL (ref 8–27)
CHLORIDE: 100 mmol/L (ref 96–106)
CO2: 26 mmol/L (ref 18–29)
Calcium: 9.4 mg/dL (ref 8.6–10.2)
Creatinine, Ser: 1.13 mg/dL (ref 0.76–1.27)
GFR calc non Af Amer: 65 mL/min/{1.73_m2} (ref >59)
GFR, EST AFRICAN AMERICAN: 76 mL/min/{1.73_m2} (ref >59)
GLUCOSE: 145 mg/dL — AB (ref 65–99)
Globulin, Total: 2.1 g/dL (ref 1.5–4.5)
POTASSIUM: 4.7 mmol/L (ref 3.5–5.2)
Sodium: 141 mmol/L (ref 134–144)
TOTAL PROTEIN: 6.6 g/dL (ref 6.0–8.5)

## 2016-05-05 LAB — LIPID PANEL W/O CHOL/HDL RATIO
CHOLESTEROL TOTAL: 149 mg/dL (ref 100–199)
HDL: 53 mg/dL (ref >39)
LDL Calculated: 74 mg/dL (ref 0–99)
TRIGLYCERIDES: 110 mg/dL (ref 0–149)
VLDL CHOLESTEROL CAL: 22 mg/dL (ref 5–40)

## 2016-05-06 ENCOUNTER — Other Ambulatory Visit: Payer: Self-pay | Admitting: Unknown Physician Specialty

## 2016-05-25 DIAGNOSIS — E782 Mixed hyperlipidemia: Secondary | ICD-10-CM | POA: Diagnosis not present

## 2016-05-25 DIAGNOSIS — I480 Paroxysmal atrial fibrillation: Secondary | ICD-10-CM | POA: Diagnosis not present

## 2016-05-25 DIAGNOSIS — I10 Essential (primary) hypertension: Secondary | ICD-10-CM | POA: Diagnosis not present

## 2016-05-25 DIAGNOSIS — I6523 Occlusion and stenosis of bilateral carotid arteries: Secondary | ICD-10-CM | POA: Diagnosis not present

## 2016-05-25 DIAGNOSIS — E119 Type 2 diabetes mellitus without complications: Secondary | ICD-10-CM | POA: Diagnosis not present

## 2016-05-28 LAB — HM DIABETES EYE EXAM

## 2016-06-14 ENCOUNTER — Other Ambulatory Visit: Payer: Self-pay | Admitting: Unknown Physician Specialty

## 2016-08-11 ENCOUNTER — Ambulatory Visit: Payer: Medicare Other | Admitting: Unknown Physician Specialty

## 2016-08-13 ENCOUNTER — Inpatient Hospital Stay: Payer: Medicare Other | Attending: Internal Medicine | Admitting: Internal Medicine

## 2016-08-13 ENCOUNTER — Inpatient Hospital Stay: Payer: Medicare Other

## 2016-08-13 VITALS — BP 123/72 | HR 72 | Temp 97.5°F | Resp 18 | Wt 222.1 lb

## 2016-08-13 DIAGNOSIS — E119 Type 2 diabetes mellitus without complications: Secondary | ICD-10-CM | POA: Diagnosis not present

## 2016-08-13 DIAGNOSIS — Z87891 Personal history of nicotine dependence: Secondary | ICD-10-CM | POA: Insufficient documentation

## 2016-08-13 DIAGNOSIS — I10 Essential (primary) hypertension: Secondary | ICD-10-CM | POA: Diagnosis not present

## 2016-08-13 DIAGNOSIS — D696 Thrombocytopenia, unspecified: Secondary | ICD-10-CM | POA: Diagnosis not present

## 2016-08-13 DIAGNOSIS — Z7982 Long term (current) use of aspirin: Secondary | ICD-10-CM | POA: Diagnosis not present

## 2016-08-13 DIAGNOSIS — E785 Hyperlipidemia, unspecified: Secondary | ICD-10-CM | POA: Insufficient documentation

## 2016-08-13 DIAGNOSIS — Z79899 Other long term (current) drug therapy: Secondary | ICD-10-CM | POA: Diagnosis not present

## 2016-08-13 DIAGNOSIS — J45909 Unspecified asthma, uncomplicated: Secondary | ICD-10-CM | POA: Insufficient documentation

## 2016-08-13 DIAGNOSIS — Z7984 Long term (current) use of oral hypoglycemic drugs: Secondary | ICD-10-CM | POA: Diagnosis not present

## 2016-08-13 LAB — CBC WITH DIFFERENTIAL/PLATELET
BASOS ABS: 0 10*3/uL (ref 0–0.1)
Basophils Relative: 1 %
EOS PCT: 4 %
Eosinophils Absolute: 0.2 10*3/uL (ref 0–0.7)
HCT: 42.4 % (ref 40.0–52.0)
Hemoglobin: 14.5 g/dL (ref 13.0–18.0)
LYMPHS PCT: 28 %
Lymphs Abs: 1.5 10*3/uL (ref 1.0–3.6)
MCH: 28.5 pg (ref 26.0–34.0)
MCHC: 34.2 g/dL (ref 32.0–36.0)
MCV: 83.3 fL (ref 80.0–100.0)
Monocytes Absolute: 0.6 10*3/uL (ref 0.2–1.0)
Monocytes Relative: 11 %
Neutro Abs: 3 10*3/uL (ref 1.4–6.5)
Neutrophils Relative %: 56 %
PLATELETS: 134 10*3/uL — AB (ref 150–440)
RBC: 5.09 MIL/uL (ref 4.40–5.90)
RDW: 15.8 % — ABNORMAL HIGH (ref 11.5–14.5)
WBC: 5.2 10*3/uL (ref 3.8–10.6)

## 2016-08-13 LAB — COMPREHENSIVE METABOLIC PANEL
ALT: 15 U/L — AB (ref 17–63)
AST: 23 U/L (ref 15–41)
Albumin: 4.2 g/dL (ref 3.5–5.0)
Alkaline Phosphatase: 73 U/L (ref 38–126)
Anion gap: 7 (ref 5–15)
BUN: 27 mg/dL — ABNORMAL HIGH (ref 6–20)
CHLORIDE: 104 mmol/L (ref 101–111)
CO2: 23 mmol/L (ref 22–32)
CREATININE: 1.49 mg/dL — AB (ref 0.61–1.24)
Calcium: 9.1 mg/dL (ref 8.9–10.3)
GFR calc Af Amer: 53 mL/min — ABNORMAL LOW (ref >60)
GFR, EST NON AFRICAN AMERICAN: 45 mL/min — AB (ref >60)
GLUCOSE: 196 mg/dL — AB (ref 65–99)
Potassium: 4.9 mmol/L (ref 3.5–5.1)
SODIUM: 134 mmol/L — AB (ref 135–145)
Total Bilirubin: 0.9 mg/dL (ref 0.3–1.2)
Total Protein: 7.3 g/dL (ref 6.5–8.1)

## 2016-08-13 NOTE — Progress Notes (Signed)
[facility] OFFICE PROGRESS NOTE  Patient Care Team: Kathrine Haddock, NP as PCP - General (Nurse Practitioner) Corey Skains, MD as Consulting Physician (Cardiology) Lavonia Dana, MD as Consulting Physician (Nephrology)   SUMMARY OF ONCOLOGIC HISTORY:  # CHRONIC THROMBOCYTOPENIA [90s-130s]; ? ITP; surveillance US- spleen- neg.   # CKD creat- 1.3-1.5 [Dr.Kolluru]  INTERVAL HISTORY:  A pleasant 71 year old male patient with above history of chronic thrombocytopenia is here for follow-up. Patient denies any bleeding. Denies any easy bruising. Denies any weight loss. Denies any night sweats. No lumps or bumps. He has been recently taken off his diabetic medication which was thought to be contributing to his worsening kidney function.   REVIEW OF SYSTEMS:  A complete 10 point review of system is done which is negative except mentioned above/history of present illness.   PAST MEDICAL HISTORY :  Past Medical History:  Diagnosis Date  . Asthma   . Diabetes mellitus without complication ([place])   . Hyperlipidemia   . Hypertension   . Lymphopenia   . Thrombocytopenia ([place])     PAST SURGICAL HISTORY :   Past Surgical History:  Procedure Laterality Date  . heart ablation    . KNEE SURGERY Bilateral     FAMILY HISTORY :   Family History  Problem Relation Age of Onset  . Hypertension Mother   . Lung cancer Father     lung  . Lung disease Father   . Hypertension Father   . Cancer Sister     breast    SOCIAL HISTORY:   Social History  Substance Use Topics  . Smoking status: Former Smoker    Packs/day: 1.00    Years: 35.00    Types: Cigarettes    Quit date: 03/06/1978  . Smokeless tobacco: Former Systems developer    Quit date: 03/21/2007  . Alcohol use 0.0 - 1.8 oz/week    ALLERGIES:  has No Known Allergies.  MEDICATIONS:  Current Outpatient Prescriptions  Medication Sig Dispense Refill  . aspirin 81 MG tablet Take 81 mg by mouth daily.    Marland Kitchen atorvastatin  (LIPITOR) 80 MG tablet Take 1 tablet (80 mg total) by mouth daily. 90 tablet 0  . cholecalciferol (VITAMIN D) 1000 units tablet Take 1,000 Units by mouth daily.    Marland Kitchen glucose blood test strip 1 each by Other route as needed for other. Use as instructed 100 each 12  . levothyroxine (SYNTHROID, LEVOTHROID) 137 MCG tablet TAKE 1 TABLET (137 MCG TOTAL) BY MOUTH DAILY. 90 tablet 1  . linagliptin (TRADJENTA) 5 MG TABS tablet Take 1 tablet (5 mg total) by mouth daily. 30 tablet 6  . lisinopril (PRINIVIL,ZESTRIL) 20 MG tablet Take 1 tablet (20 mg total) by mouth daily. 90 tablet 3  . magnesium oxide (MAG-OX) 400 MG tablet Take 1 tablet (400 mg total) by mouth daily. 90 tablet 1  . metFORMIN (GLUCOPHAGE) 500 MG tablet Take 1 tablet (500 mg total) by mouth 2 (two) times daily with a meal. 180 tablet 3  . metoprolol (LOPRESSOR) 50 MG tablet TAKE 1 TABLET (50 MG TOTAL) BY MOUTH 2 (TWO) TIMES DAILY. 180 tablet 1  . Omega-3 Fatty Acids (FISH OIL PO) Take 306 mg by mouth daily.    . vitamin B-12 (CYANOCOBALAMIN) 1000 MCG tablet Take 1,000 mcg by mouth daily.     No current facility-administered medications for this visit.     PHYSICAL EXAMINATION:   BP 123/72 (BP Location: Left Arm, Patient Position: Sitting)  Pulse 72   Temp 97.5 F (36.4 C) (Tympanic)   Resp 18   Wt 222 lb 2 oz (100.8 kg)   BMI 32.80 kg/m   Filed Weights   08/13/16 1048  Weight: 222 lb 2 oz (100.8 kg)    GENERAL: Well-nourished well-developed; Alert, no distress and comfortable. Accompanied by family.  EYES: no pallor or icterus OROPHARYNX: no thrush or ulceration; good dentition  NECK: supple, no masses felt LYMPH:  no palpable lymphadenopathy in the cervical, axillary or inguinal regions LUNGS: clear to auscultation and  No wheeze or crackles HEART/CVS: regular rate & rhythm and no murmurs; No lower extremity edema ABDOMEN:abdomen soft, non-tender and normal bowel sounds Musculoskeletal:no cyanosis of digits and no  clubbing  PSYCH: alert & oriented x 3 with fluent speech NEURO: no focal motor/sensory deficits SKIN:  no rashes or significant lesions  LABORATORY DATA:  I have reviewed the data as listed    Component Value Date/Time   NA 134 (L) 08/13/2016 1028   NA 141 05/04/2016 1117   NA 140 11/15/2011 0709   K 4.9 08/13/2016 1028   K 5.0 11/15/2011 0709   CL 104 08/13/2016 1028   CL 107 11/15/2011 0709   CO2 23 08/13/2016 1028   CO2 19 (L) 11/15/2011 0709   GLUCOSE 196 (H) 08/13/2016 1028   GLUCOSE 173 (H) 11/15/2011 0709   BUN 27 (H) 08/13/2016 1028   BUN 20 05/04/2016 1117   BUN 28 (H) 11/15/2011 0709   CREATININE 1.49 (H) 08/13/2016 1028   CREATININE 1.41 (H) 11/15/2011 0709   CALCIUM 9.1 08/13/2016 1028   CALCIUM 8.7 11/15/2011 0709   PROT 7.3 08/13/2016 1028   PROT 6.6 05/04/2016 1117   PROT 7.1 06/06/2011 0528   ALBUMIN 4.2 08/13/2016 1028   ALBUMIN 4.5 05/04/2016 1117   ALBUMIN 3.9 06/06/2011 0528   AST 23 08/13/2016 1028   AST 24 06/06/2011 0528   ALT 15 (L) 08/13/2016 1028   ALT 25 06/06/2011 0528   ALKPHOS 73 08/13/2016 1028   ALKPHOS 74 06/06/2011 0528   BILITOT 0.9 08/13/2016 1028   BILITOT 0.5 05/04/2016 1117   BILITOT 0.6 06/06/2011 0528   GFRNONAA 45 (L) 08/13/2016 1028   GFRNONAA 52 (L) 11/15/2011 0709   GFRAA 53 (L) 08/13/2016 1028   GFRAA 60 (L) 11/15/2011 0709    No results found for: SPEP, UPEP  Lab Results  Component Value Date   WBC 5.2 08/13/2016   NEUTROABS 3.0 08/13/2016   HGB 14.5 08/13/2016   HCT 42.4 08/13/2016   MCV 83.3 08/13/2016   PLT 134 (L) 08/13/2016      Chemistry      Component Value Date/Time   NA 134 (L) 08/13/2016 1028   NA 141 05/04/2016 1117   NA 140 11/15/2011 0709   K 4.9 08/13/2016 1028   K 5.0 11/15/2011 0709   CL 104 08/13/2016 1028   CL 107 11/15/2011 0709   CO2 23 08/13/2016 1028   CO2 19 (L) 11/15/2011 0709   BUN 27 (H) 08/13/2016 1028   BUN 20 05/04/2016 1117   BUN 28 (H) 11/15/2011 0709   CREATININE  1.49 (H) 08/13/2016 1028   CREATININE 1.41 (H) 11/15/2011 0709      Component Value Date/Time   CALCIUM 9.1 08/13/2016 1028   CALCIUM 8.7 11/15/2011 0709   ALKPHOS 73 08/13/2016 1028   ALKPHOS 74 06/06/2011 0528   AST 23 08/13/2016 1028   AST 24 06/06/2011 0528   ALT 15 (  L) 08/13/2016 1028   ALT 25 06/06/2011 0528   BILITOT 0.9 08/13/2016 1028   BILITOT 0.5 05/04/2016 1117   BILITOT 0.6 06/06/2011 0528         ASSESSMENT & PLAN:   Thrombocytopenia ([place]) # Chronic thrombocytopenia- since 2013 platelets in range of 120- 90s. Normal hemoglobin and white count. Likely a benign process like ITP. However this is asymptomatic. I recommend surveillance for now.  # Discussed with the patient that if his platelets start dropping/ his symptomatic I would recommend further workup including a possible bone marrow biopsy to rule out myelodysplastic syndrome/less likely.  # Patient prefers to follow up with PCP; states he has labs every 3 months. However he is agreeable to follow up in one year with us/with labs. He will call us if his symptomatic in between.     Cammie Sickle, MD 08/13/2016 5:33 PM

## 2016-08-13 NOTE — Progress Notes (Signed)
Patient here today for follow up.  Patient states no new concerns today  

## 2016-08-13 NOTE — Assessment & Plan Note (Addendum)
#  Chronic thrombocytopenia- since 2013 platelets in range of 120- 90s. Normal hemoglobin and white count. Likely a benign process like ITP. However this is asymptomatic. I recommend surveillance for now.  # Discussed with the patient that if his platelets start dropping/ his symptomatic I would recommend further workup including a possible bone marrow biopsy to rule out myelodysplastic syndrome/less likely.  # Patient prefers to follow up with PCP; states he has labs every 3 months. However he is agreeable to follow up in one year with us/with labs. He will call us if his symptomatic in between.

## 2016-08-20 ENCOUNTER — Encounter: Payer: Self-pay | Admitting: Unknown Physician Specialty

## 2016-08-20 ENCOUNTER — Ambulatory Visit (INDEPENDENT_AMBULATORY_CARE_PROVIDER_SITE_OTHER): Payer: Medicare Other | Admitting: Unknown Physician Specialty

## 2016-08-20 VITALS — BP 111/66 | HR 69 | Temp 97.7°F | Wt 215.6 lb

## 2016-08-20 DIAGNOSIS — N183 Chronic kidney disease, stage 3 unspecified: Secondary | ICD-10-CM

## 2016-08-20 DIAGNOSIS — D696 Thrombocytopenia, unspecified: Secondary | ICD-10-CM | POA: Diagnosis not present

## 2016-08-20 DIAGNOSIS — D638 Anemia in other chronic diseases classified elsewhere: Secondary | ICD-10-CM

## 2016-08-20 DIAGNOSIS — E1122 Type 2 diabetes mellitus with diabetic chronic kidney disease: Secondary | ICD-10-CM | POA: Diagnosis not present

## 2016-08-20 DIAGNOSIS — I129 Hypertensive chronic kidney disease with stage 1 through stage 4 chronic kidney disease, or unspecified chronic kidney disease: Secondary | ICD-10-CM | POA: Diagnosis not present

## 2016-08-20 LAB — BAYER DCA HB A1C WAIVED: HB A1C (BAYER DCA - WAIVED): 7.6 % — ABNORMAL HIGH

## 2016-08-20 NOTE — Progress Notes (Signed)
BP 111/66 (BP Location: Left Arm, Patient Position: Sitting, Cuff Size: Normal)   Pulse 69   Temp 97.7 F (36.5 C)   Wt 215 lb 9.6 oz (97.8 kg)   SpO2 95%   BMI 31.84 kg/m    Subjective:    Patient ID: Jonathan [name], male    DOB: 1945-06-17, 71 y.o.   MRN: [id number]  HPI: Jonathan [name] is a 71 y.o. male  Chief Complaint  Patient presents with  . Diabetes    pt states he had eye exam with [facility] recently, will fax form to them  . Hyperlipidemia  . Hypertension   Diabetes: Using medications without difficulties No hypoglycemic episodes No hyperglycemic episodes Feet problems: none Blood Sugars averaging: 180s in the AM but improves later in the day eye exam within last year Last Hgb A1C: 6.8  Hypertension  Using medications without difficulty Average home BPsPt reports good numbers at home  Using medication without problems or lightheadedness No chest pain with exertion or shortness of breath No Edema  Elevated Cholesterol Using medications without problems No Muscle aches  Diet: Exercise: Working on diet and exercise.  States he is more active  Seeing Dr Ronn Melena for CKD and Dr. Roderic Palau for thrombocytopenia  Relevant past medical, surgical, family and social history reviewed and updated as indicated. Interim medical history since our last visit reviewed. Allergies and medications reviewed and updated.  Review of Systems  Per HPI unless specifically indicated above     Objective:    BP 111/66 (BP Location: Left Arm, Patient Position: Sitting, Cuff Size: Normal)   Pulse 69   Temp 97.7 F (36.5 C)   Wt 215 lb 9.6 oz (97.8 kg)   SpO2 95%   BMI 31.84 kg/m   Wt Readings from Last 3 Encounters:  08/20/16 215 lb 9.6 oz (97.8 kg)  08/13/16 222 lb 2 oz (100.8 kg)  05/04/16 220 lb (99.8 kg)    Physical Exam  Constitutional: He is oriented to person, place, and time. He appears well-developed and well-nourished. No distress.  HENT:  Head:  Normocephalic and atraumatic.  Eyes: Conjunctivae and lids are normal. Right eye exhibits no discharge. Left eye exhibits no discharge. No scleral icterus.  Neck: Normal range of motion. Neck supple. No JVD present. Carotid bruit is not present.  Cardiovascular: Normal rate, regular rhythm and normal heart sounds.   Pulmonary/Chest: Effort normal and breath sounds normal. No respiratory distress.  Abdominal: Normal appearance. There is no splenomegaly or hepatomegaly.  Musculoskeletal: Normal range of motion.  Neurological: He is alert and oriented to person, place, and time.  Skin: Skin is warm, dry and intact. No rash noted. No pallor.  Psychiatric: He has a normal mood and affect. His behavior is normal. Judgment and thought content normal.    Results for orders placed or performed in visit on 08/13/16  CBC with Differential  Result Value Ref Range   WBC 5.2 3.8 - 10.6 K/uL   RBC 5.09 4.40 - 5.90 MIL/uL   Hemoglobin 14.5 13.0 - 18.0 g/dL   HCT 78.2 95.6 - 21.3 %   MCV 83.3 80.0 - 100.0 fL   MCH 28.5 26.0 - 34.0 pg   MCHC 34.2 32.0 - 36.0 g/dL   RDW 08.6 (H) 57.8 - 46.9 %   Platelets 134 (L) 150 - 440 K/uL   Neutrophils Relative % 56 %   Neutro Abs 3.0 1.4 - 6.5 K/uL   Lymphocytes Relative 28 %  Lymphs Abs 1.5 1.0 - 3.6 K/uL   Monocytes Relative 11 %   Monocytes Absolute 0.6 0.2 - 1.0 K/uL   Eosinophils Relative 4 %   Eosinophils Absolute 0.2 0 - 0.7 K/uL   Basophils Relative 1 %   Basophils Absolute 0.0 0 - 0.1 K/uL  Comprehensive metabolic panel  Result Value Ref Range   Sodium 134 (L) 135 - 145 mmol/L   Potassium 4.9 3.5 - 5.1 mmol/L   Chloride 104 101 - 111 mmol/L   CO2 23 22 - 32 mmol/L   Glucose, Bld 196 (H) 65 - 99 mg/dL   BUN 27 (H) 6 - 20 mg/dL   Creatinine, Ser 4.09 (H) 0.61 - 1.24 mg/dL   Calcium 9.1 8.9 - 81.1 mg/dL   Total Protein 7.3 6.5 - 8.1 g/dL   Albumin 4.2 3.5 - 5.0 g/dL   AST 23 15 - 41 U/L   ALT 15 (L) 17 - 63 U/L   Alkaline Phosphatase 73 38 -  126 U/L   Total Bilirubin 0.9 0.3 - 1.2 mg/dL   GFR calc non Af Amer 45 (L) >60 mL/min   GFR calc Af Amer 53 (L) >60 mL/min   Anion gap 7 5 - 15      Assessment & Plan:   Problem List Items Addressed This Visit      Unprioritized   CKD (chronic kidney disease), stage III    I appreciate the contribution from Dr. Ronn Melena.  Reviewed note.   GFR is stable in the mid 40's.         Hypertensive CKD (chronic kidney disease)    Stable, continue present medications.        Thrombocytopenia ([facility])    Reviewed last labs.  Platelet count is improved at 134.  Sill recheck in 3-6 months.  I appreciate the contribution from Dr. Roderic Palau.  Not reviewed         Type 2 diabetes mellitus with chronic kidney disease, without long-term current use of insulin ([facility]) - Primary    Worsening with Hgb A1C increasing from 6.8 to 7.6.  Pt states he is planning to get out more with the warmer weather.  Consider GLP 1 next visit      Relevant Orders   Comprehensive metabolic panel   Bayer DCA Hb B1Y Waived    Other Visit Diagnoses    Anemia of chronic disease        Anemia of chronic disease entered in error and removed from problem list  Follow up plan: Return in about 3 months (around 11/20/2016).

## 2016-08-20 NOTE — Assessment & Plan Note (Signed)
Stable.  Recheck in 6 months

## 2016-08-20 NOTE — Assessment & Plan Note (Addendum)
Worsening with Hgb A1C increasing from 6.8 to 7.6.  Pt states he is planning to get out more with the warmer weather.  Consider GLP 1 next visit

## 2016-08-20 NOTE — Assessment & Plan Note (Signed)
Stable, continue present medications.   

## 2016-08-20 NOTE — Assessment & Plan Note (Addendum)
I appreciate the contribution from Dr. Ronn Melena.  Reviewed note.   GFR is stable in the mid 40's.

## 2016-08-20 NOTE — Assessment & Plan Note (Signed)
Reviewed last labs.  Platelet count is improved at 134.  Sill recheck in 3-6 months.  I appreciate the contribution from Dr. Roderic Palau.  Not reviewed

## 2016-08-21 LAB — COMPREHENSIVE METABOLIC PANEL
A/G RATIO: 1.7 (ref 1.2–2.2)
ALT: 12 IU/L (ref 0–44)
AST: 16 IU/L (ref 0–40)
Albumin: 4.4 g/dL (ref 3.5–4.8)
Alkaline Phosphatase: 87 IU/L (ref 39–117)
BUN/Creatinine Ratio: 24 (ref 10–24)
BUN: 32 mg/dL — AB (ref 8–27)
Bilirubin Total: 0.7 mg/dL (ref 0.0–1.2)
CALCIUM: 9.3 mg/dL (ref 8.6–10.2)
CO2: 19 mmol/L (ref 18–29)
CREATININE: 1.36 mg/dL — AB (ref 0.76–1.27)
Chloride: 99 mmol/L (ref 96–106)
GFR, EST AFRICAN AMERICAN: 60 mL/min/{1.73_m2} (ref >59)
GFR, EST NON AFRICAN AMERICAN: 52 mL/min/{1.73_m2} — AB (ref >59)
Globulin, Total: 2.6 g/dL (ref 1.5–4.5)
Glucose: 155 mg/dL — ABNORMAL HIGH (ref 65–99)
POTASSIUM: 4.5 mmol/L (ref 3.5–5.2)
Sodium: 136 mmol/L (ref 134–144)
TOTAL PROTEIN: 7 g/dL (ref 6.0–8.5)

## 2016-09-09 DIAGNOSIS — I129 Hypertensive chronic kidney disease with stage 1 through stage 4 chronic kidney disease, or unspecified chronic kidney disease: Secondary | ICD-10-CM | POA: Diagnosis not present

## 2016-09-09 DIAGNOSIS — E1122 Type 2 diabetes mellitus with diabetic chronic kidney disease: Secondary | ICD-10-CM | POA: Diagnosis not present

## 2016-09-09 DIAGNOSIS — N183 Chronic kidney disease, stage 3 (moderate): Secondary | ICD-10-CM | POA: Diagnosis not present

## 2016-09-09 DIAGNOSIS — N2581 Secondary hyperparathyroidism of renal origin: Secondary | ICD-10-CM | POA: Diagnosis not present

## 2016-09-09 DIAGNOSIS — D631 Anemia in chronic kidney disease: Secondary | ICD-10-CM | POA: Diagnosis not present

## 2016-09-09 DIAGNOSIS — M25819 Other specified joint disorders, unspecified shoulder: Secondary | ICD-10-CM | POA: Diagnosis not present

## 2016-09-28 ENCOUNTER — Other Ambulatory Visit: Payer: Self-pay | Admitting: Unknown Physician Specialty

## 2016-10-16 ENCOUNTER — Other Ambulatory Visit: Payer: Self-pay | Admitting: Unknown Physician Specialty

## 2016-10-26 ENCOUNTER — Other Ambulatory Visit: Payer: Self-pay | Admitting: Unknown Physician Specialty

## 2016-11-11 ENCOUNTER — Other Ambulatory Visit: Payer: Self-pay | Admitting: Unknown Physician Specialty

## 2016-11-26 ENCOUNTER — Encounter: Payer: Self-pay | Admitting: Unknown Physician Specialty

## 2016-11-26 ENCOUNTER — Ambulatory Visit (INDEPENDENT_AMBULATORY_CARE_PROVIDER_SITE_OTHER): Payer: Medicare Other | Admitting: Unknown Physician Specialty

## 2016-11-26 VITALS — BP 112/65 | HR 71 | Temp 98.5°F | Ht 70.0 in | Wt 213.8 lb

## 2016-11-26 DIAGNOSIS — E1122 Type 2 diabetes mellitus with diabetic chronic kidney disease: Secondary | ICD-10-CM

## 2016-11-26 DIAGNOSIS — I129 Hypertensive chronic kidney disease with stage 1 through stage 4 chronic kidney disease, or unspecified chronic kidney disease: Secondary | ICD-10-CM

## 2016-11-26 DIAGNOSIS — N183 Chronic kidney disease, stage 3 unspecified: Secondary | ICD-10-CM

## 2016-11-26 DIAGNOSIS — E782 Mixed hyperlipidemia: Secondary | ICD-10-CM | POA: Diagnosis not present

## 2016-11-26 DIAGNOSIS — E538 Deficiency of other specified B group vitamins: Secondary | ICD-10-CM

## 2016-11-26 LAB — BAYER DCA HB A1C WAIVED: HB A1C: 7.3 % — AB (ref <7.0)

## 2016-11-26 MED ORDER — ATORVASTATIN CALCIUM 80 MG PO TABS: 80.0000 mg | ORAL_TABLET | Freq: Every day | ORAL | 0 refills | Status: DC

## 2016-11-26 MED ORDER — METOPROLOL TARTRATE 50 MG PO TABS: 50.0000 mg | ORAL_TABLET | Freq: Two times a day (BID) | ORAL | 1 refills | Status: DC

## 2016-11-26 NOTE — Progress Notes (Signed)
BP 112/65   Pulse 71   Temp 98.5 F (36.9 C)   Ht 5\' 10"  (1.778 m)   Wt 213 lb 12.8 oz (97 kg)   SpO2 98%   BMI 30.68 kg/m    Subjective:    Patient ID: Jonathan [name], male    DOB: Apr 27, 1946, 71 y.o.   MRN: [id number]  HPI: Jonathan [name] is a 71 y.o. male  Chief Complaint  Patient presents with  . Diabetes  . Hyperlipidemia  . Hypertension   Diabetes: Using medications without difficulties No hypoglycemic episodes No hyperglycemic episodes Feet problems: none Blood Sugars averaging: "high like always" eye exam within last year Last Hgb A1C: 7.6  Hypertension  Using medications without difficulty Average home BPs not checking   Using medication without problems or lightheadedness No chest pain with exertion or shortness of breath No Edema  Elevated Cholesterol Using medications without problems No Muscle aches  Diet: Exercise: Working and exercising.  States he eats a good diet except last 8 days when he had a stomach virus   Relevant past medical, surgical, family and social history reviewed and updated as indicated. Interim medical history since our last visit reviewed. Allergies and medications reviewed and updated.  Review of Systems  Per HPI unless specifically indicated above     Objective:    BP 112/65   Pulse 71   Temp 98.5 F (36.9 C)   Ht 5\' 10"  (1.778 m)   Wt 213 lb 12.8 oz (97 kg)   SpO2 98%   BMI 30.68 kg/m   Wt Readings from Last 3 Encounters:  11/26/16 213 lb 12.8 oz (97 kg)  08/20/16 215 lb 9.6 oz (97.8 kg)  08/13/16 222 lb 2 oz (100.8 kg)    Physical Exam  Constitutional: He is oriented to person, place, and time. He appears well-developed and well-nourished. No distress.  HENT:  Head: Normocephalic and atraumatic.  Eyes: Conjunctivae and lids are normal. Right eye exhibits no discharge. Left eye exhibits no discharge. No scleral icterus.  Neck: Normal range of motion. Neck supple. No JVD present. Carotid bruit is not  present.  Cardiovascular: Normal rate, regular rhythm and normal heart sounds.   Pulmonary/Chest: Effort normal and breath sounds normal. No respiratory distress.  Abdominal: Normal appearance. There is no splenomegaly or hepatomegaly.  Musculoskeletal: Normal range of motion.  Neurological: He is alert and oriented to person, place, and time.  Skin: Skin is warm, dry and intact. No rash noted. No pallor.  Psychiatric: He has a normal mood and affect. His behavior is normal. Judgment and thought content normal.    Results for orders placed or performed in visit on 08/23/16  HM DIABETES EYE EXAM  Result Value Ref Range   HM Diabetic Eye Exam No Retinopathy No Retinopathy      Assessment & Plan:   Problem List Items Addressed This Visit      Unprioritized   CKD (chronic kidney disease), stage III   Hyperlipidemia    Stable, continue present medications.        Relevant Medications   atorvastatin (LIPITOR) 80 MG tablet   metoprolol tartrate (LOPRESSOR) 50 MG tablet   Other Relevant Orders   Lipid Panel w/o Chol/HDL Ratio   Hypertensive CKD (chronic kidney disease)    Stable, continue present medications.        Type 2 diabetes mellitus with chronic kidney disease, without long-term current use of insulin (HCC) - Primary    Hgb  A1C is 7.3 down from 7.6.  Continue present treatment and recheck 3 months      Relevant Medications   atorvastatin (LIPITOR) 80 MG tablet   Other Relevant Orders   Bayer DCA Hb A1c Waived   Comprehensive metabolic panel   Vitamin B12 deficiency       Follow up plan: Return in about 3 months (around 02/26/2017).

## 2016-11-26 NOTE — Assessment & Plan Note (Signed)
Stable, continue present medications.   

## 2016-11-26 NOTE — Assessment & Plan Note (Signed)
Hgb A1C is 7.3 down from 7.6.  Continue present treatment and recheck 3 months

## 2016-11-27 LAB — COMPREHENSIVE METABOLIC PANEL
A/G RATIO: 1.8 (ref 1.2–2.2)
ALBUMIN: 4.2 g/dL (ref 3.5–4.8)
ALT: 13 IU/L (ref 0–44)
AST: 13 IU/L (ref 0–40)
Alkaline Phosphatase: 89 IU/L (ref 39–117)
BUN / CREAT RATIO: 20 (ref 10–24)
BUN: 31 mg/dL — ABNORMAL HIGH (ref 8–27)
Bilirubin Total: 0.6 mg/dL (ref 0.0–1.2)
CALCIUM: 9.6 mg/dL (ref 8.6–10.2)
CO2: 21 mmol/L (ref 20–29)
Chloride: 100 mmol/L (ref 96–106)
Creatinine, Ser: 1.55 mg/dL — ABNORMAL HIGH (ref 0.76–1.27)
GFR, EST AFRICAN AMERICAN: 51 mL/min/{1.73_m2} — AB (ref >59)
GFR, EST NON AFRICAN AMERICAN: 44 mL/min/{1.73_m2} — AB (ref >59)
Globulin, Total: 2.4 g/dL (ref 1.5–4.5)
Glucose: 152 mg/dL — ABNORMAL HIGH (ref 65–99)
POTASSIUM: 5.3 mmol/L — AB (ref 3.5–5.2)
SODIUM: 135 mmol/L (ref 134–144)
Total Protein: 6.6 g/dL (ref 6.0–8.5)

## 2016-11-27 LAB — LIPID PANEL W/O CHOL/HDL RATIO
Cholesterol, Total: 127 mg/dL (ref 100–199)
HDL: 51 mg/dL (ref >39)
LDL CALC: 50 mg/dL (ref 0–99)
Triglycerides: 130 mg/dL (ref 0–149)
VLDL Cholesterol Cal: 26 mg/dL (ref 5–40)

## 2017-01-16 DIAGNOSIS — Z23 Encounter for immunization: Secondary | ICD-10-CM | POA: Diagnosis not present

## 2017-02-22 DIAGNOSIS — E1122 Type 2 diabetes mellitus with diabetic chronic kidney disease: Secondary | ICD-10-CM | POA: Diagnosis not present

## 2017-02-22 DIAGNOSIS — I129 Hypertensive chronic kidney disease with stage 1 through stage 4 chronic kidney disease, or unspecified chronic kidney disease: Secondary | ICD-10-CM | POA: Diagnosis not present

## 2017-02-22 DIAGNOSIS — E875 Hyperkalemia: Secondary | ICD-10-CM | POA: Diagnosis not present

## 2017-02-22 DIAGNOSIS — R809 Proteinuria, unspecified: Secondary | ICD-10-CM | POA: Diagnosis not present

## 2017-02-22 DIAGNOSIS — N183 Chronic kidney disease, stage 3 (moderate): Secondary | ICD-10-CM | POA: Diagnosis not present

## 2017-02-23 DIAGNOSIS — E782 Mixed hyperlipidemia: Secondary | ICD-10-CM | POA: Diagnosis not present

## 2017-02-23 DIAGNOSIS — E119 Type 2 diabetes mellitus without complications: Secondary | ICD-10-CM | POA: Diagnosis not present

## 2017-02-23 DIAGNOSIS — I10 Essential (primary) hypertension: Secondary | ICD-10-CM | POA: Diagnosis not present

## 2017-02-23 DIAGNOSIS — I480 Paroxysmal atrial fibrillation: Secondary | ICD-10-CM | POA: Diagnosis not present

## 2017-02-25 ENCOUNTER — Encounter: Payer: Self-pay | Admitting: Unknown Physician Specialty

## 2017-02-25 ENCOUNTER — Ambulatory Visit (INDEPENDENT_AMBULATORY_CARE_PROVIDER_SITE_OTHER): Payer: Medicare Other | Admitting: Unknown Physician Specialty

## 2017-02-25 VITALS — BP 121/69 | HR 79 | Temp 97.4°F | Wt 220.6 lb

## 2017-02-25 DIAGNOSIS — E1122 Type 2 diabetes mellitus with diabetic chronic kidney disease: Secondary | ICD-10-CM | POA: Diagnosis not present

## 2017-02-25 DIAGNOSIS — N183 Chronic kidney disease, stage 3 unspecified: Secondary | ICD-10-CM

## 2017-02-25 DIAGNOSIS — I129 Hypertensive chronic kidney disease with stage 1 through stage 4 chronic kidney disease, or unspecified chronic kidney disease: Secondary | ICD-10-CM

## 2017-02-25 DIAGNOSIS — E782 Mixed hyperlipidemia: Secondary | ICD-10-CM | POA: Diagnosis not present

## 2017-02-25 LAB — BAYER DCA HB A1C WAIVED: HB A1C: 8.2 % — AB (ref <7.0)

## 2017-02-25 MED ORDER — DULAGLUTIDE 0.75 MG/0.5ML ~~LOC~~ SOAJ: 0.7500 mg | SUBCUTANEOUS | 2 refills | Status: DC

## 2017-02-25 NOTE — Assessment & Plan Note (Signed)
Stable, continue present medications.   

## 2017-02-25 NOTE — Assessment & Plan Note (Addendum)
Hgb A1C is 8.2% and worsening.  Taking Metformin renally dosed.  On Trajenta.  Pt already watches what he eats and exercises.  Will DC Trajenta an start Trulicity at .75 mg weekly.  First dose in office

## 2017-02-25 NOTE — Progress Notes (Signed)
BP 121/69   Pulse 79   Temp (!) 97.4 F (36.3 C)   Wt 220 lb 9.6 oz (100.1 kg)   SpO2 97%   BMI 31.65 kg/m    Subjective:    Patient ID: Jonathan [name], male    DOB: Sep 19, 1945, 71 y.o.   MRN: [id number]  HPI: Jonathan [name] is a 71 y.o. male  Chief Complaint  Patient presents with  . Diabetes  . Hyperlipidemia  . Hypertension   Diabetes: Using medications without difficulties No hypoglycemic episodes No hyperglycemic episodes Feet problems:none Blood Sugars averaging: On occasion and varies but not change from previous Last Hgb A1C:7.3  Hypertension  Using medications without difficulty Average home BPs 124/69  Using medication without problems or lightheadedness No chest pain with exertion or shortness of breath No Edema  Elevated Cholesterol Using medications without problems No Muscle aches  Diet: Exercise: Does all he can and still working  Relevant past medical, surgical, family and social history reviewed and updated as indicated. Interim medical history since our last visit reviewed. Allergies and medications reviewed and updated.  Review of Systems  Per HPI unless specifically indicated above     Objective:    BP 121/69   Pulse 79   Temp (!) 97.4 F (36.3 C)   Wt 220 lb 9.6 oz (100.1 kg)   SpO2 97%   BMI 31.65 kg/m   Wt Readings from Last 3 Encounters:  02/25/17 220 lb 9.6 oz (100.1 kg)  11/26/16 213 lb 12.8 oz (97 kg)  08/20/16 215 lb 9.6 oz (97.8 kg)    Physical Exam  Constitutional: He is oriented to person, place, and time. He appears well-developed and well-nourished. No distress.  HENT:  Head: Normocephalic and atraumatic.  Eyes: Conjunctivae and lids are normal. Right eye exhibits no discharge. Left eye exhibits no discharge. No scleral icterus.  Neck: Normal range of motion. Neck supple. No JVD present. Carotid bruit is not present.  Cardiovascular: Normal rate, regular rhythm and normal heart sounds.   Pulmonary/Chest: Effort  normal and breath sounds normal. No respiratory distress.  Abdominal: Normal appearance. There is no splenomegaly or hepatomegaly.  Musculoskeletal: Normal range of motion.  Neurological: He is alert and oriented to person, place, and time.  Skin: Skin is warm, dry and intact. No rash noted. No pallor.  Psychiatric: He has a normal mood and affect. His behavior is normal. Judgment and thought content normal.    Results for orders placed or performed in visit on 11/26/16  Bayer DCA Hb A1c Waived  Result Value Ref Range   Bayer DCA Hb A1c Waived 7.3 (H) <7.0 %  Comprehensive metabolic panel  Result Value Ref Range   Glucose 152 (H) 65 - 99 mg/dL   BUN 31 (H) 8 - 27 mg/dL   Creatinine, Ser 4.09 (H) 0.76 - 1.27 mg/dL   GFR calc non Af Amer 44 (L) >59 mL/min/1.73   GFR calc Af Amer 51 (L) >59 mL/min/1.73   BUN/Creatinine Ratio 20 10 - 24   Sodium 135 134 - 144 mmol/L   Potassium 5.3 (H) 3.5 - 5.2 mmol/L   Chloride 100 96 - 106 mmol/L   CO2 21 20 - 29 mmol/L   Calcium 9.6 8.6 - 10.2 mg/dL   Total Protein 6.6 6.0 - 8.5 g/dL   Albumin 4.2 3.5 - 4.8 g/dL   Globulin, Total 2.4 1.5 - 4.5 g/dL   Albumin/Globulin Ratio 1.8 1.2 - 2.2   Bilirubin Total  0.6 0.0 - 1.2 mg/dL   Alkaline Phosphatase 89 39 - 117 IU/L   AST 13 0 - 40 IU/L   ALT 13 0 - 44 IU/L  Lipid Panel w/o Chol/HDL Ratio  Result Value Ref Range   Cholesterol, Total 127 100 - 199 mg/dL   Triglycerides 098 0 - 149 mg/dL   HDL 51 >11 mg/dL   VLDL Cholesterol Cal 26 5 - 40 mg/dL   LDL Calculated 50 0 - 99 mg/dL      Assessment & Plan:   Problem List Items Addressed This Visit      Unprioritized   Hyperlipidemia    Stable, continue present medications.        Hypertensive CKD (chronic kidney disease)    Stable, continue present medications.        Relevant Orders   Comprehensive metabolic panel   Type 2 diabetes mellitus with chronic kidney disease, without long-term current use of insulin (HCC) - Primary    Hgb  A1C is 8.2% and worsening.  Taking Metformin renally dosed.  On Trajenta.  Pt already watches what he eats and exercises.  Will DC Trajenta an start Trulicity at .75 mg weekly.  First dose in office      Relevant Medications   Dulaglutide (TRULICITY) 0.75 MG/0.5ML SOPN   Other Relevant Orders   Bayer DCA Hb A1c Waived       Follow up plan: Return in about 3 months (around 05/28/2017).

## 2017-02-26 LAB — COMPREHENSIVE METABOLIC PANEL
A/G RATIO: 1.7 (ref 1.2–2.2)
ALT: 10 IU/L (ref 0–44)
AST: 14 IU/L (ref 0–40)
Albumin: 4.5 g/dL (ref 3.5–4.8)
Alkaline Phosphatase: 103 IU/L (ref 39–117)
BUN/Creatinine Ratio: 20 (ref 10–24)
BUN: 32 mg/dL — ABNORMAL HIGH (ref 8–27)
Bilirubin Total: 0.6 mg/dL (ref 0.0–1.2)
CALCIUM: 9.5 mg/dL (ref 8.6–10.2)
CO2: 21 mmol/L (ref 20–29)
Chloride: 99 mmol/L (ref 96–106)
Creatinine, Ser: 1.58 mg/dL — ABNORMAL HIGH (ref 0.76–1.27)
GFR, EST AFRICAN AMERICAN: 50 mL/min/{1.73_m2} — AB (ref >59)
GFR, EST NON AFRICAN AMERICAN: 43 mL/min/{1.73_m2} — AB (ref >59)
Globulin, Total: 2.6 g/dL (ref 1.5–4.5)
Glucose: 186 mg/dL — ABNORMAL HIGH (ref 65–99)
POTASSIUM: 5.6 mmol/L — AB (ref 3.5–5.2)
Sodium: 137 mmol/L (ref 134–144)
TOTAL PROTEIN: 7.1 g/dL (ref 6.0–8.5)

## 2017-02-28 NOTE — Progress Notes (Signed)
Normal labs.  Pt notified through mychart

## 2017-05-03 ENCOUNTER — Encounter: Payer: Self-pay | Admitting: Unknown Physician Specialty

## 2017-05-03 ENCOUNTER — Other Ambulatory Visit: Payer: Self-pay | Admitting: Unknown Physician Specialty

## 2017-05-06 ENCOUNTER — Ambulatory Visit (INDEPENDENT_AMBULATORY_CARE_PROVIDER_SITE_OTHER): Payer: Medicare Other

## 2017-05-06 VITALS — BP 148/72 | HR 81 | Temp 97.5°F | Resp 15 | Ht 71.0 in | Wt 227.5 lb

## 2017-05-06 DIAGNOSIS — Z1211 Encounter for screening for malignant neoplasm of colon: Secondary | ICD-10-CM

## 2017-05-06 DIAGNOSIS — Z0000 Encounter for general adult medical examination without abnormal findings: Secondary | ICD-10-CM | POA: Diagnosis not present

## 2017-05-06 NOTE — Patient Instructions (Addendum)
Jonathan [name] , Thank you for taking time to come for your Medicare Wellness Visit. I appreciate your ongoing commitment to your health goals. Please review the following plan we discussed and let me know if I can assist you in the future.   Screening recommendations/referrals: Colonoscopy: due now, cologuard ordered Recommended yearly ophthalmology/optometry visit for glaucoma screening and checkup Recommended yearly dental visit for hygiene and checkup  Vaccinations: Influenza vaccine: up to date Pneumococcal vaccine: up to date Tdap vaccine: up to date Shingles vaccine: completed zostavax on 03/15/2012, eligible for shingrix vaccine, check with your insurance company for coverage   Advanced directives: Please bring a copy of your health care power of attorney and living will to the office at your convenience.  Conditions/risks identified: Recommend drinking at least 6-8 glasses of water a day   Next appointment: Follow up in one year for your annual wellness exam.   Preventive Care 71 Years and Older, Male Preventive care refers to lifestyle choices and visits with your health care provider that can promote health and wellness. What does preventive care include?  A yearly physical exam. This is also called an annual well check.  Dental exams once or twice a year.  Routine eye exams. Ask your health care provider how often you should have your eyes checked.  Personal lifestyle choices, including:  Daily care of your teeth and gums.  Regular physical activity.  Eating a healthy diet.  Avoiding tobacco and drug use.  Limiting alcohol use.  Practicing safe sex.  Taking low doses of aspirin every day.  Taking vitamin and mineral supplements as recommended by your health care provider. What happens during an annual well check? The services and screenings done by your health care provider during your annual well check will depend on your age, overall health, lifestyle risk  factors, and family history of disease. Counseling  Your health care provider may ask you questions about your:  Alcohol use.  Tobacco use.  Drug use.  Emotional well-being.  Home and relationship well-being.  Sexual activity.  Eating habits.  History of falls.  Memory and ability to understand (cognition).  Work and work Astronomerenvironment. Screening  You may have the following tests or measurements:  Height, weight, and BMI.  Blood pressure.  Lipid and cholesterol levels. These may be checked every 5 years, or more frequently if you are over 71 years old.  Skin check.  Lung cancer screening. You may have this screening every year starting at age 71 if you have a 30-pack-year history of smoking and currently smoke or have quit within the past 15 years.  Fecal occult blood test (FOBT) of the stool. You may have this test every year starting at age 71.  Flexible sigmoidoscopy or colonoscopy. You may have a sigmoidoscopy every 5 years or a colonoscopy every 10 years starting at age 71.  Prostate cancer screening. Recommendations will vary depending on your family history and other risks.  Hepatitis C blood test.  Hepatitis B blood test.  Sexually transmitted disease (STD) testing.  Diabetes screening. This is done by checking your blood sugar (glucose) after you have not eaten for a while (fasting). You may have this done every 1-3 years.  Abdominal aortic aneurysm (AAA) screening. You may need this if you are a current or former smoker.  Osteoporosis. You may be screened starting at age 71 if you are at high risk. Talk with your health care provider about your test results, treatment options, and if necessary,  the need for more tests. Vaccines  Your health care provider may recommend certain vaccines, such as:  Influenza vaccine. This is recommended every year.  Tetanus, diphtheria, and acellular pertussis (Tdap, Td) vaccine. You may need a Td booster every 10  years.  Zoster vaccine. You may need this after age 66.  Pneumococcal 13-valent conjugate (PCV13) vaccine. One dose is recommended after age 8.  Pneumococcal polysaccharide (PPSV23) vaccine. One dose is recommended after age 75. Talk to your health care provider about which screenings and vaccines you need and how often you need them. This information is not intended to replace advice given to you by your health care provider. Make sure you discuss any questions you have with your health care provider. Document Released: 06/06/2015 Document Revised: 01/28/2016 Document Reviewed: 03/11/2015 Elsevier Interactive Patient Education  2017 [place] Prevention in the Home Falls can cause injuries. They can happen to people of all ages. There are many things you can do to make your home safe and to help prevent falls. What can I do on the outside of my home?  Regularly fix the edges of walkways and driveways and fix any cracks.  Remove anything that might make you trip as you walk through a door, such as a raised step or threshold.  Trim any bushes or trees on the path to your home.  Use bright outdoor lighting.  Clear any walking paths of anything that might make someone trip, such as rocks or tools.  Regularly check to see if handrails are loose or broken. Make sure that both sides of any steps have handrails.  Any raised decks and porches should have guardrails on the edges.  Have any leaves, snow, or ice cleared regularly.  Use sand or salt on walking paths during winter.  Clean up any spills in your garage right away. This includes oil or grease spills. What can I do in the bathroom?  Use night lights.  Install grab bars by the toilet and in the tub and shower. Do not use towel bars as grab bars.  Use non-skid mats or decals in the tub or shower.  If you need to sit down in the shower, use a plastic, non-slip stool.  Keep the floor dry. Clean up any water that  spills on the floor as soon as it happens.  Remove soap buildup in the tub or shower regularly.  Attach bath mats securely with double-sided non-slip rug tape.  Do not have throw rugs and other things on the floor that can make you trip. What can I do in the bedroom?  Use night lights.  Make sure that you have a light by your bed that is easy to reach.  Do not use any sheets or blankets that are too big for your bed. They should not hang down onto the floor.  Have a firm chair that has side arms. You can use this for support while you get dressed.  Do not have throw rugs and other things on the floor that can make you trip. What can I do in the kitchen?  Clean up any spills right away.  Avoid walking on wet floors.  Keep items that you use a lot in easy-to-reach places.  If you need to reach something above you, use a strong step stool that has a grab bar.  Keep electrical cords out of the way.  Do not use floor polish or wax that makes floors slippery. If you must use  wax, use non-skid floor wax.  Do not have throw rugs and other things on the floor that can make you trip. What can I do with my stairs?  Do not leave any items on the stairs.  Make sure that there are handrails on both sides of the stairs and use them. Fix handrails that are broken or loose. Make sure that handrails are as long as the stairways.  Check any carpeting to make sure that it is firmly attached to the stairs. Fix any carpet that is loose or worn.  Avoid having throw rugs at the top or bottom of the stairs. If you do have throw rugs, attach them to the floor with carpet tape.  Make sure that you have a light switch at the top of the stairs and the bottom of the stairs. If you do not have them, ask someone to add them for you. What else can I do to help prevent falls?  Wear shoes that:  Do not have high heels.  Have rubber bottoms.  Are comfortable and fit you well.  Are closed at the  toe. Do not wear sandals.  If you use a stepladder:  Make sure that it is fully opened. Do not climb a closed stepladder.  Make sure that both sides of the stepladder are locked into place.  Ask someone to hold it for you, if possible.  Clearly mark and make sure that you can see:  Any grab bars or handrails.  First and last steps.  Where the edge of each step is.  Use tools that help you move around (mobility aids) if they are needed. These include:  Canes.  Walkers.  Scooters.  Crutches.  Turn on the lights when you go into a dark area. Replace any light bulbs as soon as they burn out.  Set up your furniture so you have a clear path. Avoid moving your furniture around.  If any of your floors are uneven, fix them.  If there are any pets around you, be aware of where they are.  Review your medicines with your doctor. Some medicines can make you feel dizzy. This can increase your chance of falling. Ask your doctor what other things that you can do to help prevent falls. This information is not intended to replace advice given to you by your health care provider. Make sure you discuss any questions you have with your health care provider. Document Released: 03/06/2009 Document Revised: 10/16/2015 Document Reviewed: 06/14/2014 Elsevier Interactive Patient Education  2017 [employer].

## 2017-05-06 NOTE — Progress Notes (Signed)
Subjective:   Jonathan [name] is a 71 y.o. male who presents for Medicare Annual/Subsequent preventive examination.  Review of Systems:  Cardiac Risk Factors include: hypertension;advanced age (>5955men, 62>65 women);male gender;obesity (BMI >30kg/m2);dyslipidemia     Objective:    Vitals: BP (!) 148/72 (BP Location: Left Arm, Patient Position: Sitting)   Pulse 81   Temp (!) 97.5 F (36.4 C) (Oral)   Resp 15   Ht 5\' 11"  (1.803 m)   Wt 227 lb 8 oz (103.2 kg)   BMI 31.73 kg/m   Body mass index is 31.73 kg/m.  Advanced Directives 05/06/2017 08/13/2016 05/04/2016 02/07/2015  Does Patient Have a Medical Advance Directive? Yes Yes Yes Yes  Type of Estate agentAdvance Directive Healthcare Power of [place]orney;Living will Healthcare Power of [place];Living will Healthcare Power of [place];Living will Healthcare Power of [place];Living will  Does patient want to make changes to medical advance directive? - - - No - Patient declined  Copy of Healthcare Power of Attorney in Chart? No - copy requested No - copy requested - No - copy requested    Tobacco Social History   Tobacco Use  Smoking Status Former Smoker  . Packs/day: 1.00  . Years: 35.00  . Pack years: 35.00  . Types: Cigarettes  . Last attempt to quit: 03/06/1978  . Years since quitting: 39.1  Smokeless Tobacco Former NeurosurgeonUser  . Quit date: 03/21/2007     Counseling given: Not Answered   Clinical Intake:  Pre-visit preparation completed: Yes  Pain : No/denies pain     Nutritional Status: BMI > 30  Obese Nutritional Risks: None Diabetes: No  How often do you need to have someone help you when you read instructions, pamphlets, or other written materials from your doctor or pharmacy?: 1 - Never What is the last grade level you completed in school?: 2.5 years college   Interpreter Needed?: No  Information entered by :: Melyna Huron,LPN   Past Medical History:  Diagnosis Date  . Asthma   . Diabetes mellitus without complication  (HCC)   . Hyperlipidemia   . Hypertension   . Lymphopenia   . Thrombocytopenia (HCC)    Past Surgical History:  Procedure Laterality Date  . heart ablation    . KNEE SURGERY Bilateral    Family History  Problem Relation Age of Onset  . Hypertension Mother   . Lung cancer Father        lung  . Lung disease Father   . Hypertension Father   . Cancer Sister        breast   Social History   Socioeconomic History  . Marital status: Married    Spouse name: None  . Number of children: None  . Years of education: None  . Highest education level: None  Social Needs  . Financial resource strain: Not hard at all  . Food insecurity - worry: Never true  . Food insecurity - inability: Never true  . Transportation needs - medical: No  . Transportation needs - non-medical: No  Occupational History  . None  Tobacco Use  . Smoking status: Former Smoker    Packs/day: 1.00    Years: 35.00    Pack years: 35.00    Types: Cigarettes    Last attempt to quit: 03/06/1978    Years since quitting: 39.1  . Smokeless tobacco: Former NeurosurgeonUser    Quit date: 03/21/2007  Substance and Sexual Activity  . Alcohol use: Yes    Alcohol/week: 1.2 - 3.0  oz    Types: 2 Shots of liquor per week    Comment: mixed drink 1-2x week  . Drug use: No  . Sexual activity: Yes  Other Topics Concern  . None  Social History Narrative  . None    Outpatient Encounter Medications as of 05/06/2017  Medication Sig  . aspirin 81 MG tablet Take 81 mg by mouth daily.  Marland Kitchen. atorvastatin (LIPITOR) 80 MG tablet Take 1 tablet (80 mg total) by mouth daily.  . cholecalciferol (VITAMIN D) 1000 units tablet Take 1,000 Units by mouth daily.  Marland Kitchen. glucose blood test strip 1 each by Other route as needed for other. Use as instructed  . levothyroxine (SYNTHROID, LEVOTHROID) 137 MCG tablet TAKE 1 TABLET (137 MCG TOTAL) BY MOUTH DAILY.  Marland Kitchen. lisinopril (PRINIVIL,ZESTRIL) 20 MG tablet TAKE 1 TABLET (20 MG TOTAL) BY MOUTH DAILY.  .  magnesium oxide (MAG-OX) 400 MG tablet Take 1 tablet (400 mg total) by mouth daily.  . metFORMIN (GLUCOPHAGE) 500 MG tablet TAKE 1 TABLET (500 MG TOTAL) BY MOUTH 2 (TWO) TIMES DAILY WITH A MEAL.  . metoprolol tartrate (LOPRESSOR) 50 MG tablet Take 1 tablet (50 mg total) by mouth 2 (two) times daily.  . Omega-3 Fatty Acids (FISH OIL PO) Take 1,200 mg by mouth daily.   . TRULICITY 0.75 MG/0.5ML SOPN INJECT 0.75 MG INTO THE SKIN ONCE A WEEK.  . vitamin B-12 (CYANOCOBALAMIN) 1000 MCG tablet Take 1,000 mcg by mouth daily.   No facility-administered encounter medications on file as of 05/06/2017.     Activities of Daily Living In your present state of health, do you have any difficulty performing the following activities: 05/06/2017  Hearing? N  Vision? N  Difficulty concentrating or making decisions? N  Walking or climbing stairs? N  Dressing or bathing? N  Doing errands, shopping? N  Preparing Food and eating ? N  Using the Toilet? N  In the past six months, have you accidently leaked urine? N  Do you have problems with loss of bowel control? N  Managing your Medications? N  Managing your Finances? N  Housekeeping or managing your Housekeeping? N  Some recent data might be hidden    Patient Care Team: Gabriel CirriWicker, Cheryl, NP as PCP - General (Nurse Practitioner) Lamar BlinksKowalski, Bruce J, MD as Consulting Physician (Cardiology) Lamont DowdyKolluru, Sarath, MD as Consulting Physician (Nephrology)   Assessment:   This is a routine wellness examination for Jonathan [name].  Exercise Activities and Dietary recommendations Current Exercise Habits: The patient has a physically strenous job, but has no regular exercise apart from work., Exercise limited by: None identified  Goals    . DIET - INCREASE WATER INTAKE     Recommend drinking at least 6-8 glasses of water a day        Fall Risk Fall Risk  05/06/2017 05/04/2016 03/19/2016 01/16/2016 12/11/2014  Falls in the past year? No No No No No  Comment - - - Emmi  Telephone Survey: data to providers prior to load -   Is the patient's home free of loose throw rugs in walkways, pet beds, electrical cords, etc?   yes      Grab bars in the bathroom? no      Handrails on the stairs?   Doesn't have stairs      Adequate lighting?   yes  Timed Get Up and Go Performed: completed in 6 seconds with no use of assistive devices, steady gait. No intervention needed at this time.   Depression  Screen PHQ 2/9 Scores 05/06/2017 05/04/2016 05/04/2016 03/19/2016  PHQ - 2 Score 0 2 2 0    Cognitive Function     6CIT Screen 05/06/2017  What Year? 0 points  What month? 0 points  What time? 0 points  Count back from 20 0 points  Months in reverse 0 points  Repeat phrase 0 points  Total Score 0    Immunization History  Administered Date(s) Administered  . Influenza, High Dose Seasonal PF 01/17/2016, 01/16/2017  . Influenza-Unspecified 02/17/2015  . Pneumococcal Conjugate-13 09/18/2013  . Pneumococcal Polysaccharide-23 05/21/2014  . Td 03/27/2007  . Tdap 09/18/2013  . Zoster 03/15/2012    Qualifies for Shingles Vaccine? Completed zostavax 03/15/2012, discussed option for shingrix  Screening Tests Health Maintenance  Topic Date Due  . COLONOSCOPY  05/25/2015  . OPHTHALMOLOGY EXAM  05/28/2017  . HEMOGLOBIN A1C  08/26/2017  . FOOT EXAM  11/26/2017  . TETANUS/TDAP  09/19/2023  . INFLUENZA VACCINE  Completed  . Hepatitis C Screening  Completed  . PNA vac Low Risk Adult  Completed   Cancer Screenings: Lung: Low Dose CT Chest recommended if Age 51-80 years, 30 pack-year currently smoking OR have quit w/in 15years. Patient does not qualify. Colorectal: due , cologuard ordered  Additional Screenings:  Hepatitis B/HIV/Syphillis: not indicated Hepatitis C Screening: completed 09/02/2014    Plan:    I have personally reviewed and addressed the Medicare Annual Wellness questionnaire and have noted the following in the patient's chart:  A. Medical and  social history B. Use of alcohol, tobacco or illicit drugs  C. Current medications and supplements D. Functional ability and status E.  Nutritional status F.  Physical activity G. Advance directives H. List of other physicians I.  Hospitalizations, surgeries, and ER visits in previous 12 months J.  Vitals K. Screenings such as hearing and vision if needed, cognitive and depression L. Referrals and appointments   In addition, I have reviewed and discussed with patient certain preventive protocols, quality metrics, and best practice recommendations. A written personalized care plan for preventive services as well as general preventive health recommendations were provided to patient.   Signed,  Marin Roberts, LPN Nurse Health Advisor   Nurse Notes:none

## 2017-05-07 ENCOUNTER — Other Ambulatory Visit: Payer: Self-pay | Admitting: Unknown Physician Specialty

## 2017-05-28 ENCOUNTER — Other Ambulatory Visit: Payer: Self-pay | Admitting: Unknown Physician Specialty

## 2017-05-31 DIAGNOSIS — Z1212 Encounter for screening for malignant neoplasm of rectum: Secondary | ICD-10-CM | POA: Diagnosis not present

## 2017-05-31 DIAGNOSIS — Z1211 Encounter for screening for malignant neoplasm of colon: Secondary | ICD-10-CM | POA: Diagnosis not present

## 2017-05-31 LAB — COLOGUARD: COLOGUARD: NEGATIVE

## 2017-06-10 ENCOUNTER — Ambulatory Visit: Payer: Medicare Other | Admitting: Unknown Physician Specialty

## 2017-06-10 LAB — COLOGUARD: Cologuard: NEGATIVE

## 2017-06-17 ENCOUNTER — Encounter: Payer: Self-pay | Admitting: Unknown Physician Specialty

## 2017-06-17 ENCOUNTER — Other Ambulatory Visit: Payer: Self-pay | Admitting: Unknown Physician Specialty

## 2017-06-17 ENCOUNTER — Ambulatory Visit (INDEPENDENT_AMBULATORY_CARE_PROVIDER_SITE_OTHER): Payer: Medicare Other | Admitting: Unknown Physician Specialty

## 2017-06-17 VITALS — BP 129/70 | HR 81 | Temp 97.6°F | Ht 70.0 in | Wt 216.0 lb

## 2017-06-17 DIAGNOSIS — D696 Thrombocytopenia, unspecified: Secondary | ICD-10-CM | POA: Diagnosis not present

## 2017-06-17 DIAGNOSIS — E1122 Type 2 diabetes mellitus with diabetic chronic kidney disease: Secondary | ICD-10-CM | POA: Diagnosis not present

## 2017-06-17 DIAGNOSIS — I129 Hypertensive chronic kidney disease with stage 1 through stage 4 chronic kidney disease, or unspecified chronic kidney disease: Secondary | ICD-10-CM | POA: Diagnosis not present

## 2017-06-17 DIAGNOSIS — E782 Mixed hyperlipidemia: Secondary | ICD-10-CM | POA: Diagnosis not present

## 2017-06-17 DIAGNOSIS — N183 Chronic kidney disease, stage 3 (moderate): Secondary | ICD-10-CM

## 2017-06-17 LAB — BAYER DCA HB A1C WAIVED: HB A1C (BAYER DCA - WAIVED): 7.9 % — ABNORMAL HIGH (ref <7.0)

## 2017-06-17 MED ORDER — DULAGLUTIDE 1.5 MG/0.5ML ~~LOC~~ SOAJ: 1.5000 mg | SUBCUTANEOUS | 12 refills | Status: DC

## 2017-06-17 NOTE — Assessment & Plan Note (Signed)
Hgb A1C is 7.9, down from 8.2.  Increase Trulicity 1.5 mg.  Reviewed injection process.

## 2017-06-17 NOTE — Assessment & Plan Note (Signed)
Stable, continue present medications.   

## 2017-06-17 NOTE — Progress Notes (Signed)
BP 129/70   Pulse 81   Temp 97.6 F (36.4 C) (Oral)   Ht 5\' 10"  (1.778 m)   Wt 216 lb (98 kg)   SpO2 96%   BMI 30.99 kg/m    Subjective:    Patient ID: Jonathan [name], male    DOB: April 22, 1946, 72 y.o.   MRN: [id number]  HPI: Jonathan [name] is a 72 y.o. male  Chief Complaint  Patient presents with  . Diabetes    pt states he has not had his yearly eye exam yet  . Hypertension  . Hypothyroidism   Diabetes: Using medications without difficulties No hypoglycemic episodes No hyperglycemic episodes Feet problems: none Blood Sugars averaging:High in the AM better later in the day Last Hgb A1C: 8.2   Hypertension  Using medications without difficulty Average home BPs Stable at home   Using medication without problems or lightheadedness No chest pain with exertion or shortness of breath No Edema  Elevated Cholesterol Using medications without problems No Muscle aches  Diet: Exercise:  Thrombocytopenia Following with hematology.  Wondering what his platelet count is.    Relevant past medical, surgical, family and social history reviewed and updated as indicated. Interim medical history since our last visit reviewed. Allergies and medications reviewed and updated.  Review of Systems  Constitutional: Negative.   Respiratory: Negative.   Cardiovascular: Negative.   Gastrointestinal: Negative.   Psychiatric/Behavioral: Negative.     Per HPI unless specifically indicated above     Objective:    BP 129/70   Pulse 81   Temp 97.6 F (36.4 C) (Oral)   Ht 5\' 10"  (1.778 m)   Wt 216 lb (98 kg)   SpO2 96%   BMI 30.99 kg/m   Wt Readings from Last 3 Encounters:  06/17/17 216 lb (98 kg)  05/06/17 227 lb 8 oz (103.2 kg)  02/25/17 220 lb 9.6 oz (100.1 kg)    Physical Exam  Constitutional: He is oriented to person, place, and time. He appears well-developed and well-nourished. No distress.  HENT:  Head: Normocephalic and atraumatic.  Eyes: Conjunctivae and lids  are normal. Right eye exhibits no discharge. Left eye exhibits no discharge. No scleral icterus.  Neck: Normal range of motion. Neck supple. No JVD present. Carotid bruit is not present.  Cardiovascular: Normal rate, regular rhythm and normal heart sounds.  Pulmonary/Chest: Effort normal and breath sounds normal. No respiratory distress.  Abdominal: Normal appearance. There is no splenomegaly or hepatomegaly.  Musculoskeletal: Normal range of motion.  Neurological: He is alert and oriented to person, place, and time.  Skin: Skin is warm, dry and intact. No rash noted. No pallor.  Psychiatric: He has a normal mood and affect. His behavior is normal. Judgment and thought content normal.      Assessment & Plan:   Problem List Items Addressed This Visit      Unprioritized   Hyperlipidemia    Check Lipid panel today      Hypertensive CKD (chronic kidney disease)    Stable, continue present medications.        Relevant Orders   Comprehensive metabolic panel   Thrombocytopenia (HCC) - Primary    Check CBC.  F/U with Dr. Jefm [name]      Relevant Orders   CBC with Differential/Platelet   Type 2 diabetes mellitus with chronic kidney disease, without long-term current use of insulin (HCC)    Hgb A1C is 7.9, down from 8.2.  Increase Trulicity 1.5 mg.  Reviewed  injection process.        Relevant Medications   Dulaglutide (TRULICITY) 1.5 MG/0.5ML SOPN   Other Relevant Orders   Bayer DCA Hb A1c Waived       Follow up plan: Return in about 3 months (around 09/15/2017).

## 2017-06-17 NOTE — Assessment & Plan Note (Signed)
Check CBC.  F/U with Dr. Jefm PettyBrahmady

## 2017-06-17 NOTE — Assessment & Plan Note (Signed)
Check Lipid panel today 

## 2017-06-18 LAB — COMPREHENSIVE METABOLIC PANEL
ALK PHOS: 85 IU/L (ref 39–117)
ALT: 15 IU/L (ref 0–44)
AST: 16 IU/L (ref 0–40)
Albumin/Globulin Ratio: 2 (ref 1.2–2.2)
Albumin: 4.7 g/dL (ref 3.5–4.8)
BUN/Creatinine Ratio: 15 (ref 10–24)
BUN: 22 mg/dL (ref 8–27)
Bilirubin Total: 0.5 mg/dL (ref 0.0–1.2)
CALCIUM: 9.8 mg/dL (ref 8.6–10.2)
CO2: 21 mmol/L (ref 20–29)
CREATININE: 1.44 mg/dL — AB (ref 0.76–1.27)
Chloride: 101 mmol/L (ref 96–106)
GFR calc Af Amer: 56 mL/min/{1.73_m2} — ABNORMAL LOW (ref >59)
GFR calc non Af Amer: 49 mL/min/{1.73_m2} — ABNORMAL LOW (ref >59)
GLUCOSE: 146 mg/dL — AB (ref 65–99)
Globulin, Total: 2.4 g/dL (ref 1.5–4.5)
Potassium: 5.2 mmol/L (ref 3.5–5.2)
SODIUM: 139 mmol/L (ref 134–144)
Total Protein: 7.1 g/dL (ref 6.0–8.5)

## 2017-06-18 LAB — CBC WITH DIFFERENTIAL/PLATELET
Basophils Absolute: 0 10*3/uL (ref 0.0–0.2)
Basos: 1 %
EOS (ABSOLUTE): 0.1 10*3/uL (ref 0.0–0.4)
EOS: 3 %
HEMATOCRIT: 43.3 % (ref 37.5–51.0)
Hemoglobin: 14.9 g/dL (ref 13.0–17.7)
IMMATURE GRANS (ABS): 0 10*3/uL (ref 0.0–0.1)
IMMATURE GRANULOCYTES: 0 %
LYMPHS: 32 %
Lymphocytes Absolute: 1.4 10*3/uL (ref 0.7–3.1)
MCH: 28.4 pg (ref 26.6–33.0)
MCHC: 34.4 g/dL (ref 31.5–35.7)
MCV: 83 fL (ref 79–97)
MONOS ABS: 0.4 10*3/uL (ref 0.1–0.9)
Monocytes: 9 %
NEUTROS PCT: 55 %
Neutrophils Absolute: 2.4 10*3/uL (ref 1.4–7.0)
PLATELETS: 121 10*3/uL — AB (ref 150–379)
RBC: 5.25 x10E6/uL (ref 4.14–5.80)
RDW: 13.8 % (ref 12.3–15.4)
WBC: 4.3 10*3/uL (ref 3.4–10.8)

## 2017-06-20 NOTE — Progress Notes (Signed)
Notified pt by mychart

## 2017-07-28 ENCOUNTER — Encounter: Payer: Self-pay | Admitting: Internal Medicine

## 2017-07-28 ENCOUNTER — Telehealth: Payer: Self-pay

## 2017-07-28 NOTE — Telephone Encounter (Signed)
Needs to keep appt, can ask them at that time how often he needs to come to specialist vs getting levels checked at primary care

## 2017-07-28 NOTE — Telephone Encounter (Signed)
Patient's wife notified of what Fleet ContrasRachel said.

## 2017-07-28 NOTE — Telephone Encounter (Signed)
Copied from CRM [id number]. Topic: Inquiry >> Jul 28, 2017 10:20 AM Eston Mouldavis, Cheri B wrote: Reason for CRM: Pts wife called about the pts platelet issue.  She states he sees a cancer Dr about the platelets but wants to know if he can see Gabriel Cirriheryl Wicker for this instead.  He has an apt with Elnita Maxwellheryl on 4/26 and wants to cx the apt he has on March 25 with cancer Dr.     Christella Hartiganouting to provider. Is this ok?

## 2017-08-03 ENCOUNTER — Telehealth: Payer: Self-pay

## 2017-08-03 MED ORDER — DULAGLUTIDE 1.5 MG/0.5ML ~~LOC~~ SOAJ: 1.5000 mg | SUBCUTANEOUS | 1 refills | Status: DC

## 2017-08-03 NOTE — Telephone Encounter (Signed)
Received a fax from the pharmacy requesting that the patient's trulicity be changed to a 90 day RX.

## 2017-08-03 NOTE — Telephone Encounter (Signed)
Rx changed to 90 day.

## 2017-08-15 ENCOUNTER — Ambulatory Visit: Payer: Medicare Other | Admitting: Internal Medicine

## 2017-08-15 ENCOUNTER — Other Ambulatory Visit: Payer: Medicare Other

## 2017-08-31 DIAGNOSIS — I129 Hypertensive chronic kidney disease with stage 1 through stage 4 chronic kidney disease, or unspecified chronic kidney disease: Secondary | ICD-10-CM | POA: Diagnosis not present

## 2017-08-31 DIAGNOSIS — R809 Proteinuria, unspecified: Secondary | ICD-10-CM | POA: Diagnosis not present

## 2017-08-31 DIAGNOSIS — N183 Chronic kidney disease, stage 3 (moderate): Secondary | ICD-10-CM | POA: Diagnosis not present

## 2017-08-31 DIAGNOSIS — E1122 Type 2 diabetes mellitus with diabetic chronic kidney disease: Secondary | ICD-10-CM | POA: Diagnosis not present

## 2017-08-31 DIAGNOSIS — E875 Hyperkalemia: Secondary | ICD-10-CM | POA: Diagnosis not present

## 2017-09-05 DIAGNOSIS — I6523 Occlusion and stenosis of bilateral carotid arteries: Secondary | ICD-10-CM | POA: Diagnosis not present

## 2017-09-05 DIAGNOSIS — I484 Atypical atrial flutter: Secondary | ICD-10-CM | POA: Diagnosis not present

## 2017-09-05 DIAGNOSIS — I10 Essential (primary) hypertension: Secondary | ICD-10-CM | POA: Diagnosis not present

## 2017-09-05 DIAGNOSIS — E782 Mixed hyperlipidemia: Secondary | ICD-10-CM | POA: Diagnosis not present

## 2017-09-05 DIAGNOSIS — Z9889 Other specified postprocedural states: Secondary | ICD-10-CM | POA: Diagnosis not present

## 2017-09-05 DIAGNOSIS — Z8679 Personal history of other diseases of the circulatory system: Secondary | ICD-10-CM | POA: Diagnosis not present

## 2017-09-05 DIAGNOSIS — E119 Type 2 diabetes mellitus without complications: Secondary | ICD-10-CM | POA: Diagnosis not present

## 2017-09-05 DIAGNOSIS — I480 Paroxysmal atrial fibrillation: Secondary | ICD-10-CM | POA: Diagnosis not present

## 2017-09-07 ENCOUNTER — Other Ambulatory Visit: Payer: Self-pay | Admitting: Unknown Physician Specialty

## 2017-09-07 NOTE — Telephone Encounter (Signed)
Your patient 

## 2017-09-12 DIAGNOSIS — I480 Paroxysmal atrial fibrillation: Secondary | ICD-10-CM | POA: Diagnosis not present

## 2017-09-15 DIAGNOSIS — I10 Essential (primary) hypertension: Secondary | ICD-10-CM | POA: Diagnosis not present

## 2017-09-15 DIAGNOSIS — I6523 Occlusion and stenosis of bilateral carotid arteries: Secondary | ICD-10-CM | POA: Diagnosis not present

## 2017-09-15 DIAGNOSIS — E782 Mixed hyperlipidemia: Secondary | ICD-10-CM | POA: Diagnosis not present

## 2017-09-15 DIAGNOSIS — I480 Paroxysmal atrial fibrillation: Secondary | ICD-10-CM | POA: Diagnosis not present

## 2017-09-16 ENCOUNTER — Ambulatory Visit (INDEPENDENT_AMBULATORY_CARE_PROVIDER_SITE_OTHER): Payer: Medicare Other | Admitting: Unknown Physician Specialty

## 2017-09-16 ENCOUNTER — Encounter: Payer: Self-pay | Admitting: Unknown Physician Specialty

## 2017-09-16 VITALS — BP 113/70 | HR 80 | Temp 97.9°F | Ht 70.0 in | Wt 213.8 lb

## 2017-09-16 DIAGNOSIS — E782 Mixed hyperlipidemia: Secondary | ICD-10-CM | POA: Diagnosis not present

## 2017-09-16 DIAGNOSIS — I129 Hypertensive chronic kidney disease with stage 1 through stage 4 chronic kidney disease, or unspecified chronic kidney disease: Secondary | ICD-10-CM

## 2017-09-16 DIAGNOSIS — N183 Chronic kidney disease, stage 3 (moderate): Secondary | ICD-10-CM

## 2017-09-16 DIAGNOSIS — E1122 Type 2 diabetes mellitus with diabetic chronic kidney disease: Secondary | ICD-10-CM

## 2017-09-16 LAB — BAYER DCA HB A1C WAIVED: HB A1C (BAYER DCA - WAIVED): 7.3 % — ABNORMAL HIGH

## 2017-09-16 NOTE — Assessment & Plan Note (Signed)
Hgb A1C 7.3%  This is down.  Will not make any changes at this time.  Suspect will continue to decrease

## 2017-09-16 NOTE — Assessment & Plan Note (Addendum)
Stable, continue present medications.  Check CMP 

## 2017-09-16 NOTE — Progress Notes (Signed)
BP 113/70   Pulse 80   Temp 97.9 F (36.6 C) (Oral)   Ht 5\' 10"  (1.778 m)   Wt 213 lb 12.8 oz (97 kg)   SpO2 97%   BMI 30.68 kg/m    Subjective:    Patient ID: Jonathan [name], male    DOB: 1945/08/21, 72 y.o.   MRN: [id number]  HPI: Jonathan [name] is a 72 y.o. male  Chief Complaint  Patient presents with  . Diabetes  . Hyperlipidemia  . Hypertension   Diabetes: Using medications without difficulties.  Last visit increased Trulicity No hypoglycemic episodes No hyperglycemic episodes Feet problems: none Blood Sugars averaging: eye exam within last year Last Hgb A1C: 7.9  Hypertension  Using medications without difficulty Average home BPs   Using medication without problems or lightheadedness No chest pain with exertion or shortness of breath No Edema  Elevated Cholesterol Using medications without problems No Muscle aches  Diet: Exercise: Excellent diet and exercises  Relevant past medical, surgical, family and social history reviewed and updated as indicated. Interim medical history since our last visit reviewed. Allergies and medications reviewed and updated.  Review of Systems  Per HPI unless specifically indicated above     Objective:    BP 113/70   Pulse 80   Temp 97.9 F (36.6 C) (Oral)   Ht 5\' 10"  (1.778 m)   Wt 213 lb 12.8 oz (97 kg)   SpO2 97%   BMI 30.68 kg/m   Wt Readings from Last 3 Encounters:  09/16/17 213 lb 12.8 oz (97 kg)  06/17/17 216 lb (98 kg)  05/06/17 227 lb 8 oz (103.2 kg)    Physical Exam  Constitutional: He is oriented to person, place, and time. He appears well-developed and well-nourished. No distress.  HENT:  Head: Normocephalic and atraumatic.  Eyes: Conjunctivae and lids are normal. Right eye exhibits no discharge. Left eye exhibits no discharge. No scleral icterus.  Neck: Normal range of motion. Neck supple. No JVD present. Carotid bruit is not present.  Cardiovascular: Normal rate, regular rhythm and normal  heart sounds.  Pulmonary/Chest: Effort normal and breath sounds normal. No respiratory distress.  Abdominal: Normal appearance. There is no splenomegaly or hepatomegaly.  Musculoskeletal: Normal range of motion.  Neurological: He is alert and oriented to person, place, and time.  Skin: Skin is warm, dry and intact. No rash noted. No pallor.  Psychiatric: He has a normal mood and affect. His behavior is normal. Judgment and thought content normal.    Results for orders placed or performed in visit on 06/17/17  Comprehensive metabolic panel  Result Value Ref Range   Glucose 146 (H) 65 - 99 mg/dL   BUN 22 8 - 27 mg/dL   Creatinine, Ser 4.091.44 (H) 0.76 - 1.27 mg/dL   GFR calc non Af Amer 49 (L) >59 mL/min/1.73   GFR calc Af Amer 56 (L) >59 mL/min/1.73   BUN/Creatinine Ratio 15 10 - 24   Sodium 139 134 - 144 mmol/L   Potassium 5.2 3.5 - 5.2 mmol/L   Chloride 101 96 - 106 mmol/L   CO2 21 20 - 29 mmol/L   Calcium 9.8 8.6 - 10.2 mg/dL   Total Protein 7.1 6.0 - 8.5 g/dL   Albumin 4.7 3.5 - 4.8 g/dL   Globulin, Total 2.4 1.5 - 4.5 g/dL   Albumin/Globulin Ratio 2.0 1.2 - 2.2   Bilirubin Total 0.5 0.0 - 1.2 mg/dL   Alkaline Phosphatase 85 39 - 117  IU/L   AST 16 0 - 40 IU/L   ALT 15 0 - 44 IU/L  CBC with Differential/Platelet  Result Value Ref Range   WBC 4.3 3.4 - 10.8 x10E3/uL   RBC 5.25 4.14 - 5.80 x10E6/uL   Hemoglobin 14.9 13.0 - 17.7 g/dL   Hematocrit 40.9 81.1 - 51.0 %   MCV 83 79 - 97 fL   MCH 28.4 26.6 - 33.0 pg   MCHC 34.4 31.5 - 35.7 g/dL   RDW 91.4 78.2 - 95.6 %   Platelets 121 (L) 150 - 379 x10E3/uL   Neutrophils 55 Not Estab. %   Lymphs 32 Not Estab. %   Monocytes 9 Not Estab. %   Eos 3 Not Estab. %   Basos 1 Not Estab. %   Neutrophils Absolute 2.4 1.4 - 7.0 x10E3/uL   Lymphocytes Absolute 1.4 0.7 - 3.1 x10E3/uL   Monocytes Absolute 0.4 0.1 - 0.9 x10E3/uL   EOS (ABSOLUTE) 0.1 0.0 - 0.4 x10E3/uL   Basophils Absolute 0.0 0.0 - 0.2 x10E3/uL   Immature Granulocytes 0  Not Estab. %   Immature Grans (Abs) 0.0 0.0 - 0.1 x10E3/uL  Bayer DCA Hb A1c Waived  Result Value Ref Range   Bayer DCA Hb A1c Waived 7.9 (H) <7.0 %      Assessment & Plan:   Problem List Items Addressed This Visit      Unprioritized   Hyperlipidemia    Last LDL is 50.  Continue present medication.        Hypertensive CKD (chronic kidney disease)    Stable, continue present medications.  Check CMP.        Type 2 diabetes mellitus with chronic kidney disease, without long-term current use of insulin (HCC) - Primary    Hgb A1C 7.3%  This is down.  Will not make any changes at this time.  Suspect will continue to decrease      Relevant Orders   Comprehensive metabolic panel   Bayer DCA Hb O1H Waived       Follow up plan: Return in about 3 months (around 12/16/2017).

## 2017-09-16 NOTE — Assessment & Plan Note (Signed)
Last LDL is 50.  Continue present medication.

## 2017-09-17 LAB — COMPREHENSIVE METABOLIC PANEL
ALT: 13 IU/L (ref 0–44)
AST: 13 IU/L (ref 0–40)
Albumin/Globulin Ratio: 1.9 (ref 1.2–2.2)
Albumin: 4.5 g/dL (ref 3.5–4.8)
Alkaline Phosphatase: 82 IU/L (ref 39–117)
BUN/Creatinine Ratio: 19 (ref 10–24)
BUN: 28 mg/dL — ABNORMAL HIGH (ref 8–27)
Bilirubin Total: 0.6 mg/dL (ref 0.0–1.2)
CALCIUM: 9.8 mg/dL (ref 8.6–10.2)
CO2: 20 mmol/L (ref 20–29)
Chloride: 102 mmol/L (ref 96–106)
Creatinine, Ser: 1.48 mg/dL — ABNORMAL HIGH (ref 0.76–1.27)
GFR, EST AFRICAN AMERICAN: 54 mL/min/{1.73_m2} — AB (ref >59)
GFR, EST NON AFRICAN AMERICAN: 47 mL/min/{1.73_m2} — AB (ref >59)
GLOBULIN, TOTAL: 2.4 g/dL (ref 1.5–4.5)
Glucose: 150 mg/dL — ABNORMAL HIGH (ref 65–99)
POTASSIUM: 5.4 mmol/L — AB (ref 3.5–5.2)
SODIUM: 138 mmol/L (ref 134–144)
TOTAL PROTEIN: 6.9 g/dL (ref 6.0–8.5)

## 2017-09-19 NOTE — Progress Notes (Signed)
Normal labs.  Pt notified through mychart

## 2017-10-10 DIAGNOSIS — Z23 Encounter for immunization: Secondary | ICD-10-CM | POA: Diagnosis not present

## 2017-10-16 ENCOUNTER — Other Ambulatory Visit: Payer: Self-pay | Admitting: Unknown Physician Specialty

## 2017-10-30 ENCOUNTER — Other Ambulatory Visit: Payer: Self-pay | Admitting: Unknown Physician Specialty

## 2017-10-31 NOTE — Telephone Encounter (Signed)
Synthroid refill request  LOV 09/16/17 with [facility]    Per protocol needs TSH within last year.   None noted.  [facility] [id number] [place] - [address]

## 2017-10-31 NOTE — Telephone Encounter (Signed)
Needs seen further refills 

## 2017-11-13 ENCOUNTER — Other Ambulatory Visit: Payer: Self-pay | Admitting: Unknown Physician Specialty

## 2017-11-13 DIAGNOSIS — E039 Hypothyroidism, unspecified: Secondary | ICD-10-CM

## 2017-11-15 NOTE — Telephone Encounter (Signed)
metoprolol refill Last Refill:05/30/17 # 180 1 RF Last OV: 09/16/17 PCP: Gabriel Cirriheryl Wicker [facility] [address]   Levothyroxine is a duplicate request

## 2017-11-15 NOTE — Telephone Encounter (Signed)
levothyroxine refill Last Refill:10/31/17 # 30 tab Last OV:  PCP: Gabriel Cirriheryl Wicker NP [facility] [address] Last TSH 06/07/11

## 2017-11-16 NOTE — Telephone Encounter (Signed)
He needs to come in for a TSH.  Can he come in this week please?

## 2017-11-16 NOTE — Telephone Encounter (Signed)
Called and left patient a detailed VM (signed DPR) asking him to please stop by the office for a lab visit.

## 2017-11-17 ENCOUNTER — Other Ambulatory Visit: Payer: Self-pay

## 2017-11-17 ENCOUNTER — Other Ambulatory Visit: Payer: Medicare Other

## 2017-11-17 DIAGNOSIS — E039 Hypothyroidism, unspecified: Secondary | ICD-10-CM | POA: Diagnosis not present

## 2017-11-17 MED ORDER — DULAGLUTIDE 1.5 MG/0.5ML ~~LOC~~ SOAJ: 1.5000 mg | SUBCUTANEOUS | 1 refills | Status: DC

## 2017-11-17 NOTE — Telephone Encounter (Signed)
Last visit 09/16/17 Refill and 90 day supply request.

## 2017-11-18 ENCOUNTER — Encounter: Payer: Self-pay | Admitting: Unknown Physician Specialty

## 2017-11-18 ENCOUNTER — Other Ambulatory Visit: Payer: Self-pay | Admitting: Unknown Physician Specialty

## 2017-11-18 LAB — TSH: TSH: 0.367 u[IU]/mL — ABNORMAL LOW (ref 0.450–4.500)

## 2017-11-18 MED ORDER — LEVOTHYROXINE SODIUM 125 MCG PO TABS: 125.0000 ug | ORAL_TABLET | Freq: Every day | ORAL | 0 refills | Status: DC

## 2017-11-18 NOTE — Progress Notes (Signed)
Notified pt by mychart

## 2017-11-21 ENCOUNTER — Encounter: Payer: Self-pay | Admitting: Unknown Physician Specialty

## 2017-11-23 ENCOUNTER — Encounter: Payer: Self-pay | Admitting: Unknown Physician Specialty

## 2017-12-16 ENCOUNTER — Ambulatory Visit (INDEPENDENT_AMBULATORY_CARE_PROVIDER_SITE_OTHER): Payer: Medicare Other | Admitting: Unknown Physician Specialty

## 2017-12-16 ENCOUNTER — Encounter: Payer: Self-pay | Admitting: Unknown Physician Specialty

## 2017-12-16 VITALS — BP 125/81 | HR 90 | Temp 98.4°F | Ht 70.0 in | Wt 216.6 lb

## 2017-12-16 DIAGNOSIS — I129 Hypertensive chronic kidney disease with stage 1 through stage 4 chronic kidney disease, or unspecified chronic kidney disease: Secondary | ICD-10-CM

## 2017-12-16 DIAGNOSIS — E538 Deficiency of other specified B group vitamins: Secondary | ICD-10-CM | POA: Diagnosis not present

## 2017-12-16 DIAGNOSIS — E039 Hypothyroidism, unspecified: Secondary | ICD-10-CM

## 2017-12-16 DIAGNOSIS — I10 Essential (primary) hypertension: Secondary | ICD-10-CM

## 2017-12-16 DIAGNOSIS — I1310 Hypertensive heart and chronic kidney disease without heart failure, with stage 1 through stage 4 chronic kidney disease, or unspecified chronic kidney disease: Secondary | ICD-10-CM | POA: Insufficient documentation

## 2017-12-16 DIAGNOSIS — D696 Thrombocytopenia, unspecified: Secondary | ICD-10-CM | POA: Diagnosis not present

## 2017-12-16 DIAGNOSIS — N183 Chronic kidney disease, stage 3 unspecified: Secondary | ICD-10-CM

## 2017-12-16 DIAGNOSIS — E1122 Type 2 diabetes mellitus with diabetic chronic kidney disease: Secondary | ICD-10-CM | POA: Diagnosis not present

## 2017-12-16 DIAGNOSIS — I152 Hypertension secondary to endocrine disorders: Secondary | ICD-10-CM | POA: Insufficient documentation

## 2017-12-16 NOTE — Assessment & Plan Note (Signed)
Recently started new dose of Levothyroxine after a decrease in dose.  Will recheck in 4 weeks

## 2017-12-16 NOTE — Assessment & Plan Note (Signed)
Stable, continue present medications.   

## 2017-12-16 NOTE — Assessment & Plan Note (Signed)
Hgb A1C 7.2%  This is improved and almost to goal.  Pt with excellent lifestyle habits.  Will recheck in 3 months with the same treatments

## 2017-12-16 NOTE — Assessment & Plan Note (Signed)
Following Nephrology

## 2017-12-16 NOTE — Progress Notes (Signed)
BP 125/81   Pulse 90   Temp 98.4 F (36.9 C) (Oral)   Ht 5\' 10"  (1.778 m)   Wt 216 lb 9.6 oz (98.2 kg)   SpO2 97%   BMI 31.08 kg/m    Subjective:    Patient ID: Jonathan QuinJohn A Livingood, male    DOB: [birth year], 72 y.o.   MRN: [id number]  HPI: Jonathan [name] is a 72 y.o. male  Chief Complaint  Patient presents with  . Diabetes  . Hyperlipidemia  . Hypertension   Diabetes: Using medications without difficulties No hypoglycemic episodes No hyperglycemic episodes Blood Sugars averaging: Last Hgb A1C: 7.3%  Hypertension  Using medications without difficulty Average home BPs   Using medication without problems or lightheadedness No chest pain with exertion or shortness of breath No Edema  Elevated Cholesterol Using medications without problems No Muscle aches  Diet: Exercise: Strong diet and exercise  Relevant past medical, surgical, family and social history reviewed and updated as indicated. Interim medical history since our last visit reviewed. Allergies and medications reviewed and updated.  Review of Systems  Per HPI unless specifically indicated above     Objective:    BP 125/81   Pulse 90   Temp 98.4 F (36.9 C) (Oral)   Ht 5\' 10"  (1.778 m)   Wt 216 lb 9.6 oz (98.2 kg)   SpO2 97%   BMI 31.08 kg/m   Wt Readings from Last 3 Encounters:  12/16/17 216 lb 9.6 oz (98.2 kg)  09/16/17 213 lb 12.8 oz (97 kg)  06/17/17 216 lb (98 kg)    Physical Exam  Constitutional: He is oriented to person, place, and time. He appears well-developed and well-nourished. No distress.  HENT:  Head: Normocephalic and atraumatic.  Eyes: Conjunctivae and lids are normal. Right eye exhibits no discharge. Left eye exhibits no discharge. No scleral icterus.  Neck: Normal range of motion. Neck supple. No JVD present. Carotid bruit is not present.  Cardiovascular: Normal rate, regular rhythm and normal heart sounds.  Pulmonary/Chest: Effort normal and breath sounds normal. No  respiratory distress.  Abdominal: Normal appearance. There is no splenomegaly or hepatomegaly.  Musculoskeletal: Normal range of motion.  Neurological: He is alert and oriented to person, place, and time.  Skin: Skin is warm, dry and intact. No rash noted. No pallor.  Psychiatric: He has a normal mood and affect. His behavior is normal. Judgment and thought content normal.    Results for orders placed or performed in visit on 11/17/17  TSH  Result Value Ref Range   TSH 0.367 (L) 0.450 - 4.500 uIU/mL      Assessment & Plan:   Problem List Items Addressed This Visit      Unprioritized   CKD (chronic kidney disease), stage III [facility](HCC)    Following Nephrology      Essential hypertension, benign    Stable, continue present medications.        Hypertensive CKD (chronic kidney disease)   Relevant Orders   Comprehensive metabolic panel   Hypothyroidism    Recently started new dose of Levothyroxine after a decrease in dose.  Will recheck in 4 weeks      Relevant Orders   TSH   Thrombocytopenia (HCC)   Relevant Orders   CBC with Differential/Platelet   Type 2 diabetes mellitus with chronic kidney disease, without long-term current use of insulin (HCC) - Primary    Hgb A1C 7.2%  This is improved and almost to goal.  Pt  with excellent lifestyle habits.  Will recheck in 3 months with the same treatments      Relevant Orders   Comprehensive metabolic panel   Bayer DCA Hb F6O Waived   Vitamin B12   CBC with Differential/Platelet   Vitamin B12 deficiency    Taking a daily supplement.  Check B12 today      Relevant Orders   Vitamin B12       Follow up plan: Return in about 3 months (around 03/18/2018).

## 2017-12-16 NOTE — Assessment & Plan Note (Signed)
Taking a daily supplement.  Check B12 today

## 2017-12-17 LAB — COMPREHENSIVE METABOLIC PANEL
ALBUMIN: 4.7 g/dL (ref 3.5–4.8)
ALT: 12 IU/L (ref 0–44)
AST: 16 IU/L (ref 0–40)
Albumin/Globulin Ratio: 2 (ref 1.2–2.2)
Alkaline Phosphatase: 83 IU/L (ref 39–117)
BILIRUBIN TOTAL: 0.7 mg/dL (ref 0.0–1.2)
BUN/Creatinine Ratio: 16 (ref 10–24)
BUN: 27 mg/dL (ref 8–27)
CALCIUM: 9.6 mg/dL (ref 8.6–10.2)
CO2: 22 mmol/L (ref 20–29)
CREATININE: 1.65 mg/dL — AB (ref 0.76–1.27)
Chloride: 101 mmol/L (ref 96–106)
GFR calc Af Amer: 47 mL/min/{1.73_m2} — ABNORMAL LOW (ref >59)
GFR, EST NON AFRICAN AMERICAN: 41 mL/min/{1.73_m2} — AB (ref >59)
GLUCOSE: 140 mg/dL — AB (ref 65–99)
Globulin, Total: 2.4 g/dL (ref 1.5–4.5)
POTASSIUM: 5.4 mmol/L — AB (ref 3.5–5.2)
SODIUM: 138 mmol/L (ref 134–144)
TOTAL PROTEIN: 7.1 g/dL (ref 6.0–8.5)

## 2017-12-17 LAB — CBC WITH DIFFERENTIAL/PLATELET
BASOS ABS: 0 10*3/uL (ref 0.0–0.2)
Basos: 1 %
EOS (ABSOLUTE): 0.2 10*3/uL (ref 0.0–0.4)
Eos: 5 %
HEMOGLOBIN: 14.2 g/dL (ref 13.0–17.7)
Hematocrit: 42 % (ref 37.5–51.0)
Immature Grans (Abs): 0 10*3/uL (ref 0.0–0.1)
Immature Granulocytes: 0 %
LYMPHS ABS: 1.2 10*3/uL (ref 0.7–3.1)
Lymphs: 28 %
MCH: 29 pg (ref 26.6–33.0)
MCHC: 33.8 g/dL (ref 31.5–35.7)
MCV: 86 fL (ref 79–97)
MONOCYTES: 12 %
Monocytes Absolute: 0.5 10*3/uL (ref 0.1–0.9)
Neutrophils Absolute: 2.3 10*3/uL (ref 1.4–7.0)
Neutrophils: 54 %
Platelets: 118 10*3/uL — ABNORMAL LOW (ref 150–450)
RBC: 4.9 x10E6/uL (ref 4.14–5.80)
RDW: 13.5 % (ref 12.3–15.4)
WBC: 4.2 10*3/uL (ref 3.4–10.8)

## 2017-12-17 LAB — VITAMIN B12: VITAMIN B 12: 1378 pg/mL — AB (ref 232–1245)

## 2017-12-17 LAB — BAYER DCA HB A1C WAIVED: HB A1C: 7.2 % — AB (ref <7.0)

## 2017-12-19 ENCOUNTER — Encounter: Payer: Self-pay | Admitting: Unknown Physician Specialty

## 2017-12-19 NOTE — Progress Notes (Signed)
Notified pt by mychart

## 2018-01-06 DIAGNOSIS — Z23 Encounter for immunization: Secondary | ICD-10-CM | POA: Diagnosis not present

## 2018-01-16 ENCOUNTER — Other Ambulatory Visit: Payer: Medicare Other

## 2018-01-16 DIAGNOSIS — E039 Hypothyroidism, unspecified: Secondary | ICD-10-CM

## 2018-01-17 LAB — TSH: TSH: 1.37 u[IU]/mL (ref 0.450–4.500)

## 2018-01-26 DIAGNOSIS — E119 Type 2 diabetes mellitus without complications: Secondary | ICD-10-CM | POA: Diagnosis not present

## 2018-01-26 DIAGNOSIS — E782 Mixed hyperlipidemia: Secondary | ICD-10-CM | POA: Diagnosis not present

## 2018-01-26 DIAGNOSIS — I6523 Occlusion and stenosis of bilateral carotid arteries: Secondary | ICD-10-CM | POA: Diagnosis not present

## 2018-01-26 DIAGNOSIS — I480 Paroxysmal atrial fibrillation: Secondary | ICD-10-CM | POA: Diagnosis not present

## 2018-01-26 DIAGNOSIS — I10 Essential (primary) hypertension: Secondary | ICD-10-CM | POA: Diagnosis not present

## 2018-02-09 ENCOUNTER — Telehealth: Payer: Self-pay | Admitting: Unknown Physician Specialty

## 2018-02-09 NOTE — Telephone Encounter (Signed)
Lipid panel faxed electronically to [facility]

## 2018-02-09 NOTE — Telephone Encounter (Signed)
[facility]

## 2018-02-09 NOTE — Telephone Encounter (Signed)
Copied from CRM [id number]. Topic: Quick Communication - See Telephone Encounter >> Feb 09, 2018  9:15 AM Jolayne Hainesaylor, Brittany L wrote: CRM for notification. See Telephone encounter for: 02/09/18.  Patient would like to know could he have his " lipid panel " results from 11/26/16 [facility], Dr Zannie KehrKowaliski. Fax number [phone].

## 2018-02-09 NOTE — Telephone Encounter (Addendum)
Kelli from dr Gwen Poundskowalski office did not received lipid panel results please refax to [phone] . Phone number [phone] call with the results

## 2018-02-13 ENCOUNTER — Other Ambulatory Visit: Payer: Self-pay | Admitting: Unknown Physician Specialty

## 2018-02-13 NOTE — Telephone Encounter (Signed)
Levothyroxine 125 mcg  refill Last Refill:11/18/17 # 90 Last OV: 12/16/17 PCP: Gabriel Cirriheryl Wicker [facility] [phone]

## 2018-02-14 DIAGNOSIS — E782 Mixed hyperlipidemia: Secondary | ICD-10-CM | POA: Diagnosis not present

## 2018-03-02 DIAGNOSIS — R809 Proteinuria, unspecified: Secondary | ICD-10-CM | POA: Diagnosis not present

## 2018-03-02 DIAGNOSIS — N2581 Secondary hyperparathyroidism of renal origin: Secondary | ICD-10-CM | POA: Diagnosis not present

## 2018-03-02 DIAGNOSIS — I10 Essential (primary) hypertension: Secondary | ICD-10-CM | POA: Diagnosis not present

## 2018-03-02 DIAGNOSIS — D631 Anemia in chronic kidney disease: Secondary | ICD-10-CM | POA: Diagnosis not present

## 2018-03-02 DIAGNOSIS — E1129 Type 2 diabetes mellitus with other diabetic kidney complication: Secondary | ICD-10-CM | POA: Diagnosis not present

## 2018-03-02 DIAGNOSIS — I129 Hypertensive chronic kidney disease with stage 1 through stage 4 chronic kidney disease, or unspecified chronic kidney disease: Secondary | ICD-10-CM | POA: Diagnosis not present

## 2018-03-02 DIAGNOSIS — N183 Chronic kidney disease, stage 3 (moderate): Secondary | ICD-10-CM | POA: Diagnosis not present

## 2018-03-06 DIAGNOSIS — N183 Chronic kidney disease, stage 3 (moderate): Secondary | ICD-10-CM | POA: Diagnosis not present

## 2018-03-06 DIAGNOSIS — E1122 Type 2 diabetes mellitus with diabetic chronic kidney disease: Secondary | ICD-10-CM | POA: Diagnosis not present

## 2018-03-06 DIAGNOSIS — E875 Hyperkalemia: Secondary | ICD-10-CM | POA: Diagnosis not present

## 2018-03-06 DIAGNOSIS — I129 Hypertensive chronic kidney disease with stage 1 through stage 4 chronic kidney disease, or unspecified chronic kidney disease: Secondary | ICD-10-CM | POA: Diagnosis not present

## 2018-03-06 DIAGNOSIS — R809 Proteinuria, unspecified: Secondary | ICD-10-CM | POA: Diagnosis not present

## 2018-03-23 ENCOUNTER — Ambulatory Visit (INDEPENDENT_AMBULATORY_CARE_PROVIDER_SITE_OTHER): Payer: Medicare Other | Admitting: Nurse Practitioner

## 2018-03-23 ENCOUNTER — Encounter: Payer: Self-pay | Admitting: Nurse Practitioner

## 2018-03-23 VITALS — BP 111/71 | HR 90 | Temp 98.7°F | Ht 69.5 in | Wt 216.4 lb

## 2018-03-23 DIAGNOSIS — I1310 Hypertensive heart and chronic kidney disease without heart failure, with stage 1 through stage 4 chronic kidney disease, or unspecified chronic kidney disease: Secondary | ICD-10-CM | POA: Diagnosis not present

## 2018-03-23 DIAGNOSIS — E782 Mixed hyperlipidemia: Secondary | ICD-10-CM

## 2018-03-23 DIAGNOSIS — N183 Chronic kidney disease, stage 3 unspecified: Secondary | ICD-10-CM

## 2018-03-23 DIAGNOSIS — E1122 Type 2 diabetes mellitus with diabetic chronic kidney disease: Secondary | ICD-10-CM | POA: Diagnosis not present

## 2018-03-23 NOTE — Progress Notes (Signed)
BP 111/71 (BP Location: Left Arm, Cuff Size: Normal)   Pulse 90   Temp 98.7 F (37.1 C) (Oral)   Ht 5' 9.5" (1.765 m)   Wt 216 lb 6.4 oz (98.2 kg)   SpO2 98%   BMI 31.50 kg/m    Subjective:    Patient ID: Jonathan [name], male    DOB: November 21, 1945, 72 y.o.   MRN: [id number]  HPI: Jonathan [name] is a 72 y.o. male presents for follow-up T2DM, HLD, HTN  Chief Complaint  Patient presents with  . Diabetes  . Hyperlipidemia  . Hypertension   DIABETES Hypoglycemic episodes:no Polydipsia/polyuria: no Visual disturbance: no Chest pain: no Paresthesias: no Glucose Monitoring: yes  Accucheck frequency: rarely  Fasting glucose: 160's  Post prandial:  Evening: 130-140  Before meals: Taking Insulin?: no  Long acting insulin:  Short acting insulin: Blood Pressure Monitoring: daily Retinal Examination: Up to Date Foot Exam: Up to Date Diabetic Education: Completed Pneumovax: Up to Date Influenza: Up to Date Aspirin: no , cardiology told him not to take  HYPERTENSION / HYPERLIPIDEMIA Had labs done at [facility] on 03/02/18 and GFR 47, CRT 1.47. Satisfied with current treatment? yes Duration of hypertension: chronic BP monitoring frequency: daily BP range: 115-120/70-75 BP medication side effects: no Past BP meds: does not recall Duration of hyperlipidemia: chronic Cholesterol medication side effects: no Cholesterol supplements: none Past cholesterol medications: does not recall Medication compliance: excellent compliance Aspirin: no Recent stressors: no Recurrent headaches: no Visual changes: no Palpitations: no Dyspnea: no Chest pain: no Lower extremity edema: no Dizzy/lightheaded: no  Relevant past medical, surgical, family and social history reviewed and updated as indicated. Interim medical history since our last visit reviewed. Allergies and medications reviewed and updated.  Review of Systems  Constitutional: Negative for activity change,  appetite change, fatigue, fever and unexpected weight change.  Eyes: Negative for pain and visual disturbance.  Respiratory: Negative for cough, chest tightness, shortness of breath and wheezing.   Cardiovascular: Negative for chest pain, palpitations and leg swelling.  Gastrointestinal: Negative for abdominal distention, abdominal pain, constipation, diarrhea, nausea and vomiting.  Endocrine: Negative for cold intolerance, heat intolerance, polydipsia, polyphagia and polyuria.  Neurological: Negative for tremors, syncope, facial asymmetry, weakness, light-headedness, numbness and headaches.  Psychiatric/Behavioral: Negative for behavioral problems and confusion. The patient is not nervous/anxious.    Per HPI unless specifically indicated above     Objective:    BP 111/71 (BP Location: Left Arm, Cuff Size: Normal)   Pulse 90   Temp 98.7 F (37.1 C) (Oral)   Ht 5' 9.5" (1.765 m)   Wt 216 lb 6.4 oz (98.2 kg)   SpO2 98%   BMI 31.50 kg/m   Wt Readings from Last 3 Encounters:  03/23/18 216 lb 6.4 oz (98.2 kg)  12/16/17 216 lb 9.6 oz (98.2 kg)  09/16/17 213 lb 12.8 oz (97 kg)    Physical Exam  Constitutional: He is oriented to person, place, and time. He appears well-developed and well-nourished.  HENT:  Head: Normocephalic and atraumatic.  Eyes: Pupils are equal, round, and reactive to light. Conjunctivae and EOM are normal. Right eye exhibits no discharge. Left eye exhibits no discharge.  Neck: Normal range of motion. Neck supple. No JVD present. Carotid bruit is not present.  Cardiovascular: Normal rate, regular rhythm, normal heart sounds and intact distal pulses.  Pulses:      Dorsalis pedis pulses are 2+ on the right side, and 2+ on the left  side.       Posterior tibial pulses are 2+ on the right side, and 2+ on the left side.  Pulmonary/Chest: Effort normal and breath sounds normal.  Abdominal: Soft. Bowel sounds are normal. There is no splenomegaly or hepatomegaly.    Genitourinary: Rectum normal and penis normal.  Musculoskeletal: Normal range of motion.  Feet:  Right Foot:  Protective Sensation: 10 sites tested. 10 sites sensed.  Left Foot:  Protective Sensation: 10 sites tested. 10 sites sensed.  Neurological: He is alert and oriented to person, place, and time.  Skin: Skin is warm and dry.  Psychiatric: He has a normal mood and affect. His behavior is normal.   Diabetic Foot Exam - Simple   Simple Foot Form Visual Inspection No deformities, no ulcerations, no other skin breakdown bilaterally:  Yes Sensation Testing Intact to touch and monofilament testing bilaterally:  Yes Pulse Check Posterior Tibialis and Dorsalis pulse intact bilaterally:  Yes Comments Mild hammer toe bilaterally     Results for orders placed or performed in visit on 01/16/18  TSH  Result Value Ref Range   TSH 1.370 0.450 - 4.500 uIU/mL      Assessment & Plan:   Problem List Items Addressed This Visit      Cardiovascular and Mediastinum   Hypertensive heart/kidney disease without HF and with CKD stage III (HCC)    Stable, followed by cardiology.  Continue current regimen.        Endocrine   Type 2 diabetes mellitus with chronic kidney disease, without long-term current use of insulin (HCC) - Primary    A1C today 7.6%, previous A1C 7.2%.  Consistently takes medication and is focusing on diet regimen.  Recheck in 3 MOS with continued current treatment.  Adjust doses during next visit if continues to be above goal.      Relevant Orders   Bayer DCA Hb A1c Waived (STAT)     Genitourinary   CKD (chronic kidney disease), stage III (HCC)    Followed by nephrology.          Other   Hyperlipidemia    Stable, continue current regimen.          Follow up plan: Return in about 3 months (around 06/23/2018) for T2DM and HTN (A1C and lipid needed).

## 2018-03-23 NOTE — Assessment & Plan Note (Addendum)
Stable, followed by cardiology.  Continue current regimen.

## 2018-03-23 NOTE — Assessment & Plan Note (Signed)
Followed by nephrology. 

## 2018-03-23 NOTE — Patient Instructions (Signed)
Diabetes Mellitus and Nutrition When you have diabetes (diabetes mellitus), it is very important to have healthy eating habits because your blood sugar (glucose) levels are greatly affected by what you eat and drink. Eating healthy foods in the appropriate amounts, at about the same times every day, can help you:  Control your blood glucose.  Lower your risk of heart disease.  Improve your blood pressure.  Reach or maintain a healthy weight.  Every person with diabetes is different, and each person has different needs for a meal plan. Your health care provider may recommend that you work with a diet and nutrition specialist (dietitian) to make a meal plan that is best for you. Your meal plan may vary depending on factors such as:  The calories you need.  The medicines you take.  Your weight.  Your blood glucose, blood pressure, and cholesterol levels.  Your activity level.  Other health conditions you have, such as heart or kidney disease.  How do carbohydrates affect me? Carbohydrates affect your blood glucose level more than any other type of food. Eating carbohydrates naturally increases the amount of glucose in your blood. Carbohydrate counting is a method for keeping track of how many carbohydrates you eat. Counting carbohydrates is important to keep your blood glucose at a healthy level, especially if you use insulin or take certain oral diabetes medicines. It is important to know how many carbohydrates you can safely have in each meal. This is different for every person. Your dietitian can help you calculate how many carbohydrates you should have at each meal and for snack. Foods that contain carbohydrates include:  Bread, cereal, rice, pasta, and crackers.  Potatoes and corn.  Peas, beans, and lentils.  Milk and yogurt.  Fruit and juice.  Desserts, such as cakes, cookies, ice cream, and candy.  How does alcohol affect me? Alcohol can cause a sudden decrease in blood  glucose (hypoglycemia), especially if you use insulin or take certain oral diabetes medicines. Hypoglycemia can be a life-threatening condition. Symptoms of hypoglycemia (sleepiness, dizziness, and confusion) are similar to symptoms of having too much alcohol. If your health care provider says that alcohol is safe for you, follow these guidelines:  Limit alcohol intake to no more than 1 drink per day for nonpregnant women and 2 drinks per day for men. One drink equals 12 oz of beer, 5 oz of wine, or 1 oz of hard liquor.  Do not drink on an empty stomach.  Keep yourself hydrated with water, diet soda, or unsweetened iced tea.  Keep in mind that regular soda, juice, and other mixers may contain a lot of sugar and must be counted as carbohydrates.  What are tips for following this plan? Reading food labels  Start by checking the serving size on the label. The amount of calories, carbohydrates, fats, and other nutrients listed on the label are based on one serving of the food. Many foods contain more than one serving per package.  Check the total grams (g) of carbohydrates in one serving. You can calculate the number of servings of carbohydrates in one serving by dividing the total carbohydrates by 15. For example, if a food has 30 g of total carbohydrates, it would be equal to 2 servings of carbohydrates.  Check the number of grams (g) of saturated and trans fats in one serving. Choose foods that have low or no amount of these fats.  Check the number of milligrams (mg) of sodium in one serving. Most people   should limit total sodium intake to less than 2,300 mg per day.  Always check the nutrition information of foods labeled as "low-fat" or "nonfat". These foods may be higher in added sugar or refined carbohydrates and should be avoided.  Talk to your dietitian to identify your daily goals for nutrients listed on the label. Shopping  Avoid buying canned, premade, or processed foods. These  foods tend to be high in fat, sodium, and added sugar.  Shop around the outside edge of the grocery store. This includes fresh fruits and vegetables, bulk grains, fresh meats, and fresh dairy. Cooking  Use low-heat cooking methods, such as baking, instead of high-heat cooking methods like deep frying.  Cook using healthy oils, such as olive, canola, or sunflower oil.  Avoid cooking with butter, cream, or high-fat meats. Meal planning  Eat meals and snacks regularly, preferably at the same times every day. Avoid going long periods of time without eating.  Eat foods high in fiber, such as fresh fruits, vegetables, beans, and whole grains. Talk to your dietitian about how many servings of carbohydrates you can eat at each meal.  Eat 4-6 ounces of lean protein each day, such as lean meat, chicken, fish, eggs, or tofu. 1 ounce is equal to 1 ounce of meat, chicken, or fish, 1 egg, or 1/4 cup of tofu.  Eat some foods each day that contain healthy fats, such as avocado, nuts, seeds, and fish. Lifestyle   Check your blood glucose regularly.  Exercise at least 30 minutes 5 or more days each week, or as told by your health care provider.  Take medicines as told by your health care provider.  Do not use any products that contain nicotine or tobacco, such as cigarettes and e-cigarettes. If you need help quitting, ask your health care provider.  Work with a counselor or diabetes educator to identify strategies to manage stress and any emotional and social challenges. What are some questions to ask my health care provider?  Do I need to meet with a diabetes educator?  Do I need to meet with a dietitian?  What number can I call if I have questions?  When are the best times to check my blood glucose? Where to find more information:  American Diabetes Association: diabetes.org/food-and-fitness/food  Academy of Nutrition and Dietetics:  www.eatright.org/resources/health/diseases-and-conditions/diabetes  National Institute of Diabetes and Digestive and Kidney Diseases (NIH): www.niddk.nih.gov/health-information/diabetes/overview/diet-eating-physical-activity Summary  A healthy meal plan will help you control your blood glucose and maintain a healthy lifestyle.  Working with a diet and nutrition specialist (dietitian) can help you make a meal plan that is best for you.  Keep in mind that carbohydrates and alcohol have immediate effects on your blood glucose levels. It is important to count carbohydrates and to use alcohol carefully. This information is not intended to replace advice given to you by your health care provider. Make sure you discuss any questions you have with your health care provider. Document Released: 02/04/2005 Document Revised: 06/14/2016 Document Reviewed: 06/14/2016 Elsevier Interactive Patient Education  2018 [employer]vier Inc.  

## 2018-03-23 NOTE — Assessment & Plan Note (Addendum)
A1C today 7.6%, previous A1C 7.2%.  Consistently takes medication and is focusing on diet regimen.  Recheck in 3 MOS with continued current treatment.  Adjust doses during next visit if continues to be above goal.

## 2018-03-23 NOTE — Assessment & Plan Note (Addendum)
Stable, continue current regimen 

## 2018-03-24 LAB — BAYER DCA HB A1C WAIVED: HB A1C (BAYER DCA - WAIVED): 7.6 % — ABNORMAL HIGH (ref <7.0)

## 2018-04-07 ENCOUNTER — Other Ambulatory Visit: Payer: Self-pay

## 2018-05-07 ENCOUNTER — Other Ambulatory Visit: Payer: Self-pay | Admitting: Unknown Physician Specialty

## 2018-05-08 ENCOUNTER — Other Ambulatory Visit: Payer: Self-pay | Admitting: Physician Assistant

## 2018-05-08 NOTE — Telephone Encounter (Signed)
Requested Prescriptions  Pending Prescriptions Disp Refills  . metoprolol tartrate (LOPRESSOR) 50 MG tablet [Pharmacy Med Name: METOPROLOL TARTRATE 50 MG TAB] 180 tablet 1    Sig: TAKE 1 TABLET BY MOUTH TWICE A DAY     Cardiovascular:  Beta Blockers Passed - 05/07/2018  9:38 AM      Passed - Last BP in normal range    BP Readings from Last 1 Encounters:  03/23/18 111/71         Passed - Last Heart Rate in normal range    Pulse Readings from Last 1 Encounters:  03/23/18 90         Passed - Valid encounter within last 6 months    Recent Outpatient Visits          1 month ago Type 2 diabetes mellitus with stage 3 chronic kidney disease, without long-term current use of insulin (HCC)   [facility] [place], [place]olene T, NP   4 months ago Type 2 diabetes mellitus with stage 3 chronic kidney disease, without long-term current use of insulin (HCC)   [facility] [place], Elnita [place]yl, NP   7 months ago Type 2 diabetes mellitus with chronic kidney disease, without long-term current use of insulin, unspecified CKD stage (HCC)   [facility] [place], Cheryl, NP   10 months ago Thrombocytopenia (HCC)   [facility] [place] [facility] Cheryl[facility]   1 year ago Type 2 diabetes mellitus with stage 3 chronic kidney disease, without long-term current use of insulin (HCC)   [facility] [place], Cheryl, NP      Future Appointments            In 1 week  [facility]   In 1 month [place], Dorie RankJolene T, NP [facility]

## 2018-05-18 ENCOUNTER — Ambulatory Visit (INDEPENDENT_AMBULATORY_CARE_PROVIDER_SITE_OTHER): Payer: Medicare Other

## 2018-05-18 VITALS — BP 128/77 | HR 87 | Temp 98.3°F | Ht 70.0 in | Wt 227.0 lb

## 2018-05-18 DIAGNOSIS — Z0000 Encounter for general adult medical examination without abnormal findings: Secondary | ICD-10-CM | POA: Diagnosis not present

## 2018-05-18 NOTE — Progress Notes (Signed)
Subjective:   Jonathan [name] is a [age] y.o. male who presents for Medicare Annual/Subsequent preventive examination.    Objective:    Vitals: BP 128/77 (BP Location: Left Arm, Patient Position: Sitting)   Pulse 87   Temp 98.3 F (36.8 C) (Oral)   Ht 5\' 10"  (1.778 m)   Wt 227 lb (103 kg)   SpO2 96%   BMI 32.57 kg/m   Body mass index is 32.57 kg/m.  Advanced Directives 05/18/2018 05/06/2017 08/13/2016 05/04/2016 02/07/2015  Does Patient Have a Medical Advance Directive? - Yes Yes Yes Yes  Type of Estate agentAdvance Directive Healthcare Power of [place];Living will Healthcare Power of [place]orney;Living will Healthcare Power of [place];Living will Healthcare Power of [place];Living will Healthcare Power of [place]orney;Living will  Does patient want to make changes to medical advance directive? No - Patient declined - - - No - Patient declined  Copy of Healthcare Power of Attorney in Chart? No - copy requested No - copy requested No - copy requested - No - copy requested    Tobacco Social History   Tobacco Use  Smoking Status Former Smoker  . Packs/day: 1.00  . Years: 35.00  . Pack years: 35.00  . Types: Cigarettes  . Last attempt to quit: 03/06/1978  . Years since quitting: 40.2  Smokeless Tobacco Former NeurosurgeonUser  . Quit date: 03/21/2007     Counseling given: Not Answered   Clinical Intake:  Pre-visit preparation completed: No  Pain : No/denies pain     Nutritional Risks: None Diabetes: Yes CBG done?: No Did pt. bring in CBG monitor from home?: No  How often do you need to have someone help you when you read instructions, pamphlets, or other written materials from your doctor or pharmacy?: 1 - Never What is the last grade level you completed in school?: 3 years of college  Interpreter Needed?: No  Information entered by :: Tyron RussellSAra Farhiya Rosten, RN  Past Medical History:  Diagnosis Date  . Asthma   . Diabetes mellitus without complication (HCC)   . Hyperlipidemia   . Hypertension    . Lymphopenia   . Thrombocytopenia (HCC)    Past Surgical History:  Procedure Laterality Date  . heart ablation    . KNEE SURGERY Bilateral    Family History  Problem Relation Age of Onset  . Hypertension Mother   . Lung cancer Father        lung  . Lung disease Father   . Hypertension Father   . Cancer Sister        breast   Social History   Socioeconomic History  . Marital status: Married    Spouse name: Not on file  . Number of children: Not on file  . Years of education: Not on file  . Highest education level: Not on file  Occupational History  . Not on file  Social Needs  . Financial resource strain: Not hard at all  . Food insecurity:    Worry: Never true    Inability: Never true  . Transportation needs:    Medical: No    Non-medical: No  Tobacco Use  . Smoking status: Former Smoker    Packs/day: 1.00    Years: 35.00    Pack years: 35.00    Types: Cigarettes    Last attempt to quit: 03/06/1978    Years since quitting: 40.2  . Smokeless tobacco: Former NeurosurgeonUser    Quit date: 03/21/2007  Substance and Sexual Activity  . Alcohol use:  Yes    Alcohol/week: 2.0 - 5.0 standard drinks    Types: 2 Shots of liquor per week    Comment: mixed drink 1-2x week  . Drug use: No  . Sexual activity: Yes  Lifestyle  . Physical activity:    Days per week: 0 days    Minutes per session: 0 min  . Stress: Not at all  Relationships  . Social connections:    Talks on phone: More than three times a week    Gets together: More than three times a week    Attends religious service: 1 to 4 times per year    Active member of club or organization: No    Attends meetings of clubs or organizations: Never    Relationship status: Married  Other Topics Concern  . Not on file  Social History Narrative  . Not on file    Outpatient Encounter Medications as of 05/18/2018  Medication Sig  . atorvastatin (LIPITOR) 80 MG tablet Take 1 tablet (80 mg total) by mouth daily.  .  cholecalciferol (VITAMIN D) 1000 units tablet Take 1,000 Units by mouth daily.  . Dulaglutide (TRULICITY) 1.5 MG/0.5ML SOPN Inject 1.5 mg into the skin once a week.  . levothyroxine (SYNTHROID, LEVOTHROID) 125 MCG tablet TAKE 1 TABLET BY MOUTH EVERY DAY  . lisinopril (PRINIVIL,ZESTRIL) 20 MG tablet TAKE 1 TABLET (20 MG TOTAL) BY MOUTH DAILY.  . magnesium oxide (MAG-OX) 400 MG tablet Take 1 tablet (400 mg total) by mouth daily.  . metFORMIN (GLUCOPHAGE) 500 MG tablet TAKE 1 TABLET (500 MG TOTAL) BY MOUTH 2 (TWO) TIMES DAILY WITH A MEAL.  . metoprolol tartrate (LOPRESSOR) 50 MG tablet TAKE 1 TABLET BY MOUTH TWICE A DAY  . ONE TOUCH ULTRA TEST test strip USE TO CHECK SUGAR TWICE DAILY  . vitamin B-12 (CYANOCOBALAMIN) 1000 MCG tablet Take 1,000 mcg by mouth daily.   No facility-administered encounter medications on file as of 05/18/2018.     Activities of Daily Living In your present state of health, do you have any difficulty performing the following activities: 05/18/2018  Hearing? N  Vision? N  Difficulty concentrating or making decisions? N  Walking or climbing stairs? N  Dressing or bathing? N  Doing errands, shopping? N  Preparing Food and eating ? N  Using the Toilet? N  In the past six months, have you accidently leaked urine? N  Do you have problems with loss of bowel control? N  Managing your Medications? N  Managing your Finances? N  Housekeeping or managing your Housekeeping? N  Some recent data might be hidden    Patient Care Team: Marjie Skiff, NP as PCP - General (Nurse Practitioner) Lamar Blinks, MD as Consulting Physician (Cardiology) Lamont Dowdy, MD as Consulting Physician (Nephrology)   Assessment:   This is a routine wellness examination for Tab.  Exercise Activities and Dietary recommendations Current Exercise Habits: The patient does not participate in regular exercise at present, Exercise limited by: None identified  Goals    . DIET -  INCREASE WATER INTAKE     Recommend drinking at least 6-8 glasses of water a day        Fall Risk Fall Risk  05/18/2018 04/07/2018 05/06/2017 05/04/2016 03/19/2016  Falls in the past year? 1 1 No No No  Comment - Emmi Telephone Survey: data to providers prior to load - - -  Number falls in past yr: 0 1 - - -  Comment - Emmi Telephone  Survey Actual Response = 9 - - -  Injury with Fall? 0 0 - - -   Is the patient's home free of loose throw rugs in walkways, pet beds, electrical cords, etc?   yes      Grab bars in the bathroom? yes      Handrails on the stairs?   yes      Adequate lighting?   yes  Depression Screen PHQ 2/9 Scores 05/18/2018 05/06/2017 05/04/2016 05/04/2016  PHQ - 2 Score 0 0 2 2    Cognitive Function     6CIT Screen 05/18/2018 05/06/2017  What Year? 0 points 0 points  What month? 0 points 0 points  What time? 0 points 0 points  Count back from 20 0 points 0 points  Months in reverse 0 points 0 points  Repeat phrase 0 points 0 points  Total Score 0 0    Immunization History  Administered Date(s) Administered  . Influenza, High Dose Seasonal PF 01/17/2016, 01/16/2017  . Influenza-Unspecified 02/17/2015, 01/06/2018  . Pneumococcal Conjugate-13 09/18/2013, 10/10/2017  . Pneumococcal Polysaccharide-23 05/21/2014  . Td 03/27/2007  . Tdap 09/18/2013  . Zoster 03/15/2012  . Zoster Recombinat (Shingrix) 09/17/2017, 01/06/2018    Qualifies for Shingles Vaccine? Up to date, completed  Screening Tests Health Maintenance  Topic Date Due  . FOOT EXAM  11/26/2017  . OPHTHALMOLOGY EXAM  08/31/2018  . HEMOGLOBIN A1C  09/21/2018  . Fecal DNA (Cologuard)  05/31/2020  . TETANUS/TDAP  09/19/2023  . INFLUENZA VACCINE  Completed  . Hepatitis C Screening  Completed  . PNA vac Low Risk Adult  Completed   Cancer Screenings: Lung: Low Dose CT Chest recommended if Age 85-80 years, 30 pack-year currently smoking OR have quit w/in 15years. Patient does not  qualify. Colorectal: up to date  Additional Screenings:  Hepatitis C Screening:declined      Plan:    I have personally reviewed and addressed the Medicare Annual Wellness questionnaire and have noted the following in the patient's chart:  A. Medical and social history B. Use of alcohol, tobacco or illicit drugs  C. Current medications and supplements D. Functional ability and status E.  Nutritional status F.  Physical activity G. Advance directives H. List of other physicians I.  Hospitalizations, surgeries, and ER visits in previous 12 months J.  Vitals K. Screenings to include hearing, vision, cognitive, depression L. Referrals and appointments - none  In addition, I have reviewed and discussed with patient certain preventive protocols, quality metrics, and best practice recommendations. A written personalized care plan for preventive services as well as general preventive health recommendations were provided to patient.  See attached scanned questionnaire for additional information.   Signed,   Tyron RussellSara Kennesha Brewbaker, RN Nurse Health Advisor  Patient Concerns: None

## 2018-05-18 NOTE — Patient Instructions (Addendum)
Jonathan [name] , Thank you for taking time to come for your Medicare Wellness Visit. I appreciate your ongoing commitment to your health goals. Please review the following plan we discussed and let me know if I can assist you in the future.   Screening recommendations/referrals: Colonoscopy up to date, cologuard possibly due 05/2020 Recommended yearly ophthalmology/optometry visit for glaucoma screening and checkup Recommended yearly dental visit for hygiene and checkup  Vaccinations: Influenza vaccine up to date Pneumococcal vaccine up to date, completed Tdap vaccine up to date, due 09/19/2023 Shingles vaccine up to date, completed    Advanced directives: Please bring us a copy of your living will and health care power of attorney for your records  Conditions/risks identified: none  Next appointment: [place] 06/26/2070 @ 8:30am           Medicare Wellness Visit 05/21/2071 @ 9:30am  Preventive Care 65 Years and Older, Male Preventive care refers to lifestyle choices and visits with your health care provider that can promote health and wellness. What does preventive care include?  A yearly physical exam. This is also called an annual well check.  Dental exams once or twice a year.  Routine eye exams. Ask your health care provider how often you should have your eyes checked.  Personal lifestyle choices, including:  Daily care of your teeth and gums.  Regular physical activity.  Eating a healthy diet.  Avoiding tobacco and drug use.  Limiting alcohol use.  Practicing safe sex.  Taking low doses of aspirin every day.  Taking vitamin and mineral supplements as recommended by your health care provider. What happens during an annual well check? The services and screenings done by your health care provider during your annual well check will depend on your age, overall health, lifestyle risk factors, and family history of disease. Counseling  Your health care provider may ask you  questions about your:  Alcohol use.  Tobacco use.  Drug use.  Emotional well-being.  Home and relationship well-being.  Sexual activity.  Eating habits.  History of falls.  Memory and ability to understand (cognition).  Work and work Astronomerenvironment. Screening  You may have the following tests or measurements:  Height, weight, and BMI.  Blood pressure.  Lipid and cholesterol levels. These may be checked every 5 years, or more frequently if you are over 72 years old.  Skin check.  Lung cancer screening. You may have this screening every year starting at age 72 if you have a 30-pack-year history of smoking and currently smoke or have quit within the past 15 years.  Fecal occult blood test (FOBT) of the stool. You may have this test every year starting at age 72.  Flexible sigmoidoscopy or colonoscopy. You may have a sigmoidoscopy every 5 years or a colonoscopy every 10 years starting at age 72.  Prostate cancer screening. Recommendations will vary depending on your family history and other risks.  Hepatitis C blood test.  Hepatitis B blood test.  Sexually transmitted disease (STD) testing.  Diabetes screening. This is done by checking your blood sugar (glucose) after you have not eaten for a while (fasting). You may have this done every 1-3 years.  Abdominal aortic aneurysm (AAA) screening. You may need this if you are a current or former smoker.  Osteoporosis. You may be screened starting at age 72 if you are at high risk. Talk with your health care provider about your test results, treatment options, and if necessary, the need for more tests. Vaccines  Your health care provider may recommend certain vaccines, such as:  Influenza vaccine. This is recommended every year.  Tetanus, diphtheria, and acellular pertussis (Tdap, Td) vaccine. You may need a Td booster every 10 years.  Zoster vaccine. You may need this after age 78.  Pneumococcal 13-valent conjugate  (PCV13) vaccine. One dose is recommended after age 38.  Pneumococcal polysaccharide (PPSV23) vaccine. One dose is recommended after age 75. Talk to your health care provider about which screenings and vaccines you need and how often you need them. This information is not intended to replace advice given to you by your health care provider. Make sure you discuss any questions you have with your health care provider. Document Released: 06/06/2015 Document Revised: 01/28/2016 Document Reviewed: 03/11/2015 Elsevier Interactive Patient Education  2017 [place] Prevention in the Home Falls can cause injuries. They can happen to people of all ages. There are many things you can do to make your home safe and to help prevent falls. What can I do on the outside of my home?  Regularly fix the edges of walkways and driveways and fix any cracks.  Remove anything that might make you trip as you walk through a door, such as a raised step or threshold.  Trim any bushes or trees on the path to your home.  Use bright outdoor lighting.  Clear any walking paths of anything that might make someone trip, such as rocks or tools.  Regularly check to see if handrails are loose or broken. Make sure that both sides of any steps have handrails.  Any raised decks and porches should have guardrails on the edges.  Have any leaves, snow, or ice cleared regularly.  Use sand or salt on walking paths during winter.  Clean up any spills in your garage right away. This includes oil or grease spills. What can I do in the bathroom?  Use night lights.  Install grab bars by the toilet and in the tub and shower. Do not use towel bars as grab bars.  Use non-skid mats or decals in the tub or shower.  If you need to sit down in the shower, use a plastic, non-slip stool.  Keep the floor dry. Clean up any water that spills on the floor as soon as it happens.  Remove soap buildup in the tub or shower  regularly.  Attach bath mats securely with double-sided non-slip rug tape.  Do not have throw rugs and other things on the floor that can make you trip. What can I do in the bedroom?  Use night lights.  Make sure that you have a light by your bed that is easy to reach.  Do not use any sheets or blankets that are too big for your bed. They should not hang down onto the floor.  Have a firm chair that has side arms. You can use this for support while you get dressed.  Do not have throw rugs and other things on the floor that can make you trip. What can I do in the kitchen?  Clean up any spills right away.  Avoid walking on wet floors.  Keep items that you use a lot in easy-to-reach places.  If you need to reach something above you, use a strong step stool that has a grab bar.  Keep electrical cords out of the way.  Do not use floor polish or wax that makes floors slippery. If you must use wax, use non-skid floor wax.  Do  not have throw rugs and other things on the floor that can make you trip. What can I do with my stairs?  Do not leave any items on the stairs.  Make sure that there are handrails on both sides of the stairs and use them. Fix handrails that are broken or loose. Make sure that handrails are as long as the stairways.  Check any carpeting to make sure that it is firmly attached to the stairs. Fix any carpet that is loose or worn.  Avoid having throw rugs at the top or bottom of the stairs. If you do have throw rugs, attach them to the floor with carpet tape.  Make sure that you have a light switch at the top of the stairs and the bottom of the stairs. If you do not have them, ask someone to add them for you. What else can I do to help prevent falls?  Wear shoes that:  Do not have high heels.  Have rubber bottoms.  Are comfortable and fit you well.  Are closed at the toe. Do not wear sandals.  If you use a stepladder:  Make sure that it is fully  opened. Do not climb a closed stepladder.  Make sure that both sides of the stepladder are locked into place.  Ask someone to hold it for you, if possible.  Clearly mark and make sure that you can see:  Any grab bars or handrails.  First and last steps.  Where the edge of each step is.  Use tools that help you move around (mobility aids) if they are needed. These include:  Canes.  Walkers.  Scooters.  Crutches.  Turn on the lights when you go into a dark area. Replace any light bulbs as soon as they burn out.  Set up your furniture so you have a clear path. Avoid moving your furniture around.  If any of your floors are uneven, fix them.  If there are any pets around you, be aware of where they are.  Review your medicines with your doctor. Some medicines can make you feel dizzy. This can increase your chance of falling. Ask your doctor what other things that you can do to help prevent falls. This information is not intended to replace advice given to you by your health care provider. Make sure you discuss any questions you have with your health care provider. Document Released: 03/06/2009 Document Revised: 10/16/2015 Document Reviewed: 06/14/2014 Elsevier Interactive Patient Education  2017 [employer].

## 2018-06-05 ENCOUNTER — Other Ambulatory Visit: Payer: Self-pay

## 2018-06-05 MED ORDER — DULAGLUTIDE 1.5 MG/0.5ML ~~LOC~~ SOAJ: 1.5000 mg | SUBCUTANEOUS | 1 refills | Status: DC

## 2018-06-05 NOTE — Telephone Encounter (Signed)
Upcoming appointments 06/26/2018

## 2018-06-26 ENCOUNTER — Encounter: Payer: Self-pay | Admitting: Nurse Practitioner

## 2018-06-26 ENCOUNTER — Other Ambulatory Visit: Payer: Self-pay

## 2018-06-26 ENCOUNTER — Ambulatory Visit (INDEPENDENT_AMBULATORY_CARE_PROVIDER_SITE_OTHER): Payer: Medicare Other | Admitting: Nurse Practitioner

## 2018-06-26 VITALS — BP 123/77 | HR 86 | Temp 98.2°F | Ht 70.0 in | Wt 213.0 lb

## 2018-06-26 DIAGNOSIS — E1169 Type 2 diabetes mellitus with other specified complication: Secondary | ICD-10-CM | POA: Diagnosis not present

## 2018-06-26 DIAGNOSIS — E538 Deficiency of other specified B group vitamins: Secondary | ICD-10-CM | POA: Diagnosis not present

## 2018-06-26 DIAGNOSIS — E782 Mixed hyperlipidemia: Secondary | ICD-10-CM | POA: Diagnosis not present

## 2018-06-26 DIAGNOSIS — E039 Hypothyroidism, unspecified: Secondary | ICD-10-CM

## 2018-06-26 DIAGNOSIS — E1122 Type 2 diabetes mellitus with diabetic chronic kidney disease: Secondary | ICD-10-CM

## 2018-06-26 DIAGNOSIS — E785 Hyperlipidemia, unspecified: Secondary | ICD-10-CM

## 2018-06-26 DIAGNOSIS — N183 Chronic kidney disease, stage 3 unspecified: Secondary | ICD-10-CM

## 2018-06-26 DIAGNOSIS — I1310 Hypertensive heart and chronic kidney disease without heart failure, with stage 1 through stage 4 chronic kidney disease, or unspecified chronic kidney disease: Secondary | ICD-10-CM

## 2018-06-26 LAB — BAYER DCA HB A1C WAIVED: HB A1C: 7.6 % — AB (ref <7.0)

## 2018-06-26 MED ORDER — SITAGLIPTIN PHOSPHATE 50 MG PO TABS: 50.0000 mg | ORAL_TABLET | Freq: Every day | ORAL | 5 refills | Status: DC

## 2018-06-26 NOTE — Assessment & Plan Note (Signed)
Chronic, ongoing.  Continue current medication regimen.  TSH today and adjust dose as needed.

## 2018-06-26 NOTE — Assessment & Plan Note (Signed)
Chronic, ongoing.  A1C today remains 7.6%.  Will add on renal dose of Januvia to regimen.  Maintain current Metformin dose d/t kidney function and Trulicity.  Return in 3 months.  Continue focus on diet and exercise.

## 2018-06-26 NOTE — Assessment & Plan Note (Signed)
Followed by nephrology. 

## 2018-06-26 NOTE — Assessment & Plan Note (Signed)
Continue focus on diet and exercise. 

## 2018-06-26 NOTE — Progress Notes (Signed)
BP 123/77   Pulse 86   Temp 98.2 F (36.8 C) (Oral)   Ht 5\' 10"  (1.778 m)   Wt 213 lb (96.6 kg)   SpO2 96%   BMI 30.56 kg/m    Subjective:    Patient ID: Jonathan [name], male    DOB: [birth year], 73 y.o.   MRN: [id number]  HPI: Jonathan [name] is a 73 y.o. male  Chief Complaint  Patient presents with  . Diabetes    [id number]/[id number]  . Hypertension   DIABETES Previous A1C was 7.6% in October 2019.  He continues on Metformin at max dose for renal function daily and Trulicity (GLP) at max dose.  Takes Vitamin B12 supplement daily (long term Metformin use).  Is followed by nephrology for CKD and next visit is in March.  Last GFR in October 47 and CRT 1.47. Hypoglycemic episodes:no Polydipsia/polyuria: no Visual disturbance: no Chest pain: no Paresthesias: no Glucose Monitoring: yes  Accucheck frequency: Daily  Fasting glucose: 150 in morning  Post prandial:  Evening:  Before meals: Taking Insulin?: no  Long acting insulin:  Short acting insulin: Blood Pressure Monitoring: daily Retinal Examination: Up to Date Foot Exam: Up to Date Pneumovax: Up to Date Influenza: Up to Date Aspirin: no   HYPERTENSION / HYPERLIPIDEMIA Continues on Lipitor, Metoprolol, and Lisinopril daily without ADR. Satisfied with current treatment? yes Duration of hypertension: chronic BP monitoring frequency: daily BP range: 120-130/70-80 BP medication side effects: no Duration of hyperlipidemia: chronic Cholesterol medication side effects: no Cholesterol supplements: none Medication compliance: good compliance Aspirin: no Recent stressors: no Recurrent headaches: no Visual changes: no Palpitations: no Dyspnea: no Chest pain: no Lower extremity edema: no Dizzy/lightheaded: no   HYPOTHYROIDISM Last TSH 1.370 in October.  Continues on Levothyroxine 125 MCG daily. Thyroid control status:controlled Satisfied with current treatment? no Medication side effects: no Medication compliance: good  compliance Etiology of hypothyroidism:  Recent dose adjustment:no Fatigue: no Cold intolerance: no Heat intolerance: no Weight gain: no Weight loss: no Constipation: no Diarrhea/loose stools: no Palpitations: no Lower extremity edema: no Anxiety/depressed mood: no   VITAMIN B12 DEFICIENCY: Continues on daily supplement with last level at goal.  Relevant past medical, surgical, family and social history reviewed and updated as indicated. Interim medical history since our last visit reviewed. Allergies and medications reviewed and updated.  Review of Systems  Constitutional: Negative for activity change, diaphoresis, fatigue and fever.  Respiratory: Negative for cough, chest tightness, shortness of breath and wheezing.   Cardiovascular: Negative for chest pain, palpitations and leg swelling.  Gastrointestinal: Negative for abdominal distention, abdominal pain, constipation, diarrhea, nausea and vomiting.  Endocrine: Negative for cold intolerance, heat intolerance, polydipsia, polyphagia and polyuria.  Musculoskeletal: Negative.   Skin: Negative.   Neurological: Negative for dizziness, syncope, weakness, light-headedness, numbness and headaches.  Psychiatric/Behavioral: Negative.     Per HPI unless specifically indicated above     Objective:    BP 123/77   Pulse 86   Temp 98.2 F (36.8 C) (Oral)   Ht 5\' 10"  (1.778 m)   Wt 213 lb (96.6 kg)   SpO2 96%   BMI 30.56 kg/m   Wt Readings from Last 3 Encounters:  06/26/18 213 lb (96.6 kg)  05/18/18 227 lb (103 kg)  03/23/18 216 lb 6.4 oz (98.2 kg)    Physical Exam Vitals signs and nursing note reviewed.  Constitutional:      Appearance: He is well-developed.  HENT:     Head: Normocephalic and atraumatic.  Right Ear: Hearing normal. No drainage.     Left Ear: Hearing normal. No drainage.     Mouth/Throat:     Pharynx: Uvula midline.  Eyes:     General: Lids are normal.        Right eye: No discharge.        Left  eye: No discharge.     Conjunctiva/sclera: Conjunctivae normal.     Pupils: Pupils are equal, round, and reactive to light.  Neck:     Musculoskeletal: Normal range of motion and neck supple.     Thyroid: No thyromegaly.     Vascular: No carotid bruit or JVD.     Trachea: Trachea normal.  Cardiovascular:     Rate and Rhythm: Normal rate and regular rhythm.     Heart sounds: Normal heart sounds, S1 normal and S2 normal. No murmur. No gallop.   Pulmonary:     Effort: Pulmonary effort is normal.     Breath sounds: Normal breath sounds.  Abdominal:     General: Bowel sounds are normal.     Palpations: Abdomen is soft. There is no hepatomegaly or splenomegaly.  Musculoskeletal: Normal range of motion.     Right lower leg: No edema.     Left lower leg: No edema.  Skin:    General: Skin is warm and dry.     Capillary Refill: Capillary refill takes less than 2 seconds.     Findings: No rash.  Neurological:     Mental Status: He is alert and oriented to person, place, and time.     Deep Tendon Reflexes: Reflexes are normal and symmetric.  Psychiatric:        Mood and Affect: Mood normal.        Behavior: Behavior normal.        Thought Content: Thought content normal.        Judgment: Judgment normal.     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  Continue current medication regimen.  Followed by nephrology.  Next visit in March. BP below goal today.        Endocrine   Hypothyroidism    Chronic, ongoing.  Continue current medication regimen.  TSH today and adjust dose as needed.      Relevant Orders   Thyroid Panel With TSH   Type 2 diabetes mellitus with  chronic kidney disease, without long-term current use of insulin (HCC) - Primary    Chronic, ongoing.  A1C today remains 7.6%.  Will add on renal dose of Januvia to regimen.  Maintain current Metformin dose d/t kidney function and Trulicity.  Return in 3 months.  Continue focus on diet and exercise.      Relevant Medications   sitaGLIPtin (JANUVIA) 50 MG tablet   Other Relevant Orders   Bayer DCA Hb A1c Waived     Genitourinary   CKD (chronic kidney disease), stage III (HCC)    Followed by nephrology.        Other   Hyperlipidemia   Vitamin B12 deficiency    Check B12 level today.  Relevant Orders   Vitamin B12       Follow up plan: Return in about 3 months (around 09/24/2018) for T2DM, HTN/HLD.

## 2018-06-26 NOTE — Assessment & Plan Note (Signed)
Check B12 level today.  

## 2018-06-26 NOTE — Patient Instructions (Signed)
Sitagliptin oral tablet  What is this medicine?  SITAGLIPTIN (sit a GLIP tin) helps to treat type 2 diabetes. It helps to control blood sugar. Treatment is combined with diet and exercise.  This medicine may be used for other purposes; ask your health care provider or pharmacist if you have questions.  COMMON BRAND NAME(S): Januvia  What should I tell my health care provider before I take this medicine?  They need to know if you have any of these conditions:  -diabetic ketoacidosis  -kidney disease  -pancreatitis  -previous swelling of the tongue, face, or lips with difficulty breathing, difficulty swallowing, hoarseness, or tightening of the throat  -type 1 diabetes  -an unusual or allergic reaction to sitagliptin, other medicines, foods, dyes, or preservatives  -pregnant or trying to get pregnant  -breast-feeding  How should I use this medicine?  Take this medicine by mouth with a glass of water. Follow the directions on the prescription label. You can take it with or without food. Do not cut, crush or chew this medicine. Take your dose at the same time each day. Do not take more often than directed. Do not stop taking except on your doctor's advice.  A special MedGuide will be given to you by the pharmacist with each prescription and refill. Be sure to read this information carefully each time.  Talk to your pediatrician regarding the use of this medicine in children. Special care may be needed.  Overdosage: If you think you have taken too much of this medicine contact a poison control center or emergency room at once.  NOTE: This medicine is only for you. Do not share this medicine with others.  What if I miss a dose?  If you miss a dose, take it as soon as you can. If it is almost time for your next dose, take only that dose. Do not take double or extra doses.  What may interact with this medicine?  Do not take this medicine with any of the following medications:  -gatifloxacin  This medicine may also interact  with the following medications:  -alcohol  -digoxin  -insulin  -sulfonylureas like glimepiride, glipizide, glyburide  This list may not describe all possible interactions. Give your health care provider a list of all the medicines, herbs, non-prescription drugs, or dietary supplements you use. Also tell them if you smoke, drink alcohol, or use illegal drugs. Some items may interact with your medicine.  What should I watch for while using this medicine?  Visit your doctor or health care professional for regular checks on your progress.  A test called the HbA1C (A1C) will be monitored. This is a simple blood test. It measures your blood sugar control over the last 2 to 3 months. You will receive this test every 3 to 6 months.  Learn how to check your blood sugar. Learn the symptoms of low and high blood sugar and how to manage them.  Always carry a quick-source of sugar with you in case you have symptoms of low blood sugar. Examples include hard sugar candy or glucose tablets. Make sure others know that you can choke if you eat or drink when you develop serious symptoms of low blood sugar, such as seizures or unconsciousness. They must get medical help at once.  Tell your doctor or health care professional if you have high blood sugar. You might need to change the dose of your medicine. If you are sick or exercising more than usual, you might need to   change the dose of your medicine.  Do not skip meals. Ask your doctor or health care professional if you should avoid alcohol. Many nonprescription cough and cold products contain sugar or alcohol. These can affect blood sugar.  Wear a medical ID bracelet or chain, and carry a card that describes your disease and details of your medicine and dosage times.  What side effects may I notice from receiving this medicine?  Side effects that you should report to your doctor or health care professional as soon as possible:  -allergic reactions like skin rash, itching or hives,  swelling of the face, lips, or tongue  -breathing problems  -general ill feeling or flu-like symptoms  -joint pain  -loss of appetite  -redness, blistering, peeling or loosening of the skin, including inside the mouth  -signs and symptoms of heart failure like breathing problems, fast, irregular heartbeat, sudden weight gain; swelling of the ankles, feet, hands; unusually weak or tired  -signs and symptoms of low blood sugar such as feeling anxious, confusion, dizziness, increased hunger, unusually weak or tired, sweating, shakiness, cold, irritable, headache, blurred vision, fast heartbeat, loss of consciousness  -signs and symptoms of muscle injury like dark urine; trouble passing urine or change in the amount of urine; unusually weak or tired; muscle pain; back pain  -unusual stomach upset or pain  -vomiting  Side effects that usually do not require medical attention (report to your doctor or health care professional if they continue or are bothersome):  -diarrhea  -headache  -sore throat  -stomach upset  -stuffy or runny nose  This list may not describe all possible side effects. Call your doctor for medical advice about side effects. You may report side effects to FDA at 1-800-FDA-1088.  Where should I keep my medicine?  Keep out of the reach of children.  Store at room temperature between 15 and 30 degrees C (59 and 86 degrees F). Throw away any unused medicine after the expiration date.  NOTE: This sheet is a summary. It may not cover all possible information. If you have questions about this medicine, talk to your doctor, pharmacist, or health care provider.  © 2019 [employer]vier/Gold Standard (2017-12-13 16:38:43)

## 2018-06-26 NOTE — Assessment & Plan Note (Signed)
Chronic, ongoing.  Continue current medication regimen.  Followed by nephrology.  Next visit in March. BP below goal today.

## 2018-06-27 LAB — LIPID PANEL W/O CHOL/HDL RATIO
CHOLESTEROL TOTAL: 140 mg/dL (ref 100–199)
HDL: 42 mg/dL (ref >39)
LDL Calculated: 70 mg/dL (ref 0–99)
TRIGLYCERIDES: 138 mg/dL (ref 0–149)
VLDL Cholesterol Cal: 28 mg/dL (ref 5–40)

## 2018-06-27 LAB — THYROID PANEL WITH TSH
Free Thyroxine Index: 3 (ref 1.2–4.9)
T3 UPTAKE RATIO: 29 % (ref 24–39)
T4 TOTAL: 10.3 ug/dL (ref 4.5–12.0)
TSH: 1.01 u[IU]/mL (ref 0.450–4.500)

## 2018-06-27 LAB — VITAMIN B12: Vitamin B-12: 592 pg/mL (ref 232–1245)

## 2018-07-27 DIAGNOSIS — I10 Essential (primary) hypertension: Secondary | ICD-10-CM | POA: Diagnosis not present

## 2018-07-27 DIAGNOSIS — E782 Mixed hyperlipidemia: Secondary | ICD-10-CM | POA: Diagnosis not present

## 2018-07-27 DIAGNOSIS — I480 Paroxysmal atrial fibrillation: Secondary | ICD-10-CM | POA: Diagnosis not present

## 2018-07-27 DIAGNOSIS — E119 Type 2 diabetes mellitus without complications: Secondary | ICD-10-CM | POA: Diagnosis not present

## 2018-08-02 ENCOUNTER — Other Ambulatory Visit: Payer: Self-pay | Admitting: Nurse Practitioner

## 2018-08-24 ENCOUNTER — Other Ambulatory Visit: Payer: Self-pay | Admitting: Unknown Physician Specialty

## 2018-09-19 ENCOUNTER — Other Ambulatory Visit: Payer: Self-pay | Admitting: Nurse Practitioner

## 2018-09-19 NOTE — Telephone Encounter (Signed)
Requested medication (s) are due for refill today:   Yes  Requested medication (s) are on the active medication list:   Yes  Future visit scheduled:   Yes 09/28/2018 with Cannady   Last ordered: 08/24/2018  #60  0 refills.   He has had his 30 day courtesy refill.   He should have enough to last until his appt on 09/28/2018.   Requested Prescriptions  Pending Prescriptions Disp Refills   metFORMIN (GLUCOPHAGE) 500 MG tablet [Pharmacy Med Name: METFORMIN HCL 500 MG TABLET] 60 tablet 0    Sig: TAKE 1 TABLET (500 MG TOTAL) BY MOUTH 2 (TWO) TIMES DAILY WITH A MEAL.     Endocrinology:  Diabetes - Biguanides Failed - 09/19/2018  2:36 PM      Failed - Cr in normal range and within 360 days    Creatinine  Date Value Ref Range Status  11/15/2011 1.41 (H) 0.60 - 1.30 mg/dL Final   Creatinine, Ser  Date Value Ref Range Status  12/16/2017 1.65 (H) 0.76 - 1.27 mg/dL Final         Failed - eGFR in normal range and within 360 days    EGFR (African American)  Date Value Ref Range Status  11/15/2011 60 (L)  Final   GFR calc Af Amer  Date Value Ref Range Status  12/16/2017 47 (L) >59 mL/min/1.73 Final   EGFR (Non-African Amer.)  Date Value Ref Range Status  11/15/2011 52 (L)  Final    Comment:    eGFR values <2m/min/1.73 m2 may be an indication of chronic kidney disease (CKD). Calculated eGFR is useful in patients with stable renal function. The eGFR calculation will not be reliable in acutely ill patients when serum creatinine is changing rapidly. It is not useful in  patients on dialysis. The eGFR calculation may not be applicable to patients at the low and high extremes of body sizes, pregnant women, and vegetarians.    GFR calc non Af Amer  Date Value Ref Range Status  12/16/2017 41 (L) >59 mL/min/1.73 Final         Passed - HBA1C is between 0 and 7.9 and within 180 days    HB A1C (BAYER DCA - WAIVED)  Date Value Ref Range Status  06/26/2018 7.6 (H) <7.0 % Final    Comment:                                        Diabetic Adult            <7.0                                       Healthy Adult        4.3 - 5.7                                                           (DCCT/NGSP) American Diabetes Association's Summary of Glycemic Recommendations for Adults with Diabetes: Hemoglobin A1c <7.0%. More stringent glycemic goals (A1c <6.0%) may further reduce complications at the cost of increased risk of hypoglycemia.  Passed - Valid encounter within last 6 months    Recent Outpatient Visits          2 months ago Type 2 diabetes mellitus with stage 3 chronic kidney disease, without long-term current use of insulin ([place])   [place], Jolene T, NP   6 months ago Type 2 diabetes mellitus with stage 3 chronic kidney disease, without long-term current use of insulin ([place])   [place], Jolene T, NP   9 months ago Type 2 diabetes mellitus with stage 3 chronic kidney disease, without long-term current use of insulin ([place])   [facility] [place], Malachy Mood, NP   1 year ago Type 2 diabetes mellitus with chronic kidney disease, without long-term current use of insulin, unspecified CKD stage ([place])   [place] Kathrine Haddock, NP   1 year ago Thrombocytopenia ([place])   [place] Kathrine Haddock, NP      Future Appointments            In 1 week Cannady, Barbaraann Faster, NP [facility]   In 8 months  [facility]

## 2018-09-28 ENCOUNTER — Ambulatory Visit: Payer: Medicare Other | Admitting: Nurse Practitioner

## 2018-10-05 ENCOUNTER — Other Ambulatory Visit: Payer: Self-pay | Admitting: Unknown Physician Specialty

## 2018-10-26 ENCOUNTER — Other Ambulatory Visit: Payer: Self-pay | Admitting: Nurse Practitioner

## 2018-10-26 NOTE — Telephone Encounter (Signed)
Requested Prescriptions  Pending Prescriptions Disp Refills  . levothyroxine (SYNTHROID) 125 MCG tablet [Pharmacy Med Name: LEVOTHYROXINE 125 MCG TABLET] 90 tablet 0    Sig: TAKE 1 TABLET BY MOUTH EVERY DAY     Endocrinology:  Hypothyroid Agents Failed - 10/26/2018  1:42 AM      Failed - TSH needs to be rechecked within 3 months after an abnormal result. Refill until TSH is due.      Passed - TSH in normal range and within 360 days    TSH  Date Value Ref Range Status  06/26/2018 1.010 0.450 - 4.500 uIU/mL Final         Passed - Valid encounter within last 12 months    Recent Outpatient Visits          4 months ago Type 2 diabetes mellitus with stage 3 chronic kidney disease, without long-term current use of insulin (HCC)   [facility] [place], Jolene T, NP   7 months ago Type 2 diabetes mellitus with stage 3 chronic kidney disease, without long-term current use of insulin (HCC)   [facility] [place], Jolene T, NP   10 months ago Type 2 diabetes mellitus with stage 3 chronic kidney disease, without long-term current use of insulin (HCC)   [facility] [place], Elnita Maxwell, NP   1 year ago Type 2 diabetes mellitus with chronic kidney disease, without long-term current use of insulin, unspecified CKD stage (HCC)   [facility] Gabriel Cirri, NP   1 year ago Thrombocytopenia (HCC)   [facility] Gabriel Cirri, NP      Future Appointments            In 6 days Cannady, Dorie Rank, NP [facility]   In 6 months  [facility]

## 2018-10-27 ENCOUNTER — Other Ambulatory Visit: Payer: Self-pay | Admitting: Unknown Physician Specialty

## 2018-10-29 ENCOUNTER — Other Ambulatory Visit: Payer: Self-pay | Admitting: Nurse Practitioner

## 2018-10-29 NOTE — Telephone Encounter (Signed)
Requested Prescriptions  Pending Prescriptions Disp Refills  . metoprolol tartrate (LOPRESSOR) 50 MG tablet [Pharmacy Med Name: METOPROLOL TARTRATE 50 MG TAB] 180 tablet 1    Sig: TAKE 1 TABLET BY MOUTH TWICE A DAY     Cardiovascular:  Beta Blockers Passed - 10/29/2018  9:16 AM      Passed - Last BP in normal range    BP Readings from Last 1 Encounters:  06/26/18 123/77         Passed - Last Heart Rate in normal range    Pulse Readings from Last 1 Encounters:  06/26/18 86         Passed - Valid encounter within last 6 months    Recent Outpatient Visits          4 months ago Type 2 diabetes mellitus with stage 3 chronic kidney disease, without long-term current use of insulin ([place])   [place], Jolene T, NP   7 months ago Type 2 diabetes mellitus with stage 3 chronic kidney disease, without long-term current use of insulin ([place])   [place], Jolene T, NP   10 months ago Type 2 diabetes mellitus with stage 3 chronic kidney disease, without long-term current use of insulin ([place])   [facility] [place], Malachy Mood, NP   1 year ago Type 2 diabetes mellitus with chronic kidney disease, without long-term current use of insulin, unspecified CKD stage ([place])   [facility] Kathrine Haddock, NP   1 year ago Thrombocytopenia ([place])   [place] Kathrine Haddock, NP      Future Appointments            In 3 days Cannady, Barbaraann Faster, NP [facility]   In 6 months  [facility]

## 2018-11-01 ENCOUNTER — Encounter: Payer: Self-pay | Admitting: Nurse Practitioner

## 2018-11-01 ENCOUNTER — Other Ambulatory Visit: Payer: Self-pay

## 2018-11-01 ENCOUNTER — Ambulatory Visit (INDEPENDENT_AMBULATORY_CARE_PROVIDER_SITE_OTHER): Payer: Medicare Other | Admitting: Nurse Practitioner

## 2018-11-01 VITALS — BP 127/81 | HR 89 | Temp 98.3°F

## 2018-11-01 DIAGNOSIS — E785 Hyperlipidemia, unspecified: Secondary | ICD-10-CM | POA: Diagnosis not present

## 2018-11-01 DIAGNOSIS — N183 Chronic kidney disease, stage 3 unspecified: Secondary | ICD-10-CM

## 2018-11-01 DIAGNOSIS — E1122 Type 2 diabetes mellitus with diabetic chronic kidney disease: Secondary | ICD-10-CM | POA: Diagnosis not present

## 2018-11-01 DIAGNOSIS — E1169 Type 2 diabetes mellitus with other specified complication: Secondary | ICD-10-CM

## 2018-11-01 DIAGNOSIS — I1310 Hypertensive heart and chronic kidney disease without heart failure, with stage 1 through stage 4 chronic kidney disease, or unspecified chronic kidney disease: Secondary | ICD-10-CM | POA: Diagnosis not present

## 2018-11-01 LAB — BAYER DCA HB A1C WAIVED: HB A1C (BAYER DCA - WAIVED): 7.4 % — ABNORMAL HIGH (ref <7.0)

## 2018-11-01 LAB — MICROALBUMIN, URINE WAIVED
Creatinine, Urine Waived: 300 mg/dL (ref 10–300)
Microalb, Ur Waived: 30 mg/L — ABNORMAL HIGH (ref 0–19)
Microalb/Creat Ratio: <30 mg/g (ref <30)

## 2018-11-01 MED ORDER — ATORVASTATIN CALCIUM 80 MG PO TABS: 80.0000 mg | ORAL_TABLET | Freq: Every day | ORAL | 2 refills | Status: DC

## 2018-11-01 MED ORDER — LEVOTHYROXINE SODIUM 125 MCG PO TABS: 125.0000 ug | ORAL_TABLET | Freq: Every day | ORAL | 2 refills | Status: DC

## 2018-11-01 MED ORDER — LISINOPRIL 20 MG PO TABS: 20.0000 mg | ORAL_TABLET | Freq: Every day | ORAL | 2 refills | Status: DC

## 2018-11-01 MED ORDER — DULAGLUTIDE 1.5 MG/0.5ML ~~LOC~~ SOAJ: 1.5000 mg | SUBCUTANEOUS | 3 refills | Status: DC

## 2018-11-01 MED ORDER — SITAGLIPTIN PHOSPHATE 50 MG PO TABS: 50.0000 mg | ORAL_TABLET | Freq: Every day | ORAL | 5 refills | Status: DC

## 2018-11-01 NOTE — Assessment & Plan Note (Signed)
Chronic, ongoing.  A1C slightly decreased today at 7.4%.  Continue current medication regimen (renal dosed) and he wishes to focus more on diet over next few months.  If continue elevations may need to consider Glipizide and d/c Januvia.  Renal function limits medications that can be used.  Recommend he check blood sugar at least once a day in morning and document these for provider.  Provided information to him on CCM team, he wishes to wait on referral.  Return in 3 months.

## 2018-11-01 NOTE — Assessment & Plan Note (Signed)
Chronic, ongoing.  Urine micro 30 and A:C < 30, continue Lisinopril for kidney protection.  Continue current medication regimen and collaboration with cardiology and nephrology.  BP at goal.  Return in 3 months.

## 2018-11-01 NOTE — Patient Instructions (Signed)
Carbohydrate Counting for Diabetes Mellitus, Adult  Carbohydrate counting is a method of keeping track of how many carbohydrates you eat. Eating carbohydrates naturally increases the amount of sugar (glucose) in the blood. Counting how many carbohydrates you eat helps keep your blood glucose within normal limits, which helps you manage your diabetes (diabetes mellitus). It is important to know how many carbohydrates you can safely have in each meal. This is different for every person. A diet and nutrition specialist (registered dietitian) can help you make a meal plan and calculate how many carbohydrates you should have at each meal and snack. Carbohydrates are found in the following foods:  Grains, such as breads and cereals.  Dried beans and soy products.  Starchy vegetables, such as potatoes, peas, and corn.  Fruit and fruit juices.  Milk and yogurt.  Sweets and snack foods, such as cake, cookies, candy, chips, and soft drinks. How do I count carbohydrates? There are two ways to count carbohydrates in food. You can use either of the methods or a combination of both. Reading "Nutrition Facts" on packaged food The "Nutrition Facts" list is included on the labels of almost all packaged foods and beverages in the [place] It includes:  The serving size.  Information about nutrients in each serving, including the grams (g) of carbohydrate per serving. To use the "Nutrition Facts":  Decide how many servings you will have.  Multiply the number of servings by the number of carbohydrates per serving.  The resulting number is the total amount of carbohydrates that you will be having. Learning standard serving sizes of other foods When you eat carbohydrate foods that are not packaged or do not include "Nutrition Facts" on the label, you need to measure the servings in order to count the amount of carbohydrates:  Measure the foods that you will eat with a food scale or measuring cup, if needed.   Decide how many standard-size servings you will eat.  Multiply the number of servings by 15. Most carbohydrate-rich foods have about 15 g of carbohydrates per serving. ? For example, if you eat 8 oz (170 g) of strawberries, you will have eaten 2 servings and 30 g of carbohydrates (2 servings x 15 g = 30 g).  For foods that have more than one food mixed, such as soups and casseroles, you must count the carbohydrates in each food that is included. The following list contains standard serving sizes of common carbohydrate-rich foods. Each of these servings has about 15 g of carbohydrates:   hamburger bun or  English muffin.   oz (15 mL) syrup.   oz (14 g) jelly.  1 slice of bread.  1 six-inch tortilla.  3 oz (85 g) cooked rice or pasta.  4 oz (113 g) cooked dried beans.  4 oz (113 g) starchy vegetable, such as peas, corn, or potatoes.  4 oz (113 g) hot cereal.  4 oz (113 g) mashed potatoes or  of a large baked potato.  4 oz (113 g) canned or frozen fruit.  4 oz (120 mL) fruit juice.  4-6 crackers.  6 chicken nuggets.  6 oz (170 g) unsweetened dry cereal.  6 oz (170 g) plain fat-free yogurt or yogurt sweetened with artificial sweeteners.  8 oz (240 mL) milk.  8 oz (170 g) fresh fruit or one small piece of fruit.  24 oz (680 g) popped popcorn. Example of carbohydrate counting Sample meal  3 oz (85 g) chicken breast.  6 oz (170 g)   brown rice.  4 oz (113 g) corn.  8 oz (240 mL) milk.  8 oz (170 g) strawberries with sugar-free whipped topping. Carbohydrate calculation 1. Identify the foods that contain carbohydrates: ? Rice. ? Corn. ? Milk. ? Strawberries. 2. Calculate how many servings you have of each food: ? 2 servings rice. ? 1 serving corn. ? 1 serving milk. ? 1 serving strawberries. 3. Multiply each number of servings by 15 g: ? 2 servings rice x 15 g = 30 g. ? 1 serving corn x 15 g = 15 g. ? 1 serving milk x 15 g = 15 g. ? 1 serving  strawberries x 15 g = 15 g. 4. Add together all of the amounts to find the total grams of carbohydrates eaten: ? 30 g + 15 g + 15 g + 15 g = 75 g of carbohydrates total. Summary  Carbohydrate counting is a method of keeping track of how many carbohydrates you eat.  Eating carbohydrates naturally increases the amount of sugar (glucose) in the blood.  Counting how many carbohydrates you eat helps keep your blood glucose within normal limits, which helps you manage your diabetes.  A diet and nutrition specialist (registered dietitian) can help you make a meal plan and calculate how many carbohydrates you should have at each meal and snack. This information is not intended to replace advice given to you by your health care provider. Make sure you discuss any questions you have with your health care provider. Document Released: 05/10/2005 Document Revised: 11/17/2016 Document Reviewed: 10/22/2015 Elsevier Interactive Patient Education  2019 [employer]  

## 2018-11-01 NOTE — Assessment & Plan Note (Signed)
Continue to collaborate with nephrology.

## 2018-11-01 NOTE — Progress Notes (Signed)
BP 127/81   Pulse 89   Temp 98.3 F (36.8 C) (Oral)   SpO2 98%    Subjective:    Patient ID: Jonathan [name], male    DOB: 02-25-1946, 73 y.o.   MRN: [id number]  HPI: Jonathan [name] is a 73 y.o. male  Chief Complaint  Patient presents with  . Diabetes  . Hyperlipidemia  . Hypertension   DIABETES Last A1C was 7.6%.  Januvia 50 MG was added to regimen, renal dosed.  Continues on Trulicity 1.5 MG weekly and Metformin 500 MG BID.  Reports he has not been doing well with diet, eating lots carbohydrates.   Hypoglycemic episodes:no Polydipsia/polyuria: no Visual disturbance: no Chest pain: no Paresthesias: no Glucose Monitoring: no  Accucheck frequency: Not Checking  Fasting glucose:  Post prandial:  Evening:  Before meals: Taking Insulin?: no  Long acting insulin:  Short acting insulin: Blood Pressure Monitoring: daily Retinal Examination: Up to Date Foot Exam: Up to Date Pneumovax: Up to Date Influenza: Up to Date Aspirin: no   HYPERTENSION / HYPERLIPIDEMIA Continues on Lisinopril 20 MG, Metoprolol 50 MG BID, and Atorvastatin 80 MG.  He goes to nephrology, but has been stable with kidney function for the past year in CKD 3.  Sees Dr. Nehemiah Massed for cardiology.   Satisfied with current treatment? yes Duration of hypertension: chronic BP monitoring frequency: daily BP range: 120-130/80's BP medication side effects: no Duration of hyperlipidemia: chronic Cholesterol medication side effects: no Cholesterol supplements: none Medication compliance: good compliance Aspirin: no Recent stressors: no Recurrent headaches: no Visual changes: no Palpitations: no Dyspnea: no Chest pain: no Lower extremity edema: no Dizzy/lightheaded: no   CHRONIC KIDNEY DISEASE CKD status: stable Medications renally dose: yes Previous renal evaluation: no Pneumovax:  Up to Date Influenza Vaccine:  Up to Date  Relevant past medical, surgical, family and social history reviewed and  updated as indicated. Interim medical history since our last visit reviewed. Allergies and medications reviewed and updated.  Review of Systems  Constitutional: Negative for activity change, diaphoresis, fatigue and fever.  Respiratory: Negative for cough, chest tightness, shortness of breath and wheezing.   Cardiovascular: Negative for chest pain, palpitations and leg swelling.  Gastrointestinal: Negative for abdominal distention, abdominal pain, constipation, diarrhea, nausea and vomiting.  Endocrine: Negative for cold intolerance, heat intolerance, polydipsia, polyphagia and polyuria.  Musculoskeletal: Negative.   Skin: Negative.   Neurological: Negative for dizziness, syncope, weakness, light-headedness, numbness and headaches.  Psychiatric/Behavioral: Negative.     Per HPI unless specifically indicated above     Objective:    BP 127/81   Pulse 89   Temp 98.3 F (36.8 C) (Oral)   SpO2 98%   Wt Readings from Last 3 Encounters:  06/26/18 213 lb (96.6 kg)  05/18/18 227 lb (103 kg)  03/23/18 216 lb 6.4 oz (98.2 kg)    Physical Exam Vitals signs and nursing note reviewed.  Constitutional:      Appearance: He is well-developed.  HENT:     Head: Normocephalic and atraumatic.     Right Ear: Hearing normal. No drainage.     Left Ear: Hearing normal. No drainage.     Mouth/Throat:     Pharynx: Uvula midline.  Eyes:     General: Lids are normal.        Right eye: No discharge.        Left eye: No discharge.     Conjunctiva/sclera: Conjunctivae normal.     Pupils: Pupils are equal,  round, and reactive to light.  Neck:     Musculoskeletal: Normal range of motion and neck supple.     Thyroid: No thyromegaly.     Vascular: No carotid bruit or JVD.     Trachea: Trachea normal.  Cardiovascular:     Rate and Rhythm: Normal rate and regular rhythm.     Heart sounds: Normal heart sounds, S1 normal and S2 normal. No murmur. No gallop.   Pulmonary:     Effort: Pulmonary effort is  normal.     Breath sounds: Normal breath sounds.  Abdominal:     General: Bowel sounds are normal.     Palpations: Abdomen is soft. There is no hepatomegaly or splenomegaly.  Musculoskeletal: Normal range of motion.     Right lower leg: No edema.     Left lower leg: No edema.  Skin:    General: Skin is warm and dry.     Capillary Refill: Capillary refill takes less than 2 seconds.     Findings: No rash.  Neurological:     Mental Status: He is alert and oriented to person, place, and time.     Deep Tendon Reflexes: Reflexes are normal and symmetric.  Psychiatric:        Mood and Affect: Mood normal.        Behavior: Behavior normal.        Thought Content: Thought content normal.        Judgment: Judgment normal.     Diabetic Foot Exam - Simple   Simple Foot Form Visual Inspection No deformities, no ulcerations, no other skin breakdown bilaterally:  Yes Sensation Testing Intact to touch and monofilament testing bilaterally:  Yes Pulse Check Posterior Tibialis and Dorsalis pulse intact bilaterally:  Yes Comments     Results for orders placed or performed in visit on 11/01/18  Bayer DCA Hb A1c Waived  Result Value Ref Range   HB A1C (BAYER DCA - WAIVED) 7.4 (H) <7.0 %  Microalbumin, Urine Waived  Result Value Ref Range   Microalb, Ur Waived 30 (H) 0 - 19 mg/L   Creatinine, Urine Waived 300 10 - 300 mg/dL   Microalb/Creat Ratio <30 <30 mg/g      Assessment & Plan:   Problem List Items Addressed This Visit      Cardiovascular and Mediastinum   Hypertensive heart/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  Urine micro 30 and A:C < 30, continue Lisinopril for kidney protection.  Continue current medication regimen and collaboration with cardiology and nephrology.  BP at goal.  Return in 3 months.      Relevant Medications   lisinopril (ZESTRIL) 20 MG tablet   atorvastatin (LIPITOR) 80 MG tablet     Endocrine   Hyperlipidemia  associated with type 2 diabetes mellitus (HCC)    Chronic, stable.  Continue current medication regimen.        Relevant Medications   sitaGLIPtin (JANUVIA) 50 MG tablet   lisinopril (ZESTRIL) 20 MG tablet   Dulaglutide (TRULICITY) 1.5 UX/3.2TF SOPN   atorvastatin (LIPITOR) 80 MG tablet   Type 2 diabetes mellitus with chronic kidney disease, without long-term current use of insulin (HCC) - Primary    Chronic, ongoing.  A1C slightly decreased today at 7.4%.  Continue current medication regimen (renal dosed) and he wishes to focus more on diet over next few months.  If continue elevations may need to consider Glipizide and d/c Januvia.  Renal  function limits medications that can be used.  Recommend he check blood sugar at least once a day in morning and document these for provider.  Provided information to him on CCM team, he wishes to wait on referral.  Return in 3 months.      Relevant Medications   sitaGLIPtin (JANUVIA) 50 MG tablet   lisinopril (ZESTRIL) 20 MG tablet   Dulaglutide (TRULICITY) 1.5 VX/4.8AX SOPN   atorvastatin (LIPITOR) 80 MG tablet   Other Relevant Orders   Bayer DCA Hb A1c Waived (Completed)   Microalbumin, Urine Waived (Completed)   Comp Met (CMET)     Genitourinary   CKD (chronic kidney disease), stage III (HCC)    Continue to collaborate with nephrology.          Follow up plan: Return in about 3 months (around 02/01/2019) for T2DM, HTN/HLD.

## 2018-11-01 NOTE — Assessment & Plan Note (Signed)
Chronic, stable.  Continue current medication regimen. 

## 2018-11-02 LAB — COMPREHENSIVE METABOLIC PANEL
ALT: 16 IU/L (ref 0–44)
AST: 19 IU/L (ref 0–40)
Albumin/Globulin Ratio: 2.3 — ABNORMAL HIGH (ref 1.2–2.2)
Albumin: 4.8 g/dL — ABNORMAL HIGH (ref 3.7–4.7)
Alkaline Phosphatase: 94 IU/L (ref 39–117)
BUN/Creatinine Ratio: 17 (ref 10–24)
BUN: 25 mg/dL (ref 8–27)
Bilirubin Total: 0.8 mg/dL (ref 0.0–1.2)
CO2: 21 mmol/L (ref 20–29)
Calcium: 10.1 mg/dL (ref 8.6–10.2)
Chloride: 102 mmol/L (ref 96–106)
Creatinine, Ser: 1.47 mg/dL — ABNORMAL HIGH (ref 0.76–1.27)
GFR calc Af Amer: 54 mL/min/{1.73_m2} — ABNORMAL LOW (ref >59)
GFR calc non Af Amer: 47 mL/min/{1.73_m2} — ABNORMAL LOW (ref >59)
Globulin, Total: 2.1 g/dL (ref 1.5–4.5)
Glucose: 155 mg/dL — ABNORMAL HIGH (ref 65–99)
Potassium: 5.2 mmol/L (ref 3.5–5.2)
Sodium: 138 mmol/L (ref 134–144)
Total Protein: 6.9 g/dL (ref 6.0–8.5)

## 2018-11-08 ENCOUNTER — Encounter: Payer: Self-pay | Admitting: Nurse Practitioner

## 2018-11-08 ENCOUNTER — Other Ambulatory Visit: Payer: Self-pay

## 2018-11-08 ENCOUNTER — Ambulatory Visit (INDEPENDENT_AMBULATORY_CARE_PROVIDER_SITE_OTHER): Payer: Medicare Other | Admitting: Nurse Practitioner

## 2018-11-08 DIAGNOSIS — T148XXA Other injury of unspecified body region, initial encounter: Secondary | ICD-10-CM | POA: Diagnosis not present

## 2018-11-08 DIAGNOSIS — L089 Local infection of the skin and subcutaneous tissue, unspecified: Secondary | ICD-10-CM | POA: Insufficient documentation

## 2018-11-08 DIAGNOSIS — W540XXA Bitten by dog, initial encounter: Secondary | ICD-10-CM

## 2018-11-08 MED ORDER — AMOXICILLIN-POT CLAVULANATE 875-125 MG PO TABS: 1.0000 | ORAL_TABLET | Freq: Two times a day (BID) | ORAL | 0 refills | Status: AC

## 2018-11-08 MED ORDER — MUPIROCIN 2 % EX OINT: 1.0000 "application " | TOPICAL_OINTMENT | Freq: Two times a day (BID) | CUTANEOUS | 0 refills | Status: DC

## 2018-11-08 NOTE — Patient Instructions (Signed)
Animal Bite, Adult Animal bite wounds can be mild or serious. It is important to get medical treatment to prevent infection. Ask your doctor if you need treatment to prevent an infection that can spread from animals to humans (rabies). Follow these instructions at home: Wound care   Follow instructions from your doctor about how to take care of your wound. Make sure you: ? Wash your hands with soap and water before you change your bandage (dressing). If you cannot use soap and water, use hand sanitizer. ? Change your bandage as told by your doctor. ? Leave stitches (sutures), skin glue, or skin tape (adhesive) strips in place. They may need to stay in place for 2 weeks or longer. If tape strips get loose and curl up, you may trim the loose edges. Do not remove tape strips completely unless your doctor says it is okay.  Check your wound every day for signs of infection. Check for: ? More redness, swelling, or pain. ? More fluid or blood. ? Warmth. ? Pus or a bad smell. Medicines  Take or apply over-the-counter and prescription medicines only as told by your doctor.  If you were prescribed an antibiotic, take or apply it as told by your doctor. Do not stop using the antibiotic even if your wound gets better. General instructions   Keep the injured area raised (elevated) above the level of your heart while you are sitting or lying down.  If directed, put ice on the injured area. ? Put ice in a plastic bag. ? Place a towel between your skin and the bag. ? Leave the ice on for 20 minutes, 2-3 times per day.  Keep all follow-up visits as told by your doctor. This is important. Contact a doctor if:  You have more redness, swelling, or pain around your wound.  Your wound feels warm to the touch.  You have a fever or chills.  You have a general feeling of sickness (malaise).  You feel sick to your stomach (nauseous).  You throw up (vomit).  You have pain that does not get  better. Get help right away if:  You have a red streak going away from your wound.  You have any of these coming from your wound: ? Non-clear fluid. ? More blood. ? Pus or a bad smell.  You have trouble moving your injured area.  You lose feeling (have numbness) or feel tingling anywhere on your body. Summary  It is important to get the right medical treatment for animal bites. Treatment can help you to not get an infection. Ask your doctor if you need treatment to prevent an infection that can spread from animals to humans (rabies).  Check your wound every day for signs of infection, such as more redness or swelling instead of less.  If you have a red streak going away from your wound, get medical help right away. This information is not intended to replace advice given to you by your health care provider. Make sure you discuss any questions you have with your health care provider. Document Released: 05/10/2005 Document Revised: 11/18/2016 Document Reviewed: 11/18/2016 Elsevier Interactive Patient Education  2019 [employer] Inc.  

## 2018-11-08 NOTE — Assessment & Plan Note (Addendum)
Dog bite to right dorsal hand with full ROM of hand.  Up to date on tetanus.  Script for Augmentin and Mupirocin sent.  Continue to monitor area for increased s/s infection and alert provider if present.  Return in 1 one week for wound check.

## 2018-11-08 NOTE — Progress Notes (Addendum)
BP 122/79   Pulse 89   Temp 98.3 F (36.8 C) (Oral)   Wt 215 lb (97.5 kg)   SpO2 95%   BMI 30.85 kg/m    Subjective:    Patient ID: Jonathan [name], male    DOB: [birth year], 73 y.o.   MRN: [id number]  HPI: Jonathan [name] is a 73 y.o. male  Chief Complaint  Patient presents with  . Animal Bite    x 5 days ago, right hand. A little sore.    DOG BITE RIGHT HAND: Had a do bite happen about 5 days ago.  Yard people came to do yard at his daughter's house, he tried to [employer].  He turned around and dog nipped him on top of right hand.  States he cleaned area well and has been caring for it with ointment at home.  Reports only mild tenderness felt when makes fist, but other than this no pain or difficulty with moving hand.  Tetanus was done in 2015 and he reports dog has had all his shot.  Denies any pain today.  Denies fever, chills, SOB, or CP.  Does endorse mild redness and swelling around bite mark.  Is T2DM.  Relevant past medical, surgical, family and social history reviewed and updated as indicated. Interim medical history since our last visit reviewed. Allergies and medications reviewed and updated.  Review of Systems  Constitutional: Negative for activity change, diaphoresis, fatigue and fever.  Respiratory: Negative for cough, chest tightness, shortness of breath and wheezing.   Cardiovascular: Negative for chest pain, palpitations and leg swelling.  Gastrointestinal: Negative for abdominal distention, abdominal pain, constipation, diarrhea, nausea and vomiting.  Musculoskeletal: Negative.   Skin: Positive for wound.  Neurological: Negative for dizziness, syncope, weakness, light-headedness, numbness and headaches.  Psychiatric/Behavioral: Negative.     Per HPI unless specifically indicated above     Objective:    BP 122/79   Pulse 89   Temp 98.3 F (36.8 C) (Oral)   Wt 215 lb (97.5 kg)   SpO2 95%   BMI 30.85 kg/m   Wt Readings from Last 3 Encounters:   11/08/18 215 lb (97.5 kg)  06/26/18 213 lb (96.6 kg)  05/18/18 227 lb (103 kg)    Physical Exam Vitals signs and nursing note reviewed.  Constitutional:      General: He is awake. He is not in acute distress.    Appearance: He is well-developed. He is not ill-appearing.  HENT:     Head: Normocephalic and atraumatic.     Right Ear: Hearing normal. No drainage.     Left Ear: Hearing normal. No drainage.     Mouth/Throat:     Pharynx: Uvula midline.  Eyes:     General: Lids are normal.        Right eye: No discharge.        Left eye: No discharge.     Conjunctiva/sclera: Conjunctivae normal.     Pupils: Pupils are equal, round, and reactive to light.  Neck:     Musculoskeletal: Normal range of motion and neck supple.  Cardiovascular:     Rate and Rhythm: Normal rate and regular rhythm.     Heart sounds: Normal heart sounds, S1 normal and S2 normal. No murmur. No gallop.   Pulmonary:     Effort: Pulmonary effort is normal. No accessory muscle usage or respiratory distress.     Breath sounds: Normal breath sounds.  Abdominal:     General: Bowel  sounds are normal.     Palpations: Abdomen is soft.  Musculoskeletal: Normal range of motion.     Right hand: He exhibits laceration and swelling (mild at wound site). He exhibits normal range of motion, no tenderness and normal capillary refill. Normal sensation noted. Normal strength noted.     Left hand: He exhibits normal range of motion, no tenderness, normal capillary refill, no laceration and no swelling. Normal sensation noted. Normal strength noted.     Right lower leg: No edema.     Left lower leg: No edema.  Skin:    General: Skin is warm and dry.     Capillary Refill: Capillary refill takes less than 2 seconds.     Findings: Wound present.       Neurological:     Mental Status: He is alert and oriented to person, place, and time.     Deep Tendon Reflexes: Reflexes are normal and symmetric.  Psychiatric:        Mood and  Affect: Mood normal.        Behavior: Behavior normal. Behavior is cooperative.        Thought Content: Thought content normal.        Judgment: Judgment normal.     Results for orders placed or performed in visit on 11/01/18  Bayer DCA Hb A1c Waived  Result Value Ref Range   HB A1C (BAYER DCA - WAIVED) 7.4 (H) <7.0 %  Microalbumin, Urine Waived  Result Value Ref Range   Microalb, Ur Waived 30 (H) 0 - 19 mg/L   Creatinine, Urine Waived 300 10 - 300 mg/dL   Microalb/Creat Ratio <30 <30 mg/g  Comp Met (CMET)  Result Value Ref Range   Glucose 155 (H) 65 - 99 mg/dL   BUN 25 8 - 27 mg/dL   Creatinine, Ser 1.47 (H) 0.76 - 1.27 mg/dL   GFR calc non Af Amer 47 (L) >59 mL/min/1.73   GFR calc Af Amer 54 (L) >59 mL/min/1.73   BUN/Creatinine Ratio 17 10 - 24   Sodium 138 134 - 144 mmol/L   Potassium 5.2 3.5 - 5.2 mmol/L   Chloride 102 96 - 106 mmol/L   CO2 21 20 - 29 mmol/L   Calcium 10.1 8.6 - 10.2 mg/dL   Total Protein 6.9 6.0 - 8.5 g/dL   Albumin 4.8 (H) 3.7 - 4.7 g/dL   Globulin, Total 2.1 1.5 - 4.5 g/dL   Albumin/Globulin Ratio 2.3 (H) 1.2 - 2.2   Bilirubin Total 0.8 0.0 - 1.2 mg/dL   Alkaline Phosphatase 94 39 - 117 IU/L   AST 19 0 - 40 IU/L   ALT 16 0 - 44 IU/L      Assessment & Plan:   Problem List Items Addressed This Visit      Other   Wound infection    Dog bite to right dorsal hand with full ROM of hand.  Up to date on tetanus.  Script for Augmentin and Mupirocin sent.  Continue to monitor area for increased s/s infection and alert provider if present.  Return in 1 one week for wound check.      Relevant Medications   mupirocin ointment (BACTROBAN) 2 %       Follow up plan: Return in about 1 week (around 11/15/2018) for  follow-up.

## 2018-11-10 DIAGNOSIS — I483 Typical atrial flutter: Secondary | ICD-10-CM | POA: Diagnosis not present

## 2018-11-10 DIAGNOSIS — Z9889 Other specified postprocedural states: Secondary | ICD-10-CM | POA: Diagnosis not present

## 2018-11-10 DIAGNOSIS — I480 Paroxysmal atrial fibrillation: Secondary | ICD-10-CM | POA: Diagnosis not present

## 2018-11-10 DIAGNOSIS — Z8679 Personal history of other diseases of the circulatory system: Secondary | ICD-10-CM | POA: Diagnosis not present

## 2018-11-15 ENCOUNTER — Ambulatory Visit: Payer: Medicare Other | Admitting: Nurse Practitioner

## 2018-12-18 DIAGNOSIS — Z23 Encounter for immunization: Secondary | ICD-10-CM | POA: Diagnosis not present

## 2018-12-27 ENCOUNTER — Ambulatory Visit (INDEPENDENT_AMBULATORY_CARE_PROVIDER_SITE_OTHER): Payer: Medicare Other | Admitting: Family Medicine

## 2018-12-27 ENCOUNTER — Other Ambulatory Visit: Payer: Self-pay

## 2018-12-27 ENCOUNTER — Encounter: Payer: Self-pay | Admitting: Family Medicine

## 2018-12-27 VITALS — BP 131/68 | HR 84 | Temp 98.4°F | Ht 70.0 in | Wt 215.0 lb

## 2018-12-27 DIAGNOSIS — R21 Rash and other nonspecific skin eruption: Secondary | ICD-10-CM | POA: Diagnosis not present

## 2018-12-27 MED ORDER — TRIAMCINOLONE ACETONIDE 0.1 % EX CREA: 1.0000 "application " | TOPICAL_CREAM | Freq: Two times a day (BID) | CUTANEOUS | 0 refills | Status: DC

## 2018-12-27 MED ORDER — PREDNISONE 10 MG PO TABS: ORAL_TABLET | ORAL | 0 refills | Status: DC

## 2018-12-27 NOTE — Progress Notes (Signed)
BP 131/68 (BP Location: Right Arm, Patient Position: Sitting, Cuff Size: Normal)   Pulse 84   Temp 98.4 F (36.9 C) (Oral)   Ht 5' 10"  (1.778 m)   Wt 215 lb (97.5 kg)   SpO2 97%   BMI 30.85 kg/m    Subjective:    Patient ID: Jonathan [name], male    DOB: 02-Aug-1945, 73 y.o.   MRN: [id number]  HPI: MILLER LIMEHOUSE is a 73 y.o. male  Chief Complaint  Patient presents with  . Rash    Both legs and arms. Ongoing 6 days.    Presenting today with a rash for about 6 days now present on lower legs and upper arms. Seems to have come on after doing a bunch of yardwork in the heat, mowing and weed-eating. Rash feels hot to the touch and itches if he gets to scratching per patient. Has not tried anything for sxs. Denies fevers, chills, body aches, known tick bites, new products or foods. Did not apply any sunscreen, bug spray, or other topicals the day it started.   Relevant past medical, surgical, family and social history reviewed and updated as indicated. Interim medical history since our last visit reviewed. Allergies and medications reviewed and updated.  Review of Systems  Per HPI unless specifically indicated above     Objective:    BP 131/68 (BP Location: Right Arm, Patient Position: Sitting, Cuff Size: Normal)   Pulse 84   Temp 98.4 F (36.9 C) (Oral)   Ht 5' 10"  (1.778 m)   Wt 215 lb (97.5 kg)   SpO2 97%   BMI 30.85 kg/m   Wt Readings from Last 3 Encounters:  12/27/18 215 lb (97.5 kg)  11/08/18 215 lb (97.5 kg)  06/26/18 213 lb (96.6 kg)    Physical Exam Vitals signs and nursing note reviewed.  Constitutional:      Appearance: Normal appearance.  HENT:     Head: Atraumatic.  Eyes:     Extraocular Movements: Extraocular movements intact.     Conjunctiva/sclera: Conjunctivae normal.  Neck:     Musculoskeletal: Normal range of motion and neck supple.  Cardiovascular:     Rate and Rhythm: Normal rate and regular rhythm.  Pulmonary:     Effort: Pulmonary effort  is normal.     Breath sounds: Normal breath sounds.  Musculoskeletal: Normal range of motion.  Skin:    General: Skin is warm and dry.     Findings: Rash (diffuse erythematous papular rash b/l lower legs and upper arms) present.  Neurological:     General: No focal deficit present.     Mental Status: He is oriented to person, place, and time.  Psychiatric:        Mood and Affect: Mood normal.        Thought Content: Thought content normal.        Judgment: Judgment normal.     Results for orders placed or performed in visit on 11/01/18  Bayer DCA Hb A1c Waived  Result Value Ref Range   HB A1C (BAYER DCA - WAIVED) 7.4 (H) <7.0 %  Microalbumin, Urine Waived  Result Value Ref Range   Microalb, Ur Waived 30 (H) 0 - 19 mg/L   Creatinine, Urine Waived 300 10 - 300 mg/dL   Microalb/Creat Ratio <30 <30 mg/g  Comp Met (CMET)  Result Value Ref Range   Glucose 155 (H) 65 - 99 mg/dL   BUN 25 8 - 27 mg/dL  Creatinine, Ser 1.47 (H) 0.76 - 1.27 mg/dL   GFR calc non Af Amer 47 (L) >59 mL/min/1.73   GFR calc Af Amer 54 (L) >59 mL/min/1.73   BUN/Creatinine Ratio 17 10 - 24   Sodium 138 134 - 144 mmol/L   Potassium 5.2 3.5 - 5.2 mmol/L   Chloride 102 96 - 106 mmol/L   CO2 21 20 - 29 mmol/L   Calcium 10.1 8.6 - 10.2 mg/dL   Total Protein 6.9 6.0 - 8.5 g/dL   Albumin 4.8 (H) 3.7 - 4.7 g/dL   Globulin, Total 2.1 1.5 - 4.5 g/dL   Albumin/Globulin Ratio 2.3 (H) 1.2 - 2.2   Bilirubin Total 0.8 0.0 - 1.2 mg/dL   Alkaline Phosphatase 94 39 - 117 IU/L   AST 19 0 - 40 IU/L   ALT 16 0 - 44 IU/L      Assessment & Plan:   Problem List Items Addressed This Visit    None    Visit Diagnoses    Rash    -  Primary   Appears allergic, tx with extended prednisone taper and triamcinolone cream prn. Moisturize skin regularly, avoid irritating products. F/u if not improving       Follow up plan: Return if symptoms worsen or fail to improve.

## 2019-01-03 ENCOUNTER — Encounter: Payer: Self-pay | Admitting: Family Medicine

## 2019-01-03 DIAGNOSIS — Z23 Encounter for immunization: Secondary | ICD-10-CM | POA: Diagnosis not present

## 2019-02-06 ENCOUNTER — Ambulatory Visit (INDEPENDENT_AMBULATORY_CARE_PROVIDER_SITE_OTHER): Payer: Medicare Other | Admitting: Nurse Practitioner

## 2019-02-06 ENCOUNTER — Other Ambulatory Visit: Payer: Self-pay

## 2019-02-06 ENCOUNTER — Encounter: Payer: Self-pay | Admitting: Nurse Practitioner

## 2019-02-06 VITALS — BP 109/74 | HR 90 | Temp 97.8°F | Wt 208.0 lb

## 2019-02-06 DIAGNOSIS — I1310 Hypertensive heart and chronic kidney disease without heart failure, with stage 1 through stage 4 chronic kidney disease, or unspecified chronic kidney disease: Secondary | ICD-10-CM

## 2019-02-06 DIAGNOSIS — E1169 Type 2 diabetes mellitus with other specified complication: Secondary | ICD-10-CM

## 2019-02-06 DIAGNOSIS — E1122 Type 2 diabetes mellitus with diabetic chronic kidney disease: Secondary | ICD-10-CM

## 2019-02-06 DIAGNOSIS — E785 Hyperlipidemia, unspecified: Secondary | ICD-10-CM

## 2019-02-06 DIAGNOSIS — N183 Chronic kidney disease, stage 3 unspecified: Secondary | ICD-10-CM

## 2019-02-06 LAB — BAYER DCA HB A1C WAIVED: HB A1C (BAYER DCA - WAIVED): 7.8 % — ABNORMAL HIGH (ref <7.0)

## 2019-02-06 NOTE — Progress Notes (Signed)
BP 109/74   Pulse 90   Temp 97.8 F (36.6 C) (Oral)   Wt 208 lb (94.3 kg)   SpO2 97%   BMI 29.84 kg/m    Subjective:    Patient ID: Jonathan [name], male    DOB: 16-Nov-1945, 73 y.o.   MRN: [id number]  HPI: Jonathan [name] is a 73 y.o. male  Chief Complaint  Patient presents with  . Diabetes  . Hyperlipidemia  . Hypertension   DIABETES Last A1C was 7.4%.  Januvia 50 MG daily, renal dosed. Continues on Trulicity 1.5 MG weekly and Metformin 500 MG BID.  Reports he has been trying to lose weight and focused on diet.  Has lost 7 pounds in past month. Hypoglycemic episodes:no Polydipsia/polyuria: no Visual disturbance: no Chest pain: no Paresthesias: no Glucose Monitoring: yes  Accucheck frequency: weekly  Fasting glucose: sometimes 180-190 in morning  Post prandial:  Evening:  Before meals: Taking Insulin?: no  Long acting insulin:  Short acting insulin: Blood Pressure Monitoring: daily Retinal Examination: not up to date Foot Exam: Up to Date Pneumovax: Up to Date Influenza: Up to Date Aspirin: no   HYPERTENSION / HYPERLIPIDEMIA WITH CKD Continues on Lisinopril 20 MG, Metoprolol 50 MG BID, and Atorvastatin 80 MG.  He goes to nephrology, but has been stable with kidney function for the past year in CKD 3.  Sees Dr. Nehemiah Massed for cardiology, last saw 11/10/18.   Satisfied with current treatment? yes Duration of hypertension: chronic BP monitoring frequency: daily BP range: 100-110/60-70 BP medication side effects: no Duration of hyperlipidemia: chronic Cholesterol medication side effects: no Cholesterol supplements: none Medication compliance: good compliance Aspirin: no Recent stressors: no Recurrent headaches: no Visual changes: no Palpitations: no Dyspnea: no Chest pain: no Lower extremity edema: no Dizzy/lightheaded: no  Relevant past medical, surgical, family and social history reviewed and updated as indicated. Interim medical history since our last  visit reviewed. Allergies and medications reviewed and updated.  Review of Systems  Constitutional: Negative for activity change, diaphoresis, fatigue and fever.  Respiratory: Negative for cough, chest tightness, shortness of breath and wheezing.   Cardiovascular: Negative for chest pain, palpitations and leg swelling.  Gastrointestinal: Negative for abdominal distention, abdominal pain, constipation, diarrhea, nausea and vomiting.  Endocrine: Negative for cold intolerance, heat intolerance, polydipsia, polyphagia and polyuria.  Neurological: Negative for dizziness, syncope, weakness, light-headedness, numbness and headaches.  Psychiatric/Behavioral: Negative.     Per HPI unless specifically indicated above     Objective:    BP 109/74   Pulse 90   Temp 97.8 F (36.6 C) (Oral)   Wt 208 lb (94.3 kg)   SpO2 97%   BMI 29.84 kg/m   Wt Readings from Last 3 Encounters:  02/06/19 208 lb (94.3 kg)  12/27/18 215 lb (97.5 kg)  11/08/18 215 lb (97.5 kg)    Physical Exam Vitals signs and nursing note reviewed.  Constitutional:      General: He is awake. He is not in acute distress.    Appearance: He is well-developed. He is not ill-appearing.  HENT:     Head: Normocephalic and atraumatic.     Right Ear: Hearing normal. No drainage.     Left Ear: Hearing normal. No drainage.  Eyes:     General: Lids are normal.        Right eye: No discharge.        Left eye: No discharge.     Conjunctiva/sclera: Conjunctivae normal.     Pupils:  Pupils are equal, round, and reactive to light.  Neck:     Musculoskeletal: Normal range of motion and neck supple.     Thyroid: No thyromegaly.     Vascular: No carotid bruit.  Cardiovascular:     Rate and Rhythm: Normal rate and regular rhythm.     Heart sounds: Normal heart sounds, S1 normal and S2 normal. No murmur. No gallop.   Pulmonary:     Effort: Pulmonary effort is normal. No accessory muscle usage or respiratory distress.     Breath sounds:  Normal breath sounds.  Abdominal:     General: Bowel sounds are normal.     Palpations: Abdomen is soft.  Musculoskeletal: Normal range of motion.     Right lower leg: No edema.     Left lower leg: No edema.  Skin:    General: Skin is warm and dry.  Neurological:     Mental Status: He is alert and oriented to person, place, and time.     Deep Tendon Reflexes: Reflexes are normal and symmetric.  Psychiatric:        Mood and Affect: Mood normal.        Behavior: Behavior normal. Behavior is cooperative.        Thought Content: Thought content normal.        Judgment: Judgment normal.     Results for orders placed or performed in visit on 11/01/18  Bayer DCA Hb A1c Waived  Result Value Ref Range   HB A1C (BAYER DCA - WAIVED) 7.4 (H) <7.0 %  Microalbumin, Urine Waived  Result Value Ref Range   Microalb, Ur Waived 30 (H) 0 - 19 mg/L   Creatinine, Urine Waived 300 10 - 300 mg/dL   Microalb/Creat Ratio <30 <30 mg/g  Comp Met (CMET)  Result Value Ref Range   Glucose 155 (H) 65 - 99 mg/dL   BUN 25 8 - 27 mg/dL   Creatinine, Ser 1.47 (H) 0.76 - 1.27 mg/dL   GFR calc non Af Amer 47 (L) >59 mL/min/1.73   GFR calc Af Amer 54 (L) >59 mL/min/1.73   BUN/Creatinine Ratio 17 10 - 24   Sodium 138 134 - 144 mmol/L   Potassium 5.2 3.5 - 5.2 mmol/L   Chloride 102 96 - 106 mmol/L   CO2 21 20 - 29 mmol/L   Calcium 10.1 8.6 - 10.2 mg/dL   Total Protein 6.9 6.0 - 8.5 g/dL   Albumin 4.8 (H) 3.7 - 4.7 g/dL   Globulin, Total 2.1 1.5 - 4.5 g/dL   Albumin/Globulin Ratio 2.3 (H) 1.2 - 2.2   Bilirubin Total 0.8 0.0 - 1.2 mg/dL   Alkaline Phosphatase 94 39 - 117 IU/L   AST 19 0 - 40 IU/L   ALT 16 0 - 44 IU/L      Assessment & Plan:   Problem List Items Addressed This Visit      Cardiovascular and Mediastinum   Hypertensive heart/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  Continue Lisinopril for kidney protection.  Continue current medication regimen and  collaboration with cardiology and nephrology.  BP at goal.  Return in 3 months.      Relevant Orders   Comprehensive metabolic panel     Endocrine   Hyperlipidemia associated with type 2 diabetes mellitus (HCC)    Chronic, ongoing.  Continue with current regimen and adjust as needed based on labs.  Lipid panel and CMP today.  Relevant Orders   Lipid Panel w/o Chol/HDL Ratio   Type 2 diabetes mellitus with chronic kidney disease, without long-term current use of insulin (HCC) - Primary    Chronic, ongoing.  A1C slightly increased today at 7.8%.  Continue current medication regimen (renal dosed) and he wishes to focus more on diet over next few months as he is losing weight.  If continue elevations may need to consider Glipizide and d/c Januvia.  Renal function limits medications that can be used.  Recommend he check blood sugar at least once a day in morning and document these for provider.  Provided information to him on CCM team, he wishes to wait on referral.  Return in 3 months.      Relevant Orders   Comprehensive metabolic panel   Bayer DCA Hb A1c Waived     Genitourinary   CKD (chronic kidney disease), stage III ([place])    Continue to collaborate with nephrology and renal dose medication.  CMP today.      Relevant Orders   Comprehensive metabolic panel       Follow up plan: Return in about 3 months (around 05/08/2019) for T2DM, HTN/HLD, CKD, Hypothyroid.

## 2019-02-06 NOTE — Patient Instructions (Signed)

## 2019-02-06 NOTE — Assessment & Plan Note (Signed)
Chronic, ongoing.  Continue with current regimen and adjust as needed based on labs.  Lipid panel and CMP today.

## 2019-02-06 NOTE — Assessment & Plan Note (Signed)
Chronic, ongoing.  A1C slightly increased today at 7.8%.  Continue current medication regimen (renal dosed) and he wishes to focus more on diet over next few months as he is losing weight.  If continue elevations may need to consider Glipizide and d/c Januvia.  Renal function limits medications that can be used.  Recommend he check blood sugar at least once a day in morning and document these for provider.  Provided information to him on CCM team, he wishes to wait on referral.  Return in 3 months.

## 2019-02-06 NOTE — Assessment & Plan Note (Signed)
Chronic, ongoing.  Continue Lisinopril for kidney protection.  Continue current medication regimen and collaboration with cardiology and nephrology.  BP at goal.  Return in 3 months.

## 2019-02-06 NOTE — Assessment & Plan Note (Signed)
Continue to collaborate with nephrology and renal dose medication.  CMP today.

## 2019-02-07 LAB — LIPID PANEL W/O CHOL/HDL RATIO
Cholesterol, Total: 152 mg/dL (ref 100–199)
HDL: 48 mg/dL (ref >39)
LDL Chol Calc (NIH): 78 mg/dL (ref 0–99)
Triglycerides: 147 mg/dL (ref 0–149)
VLDL Cholesterol Cal: 26 mg/dL (ref 5–40)

## 2019-02-07 LAB — COMPREHENSIVE METABOLIC PANEL
ALT: 13 IU/L (ref 0–44)
AST: 16 IU/L (ref 0–40)
Albumin/Globulin Ratio: 2.5 — ABNORMAL HIGH (ref 1.2–2.2)
Albumin: 4.8 g/dL — ABNORMAL HIGH (ref 3.7–4.7)
Alkaline Phosphatase: 81 IU/L (ref 39–117)
BUN/Creatinine Ratio: 15 (ref 10–24)
BUN: 25 mg/dL (ref 8–27)
Bilirubin Total: 0.6 mg/dL (ref 0.0–1.2)
CO2: 21 mmol/L (ref 20–29)
Calcium: 9.9 mg/dL (ref 8.6–10.2)
Chloride: 105 mmol/L (ref 96–106)
Creatinine, Ser: 1.7 mg/dL — ABNORMAL HIGH (ref 0.76–1.27)
GFR calc Af Amer: 45 mL/min/{1.73_m2} — ABNORMAL LOW (ref >59)
GFR calc non Af Amer: 39 mL/min/{1.73_m2} — ABNORMAL LOW (ref >59)
Globulin, Total: 1.9 g/dL (ref 1.5–4.5)
Glucose: 133 mg/dL — ABNORMAL HIGH (ref 65–99)
Potassium: 5.6 mmol/L — ABNORMAL HIGH (ref 3.5–5.2)
Sodium: 140 mmol/L (ref 134–144)
Total Protein: 6.7 g/dL (ref 6.0–8.5)

## 2019-02-13 DIAGNOSIS — E119 Type 2 diabetes mellitus without complications: Secondary | ICD-10-CM | POA: Diagnosis not present

## 2019-02-13 DIAGNOSIS — I10 Essential (primary) hypertension: Secondary | ICD-10-CM | POA: Diagnosis not present

## 2019-02-13 DIAGNOSIS — I480 Paroxysmal atrial fibrillation: Secondary | ICD-10-CM | POA: Diagnosis not present

## 2019-02-13 DIAGNOSIS — E782 Mixed hyperlipidemia: Secondary | ICD-10-CM | POA: Diagnosis not present

## 2019-02-13 DIAGNOSIS — I6523 Occlusion and stenosis of bilateral carotid arteries: Secondary | ICD-10-CM | POA: Diagnosis not present

## 2019-02-26 DIAGNOSIS — I6523 Occlusion and stenosis of bilateral carotid arteries: Secondary | ICD-10-CM | POA: Diagnosis not present

## 2019-02-26 DIAGNOSIS — I480 Paroxysmal atrial fibrillation: Secondary | ICD-10-CM | POA: Diagnosis not present

## 2019-03-05 DIAGNOSIS — I10 Essential (primary) hypertension: Secondary | ICD-10-CM | POA: Diagnosis not present

## 2019-03-05 DIAGNOSIS — I6523 Occlusion and stenosis of bilateral carotid arteries: Secondary | ICD-10-CM | POA: Diagnosis not present

## 2019-03-05 DIAGNOSIS — Z9889 Other specified postprocedural states: Secondary | ICD-10-CM | POA: Diagnosis not present

## 2019-03-05 DIAGNOSIS — I480 Paroxysmal atrial fibrillation: Secondary | ICD-10-CM | POA: Diagnosis not present

## 2019-03-05 DIAGNOSIS — E119 Type 2 diabetes mellitus without complications: Secondary | ICD-10-CM | POA: Diagnosis not present

## 2019-03-05 DIAGNOSIS — Z8679 Personal history of other diseases of the circulatory system: Secondary | ICD-10-CM | POA: Diagnosis not present

## 2019-03-05 DIAGNOSIS — E782 Mixed hyperlipidemia: Secondary | ICD-10-CM | POA: Diagnosis not present

## 2019-03-05 DIAGNOSIS — I483 Typical atrial flutter: Secondary | ICD-10-CM | POA: Diagnosis not present

## 2019-04-02 ENCOUNTER — Other Ambulatory Visit: Payer: Self-pay | Admitting: Nurse Practitioner

## 2019-05-12 ENCOUNTER — Other Ambulatory Visit: Payer: Self-pay | Admitting: Nurse Practitioner

## 2019-05-21 ENCOUNTER — Ambulatory Visit (INDEPENDENT_AMBULATORY_CARE_PROVIDER_SITE_OTHER): Payer: Medicare Other

## 2019-05-21 VITALS — BP 117/72 | Ht 70.0 in | Wt 210.0 lb

## 2019-05-21 DIAGNOSIS — Z0000 Encounter for general adult medical examination without abnormal findings: Secondary | ICD-10-CM

## 2019-05-21 NOTE — Patient Instructions (Addendum)
Jonathan [name] , Thank you for taking time to come for your Medicare Wellness Visit. I appreciate your ongoing commitment to your health goals. Please review the following plan we discussed and let me know if I can assist you in the future.   Screening recommendations/referrals: Colonoscopy: up to date  Recommended yearly ophthalmology/optometry visit for glaucoma screening and checkup Recommended yearly dental visit for hygiene and checkup   Vaccinations: Influenza vaccine: up to date  Pneumococcal vaccine: up to date  Tdap vaccine: up to date Shingles vaccine: up to date    Advanced directives: copy on file  Conditions/risks identified: diabetic, discussed chronic care management team   Next appointment: follow up in one year for your annual wellness visit.   Preventive Care 8 Years and Older, Male Preventive care refers to lifestyle choices and visits with your health care provider that can promote health and wellness. What does preventive care include?  A yearly physical exam. This is also called an annual well check.  Dental exams once or twice a year.  Routine eye exams. Ask your health care provider how often you should have your eyes checked.  Personal lifestyle choices, including:  Daily care of your teeth and gums.  Regular physical activity.  Eating a healthy diet.  Avoiding tobacco and drug use.  Limiting alcohol use.  Practicing safe sex.  Taking low doses of aspirin every day.  Taking vitamin and mineral supplements as recommended by your health care provider. What happens during an annual well check? The services and screenings done by your health care provider during your annual well check will depend on your age, overall health, lifestyle risk factors, and family history of disease. Counseling  Your health care provider may ask you questions about your:  Alcohol use.  Tobacco use.  Drug use.  Emotional well-being.  Home and relationship  well-being.  Sexual activity.  Eating habits.  History of falls.  Memory and ability to understand (cognition).  Work and work Statistician. Screening  You may have the following tests or measurements:  Height, weight, and BMI.  Blood pressure.  Lipid and cholesterol levels. These may be checked every 5 years, or more frequently if you are over 68 years old.  Skin check.  Lung cancer screening. You may have this screening every year starting at age 52 if you have a 30-pack-year history of smoking and currently smoke or have quit within the past 15 years.  Fecal occult blood test (FOBT) of the stool. You may have this test every year starting at age 29.  Flexible sigmoidoscopy or colonoscopy. You may have a sigmoidoscopy every 5 years or a colonoscopy every 10 years starting at age 5.  Prostate cancer screening. Recommendations will vary depending on your family history and other risks.  Hepatitis C blood test.  Hepatitis B blood test.  Sexually transmitted disease (STD) testing.  Diabetes screening. This is done by checking your blood sugar (glucose) after you have not eaten for a while (fasting). You may have this done every 1-3 years.  Abdominal aortic aneurysm (AAA) screening. You may need this if you are a current or former smoker.  Osteoporosis. You may be screened starting at age 21 if you are at high risk. Talk with your health care provider about your test results, treatment options, and if necessary, the need for more tests. Vaccines  Your health care provider may recommend certain vaccines, such as:  Influenza vaccine. This is recommended every year.  Tetanus, diphtheria,  and acellular pertussis (Tdap, Td) vaccine. You may need a Td booster every 10 years.  Zoster vaccine. You may need this after age 50.  Pneumococcal 13-valent conjugate (PCV13) vaccine. One dose is recommended after age 24.  Pneumococcal polysaccharide (PPSV23) vaccine. One dose is  recommended after age 37. Talk to your health care provider about which screenings and vaccines you need and how often you need them. This information is not intended to replace advice given to you by your health care provider. Make sure you discuss any questions you have with your health care provider. Document Released: 06/06/2015 Document Revised: 01/28/2016 Document Reviewed: 03/11/2015 Elsevier Interactive Patient Education  2017 [employer] Prevention in the Home Falls can cause injuries. They can happen to people of all ages. There are many things you can do to make your home safe and to help prevent falls. What can I do on the outside of my home?  Regularly fix the edges of walkways and driveways and fix any cracks.  Remove anything that might make you trip as you walk through a door, such as a raised step or threshold.  Trim any bushes or trees on the path to your home.  Use bright outdoor lighting.  Clear any walking paths of anything that might make someone trip, such as rocks or tools.  Regularly check to see if handrails are loose or broken. Make sure that both sides of any steps have handrails.  Any raised decks and porches should have guardrails on the edges.  Have any leaves, snow, or ice cleared regularly.  Use sand or salt on walking paths during winter.  Clean up any spills in your garage right away. This includes oil or grease spills. What can I do in the bathroom?  Use night lights.  Install grab bars by the toilet and in the tub and shower. Do not use towel bars as grab bars.  Use non-skid mats or decals in the tub or shower.  If you need to sit down in the shower, use a plastic, non-slip stool.  Keep the floor dry. Clean up any water that spills on the floor as soon as it happens.  Remove soap buildup in the tub or shower regularly.  Attach bath mats securely with double-sided non-slip rug tape.  Do not have throw rugs and other things on  the floor that can make you trip. What can I do in the bedroom?  Use night lights.  Make sure that you have a light by your bed that is easy to reach.  Do not use any sheets or blankets that are too big for your bed. They should not hang down onto the floor.  Have a firm chair that has side arms. You can use this for support while you get dressed.  Do not have throw rugs and other things on the floor that can make you trip. What can I do in the kitchen?  Clean up any spills right away.  Avoid walking on wet floors.  Keep items that you use a lot in easy-to-reach places.  If you need to reach something above you, use a strong step stool that has a grab bar.  Keep electrical cords out of the way.  Do not use floor polish or wax that makes floors slippery. If you must use wax, use non-skid floor wax.  Do not have throw rugs and other things on the floor that can make you trip. What can I do with my  stairs?  Do not leave any items on the stairs.  Make sure that there are handrails on both sides of the stairs and use them. Fix handrails that are broken or loose. Make sure that handrails are as long as the stairways.  Check any carpeting to make sure that it is firmly attached to the stairs. Fix any carpet that is loose or worn.  Avoid having throw rugs at the top or bottom of the stairs. If you do have throw rugs, attach them to the floor with carpet tape.  Make sure that you have a light switch at the top of the stairs and the bottom of the stairs. If you do not have them, ask someone to add them for you. What else can I do to help prevent falls?  Wear shoes that:  Do not have high heels.  Have rubber bottoms.  Are comfortable and fit you well.  Are closed at the toe. Do not wear sandals.  If you use a stepladder:  Make sure that it is fully opened. Do not climb a closed stepladder.  Make sure that both sides of the stepladder are locked into place.  Ask someone to  hold it for you, if possible.  Clearly mark and make sure that you can see:  Any grab bars or handrails.  First and last steps.  Where the edge of each step is.  Use tools that help you move around (mobility aids) if they are needed. These include:  Canes.  Walkers.  Scooters.  Crutches.  Turn on the lights when you go into a dark area. Replace any light bulbs as soon as they burn out.  Set up your furniture so you have a clear path. Avoid moving your furniture around.  If any of your floors are uneven, fix them.  If there are any pets around you, be aware of where they are.  Review your medicines with your doctor. Some medicines can make you feel dizzy. This can increase your chance of falling. Ask your doctor what other things that you can do to help prevent falls. This information is not intended to replace advice given to you by your health care provider. Make sure you discuss any questions you have with your health care provider. Document Released: 03/06/2009 Document Revised: 10/16/2015 Document Reviewed: 06/14/2014 Elsevier Interactive Patient Education  2017 [employer].

## 2019-05-21 NOTE — Progress Notes (Signed)
Subjective:   Jonathan [name] is a 73 y.o. male who presents for Medicare Annual/Subsequent preventive examination.  This visit is being conducted via phone call  - after an attmept to do on video chat - due to the COVID-19 pandemic. This patient has given me verbal consent via phone to conduct this visit, patient states they are participating from their home address. Some vital signs may be absent or patient reported.   Patient identification: identified by name, DOB, and current address.    Review of Systems:   Cardiac Risk Factors include: advanced age (>86men, >61 women);male gender;dyslipidemia;diabetes mellitus;hypertension     Objective:    Vitals: BP 117/72   Ht  (1.778 m)   Wt 210 lb (95.3 kg)   BMI 30.13 kg/m   Body mass index is 30.13 kg/m.  Advanced Directives 05/21/2019 05/18/2018 05/06/2017 08/13/2016 05/04/2016 02/07/2015  Does Patient Have a Medical Advance Directive? Yes - Yes Yes Yes Yes  Type of Advance Directive Living will;Healthcare Power of [employer] Power of [place];Living will Healthcare Power of [place];Living will Healthcare Power of [place];Living will Healthcare Power of [place];Living will Healthcare Power of [place];Living will  Does patient want to make changes to medical advance directive? - No - Patient declined - - - No - Patient declined  Copy of Healthcare Power of Attorney in Chart? Yes - validated most recent copy scanned in chart (See row information) No - copy requested No - copy requested No - copy requested - No - copy requested    Tobacco Social History   Tobacco Use  Smoking Status Former Smoker  . Packs/day: 1.00  . Years: 35.00  . Pack years: 35.00  . Types: Cigarettes  . Quit date: 03/06/1978  . Years since quitting: 41.2  Smokeless Tobacco Former Neurosurgeon  . Quit date: 03/21/2007     Counseling given: Not Answered   Clinical Intake:  Pre-visit preparation completed: Yes  Pain : No/denies pain      Nutritional Risks: None Diabetes: No  How often do you need to have someone help you when you read instructions, pamphlets, or other written materials from your doctor or pharmacy?: 1 - Never  Nutrition Risk Assessment:  Has the patient had any N/V/D within the last 2 months?  No  Does the patient have any non-healing wounds?  No  Has the patient had any unintentional weight loss or weight gain?  No   Diabetes:  Is the patient diabetic?  Yes  If diabetic, was a CBG obtained today?  No  Did the patient bring in their glucometer from home?  No  How often do you monitor your CBG's?  daily  Financial Strains and Diabetes Management:  Are you having any financial strains with the device, your supplies or your medication? No .  Does the patient want to be seen by Chronic Care Management for management of their diabetes?  No  Would the patient like to be referred to a Nutritionist or for Diabetic Management?  No   Diabetic Exams:  Diabetic Eye Exam: will complete with [facility]ty vision around march   Diabetic Foot Exam: Completed 11/01/2018.   Interpreter Needed?: No  Information entered by :: Ameira Alessandrini,LPN  Past Medical History:  Diagnosis Date  . Asthma   . Diabetes mellitus without complication (HCC)   . Hyperlipidemia   . Hypertension   . Lymphopenia   . Thrombocytopenia (HCC)    Past Surgical History:  Procedure Laterality Date  . heart  ablation    . KNEE SURGERY Bilateral    Family History  Problem Relation Age of Onset  . Hypertension Mother   . Lung cancer Father        lung  . Lung disease Father   . Hypertension Father   . Cancer Sister        breast   Social History   Socioeconomic History  . Marital status: Married    Spouse name: Not on file  . Number of children: Not on file  . Years of education: Not on file  . Highest education level: Not on file  Occupational History  . Not on file  Tobacco Use  . Smoking status: Former Smoker     Packs/day: 1.00    Years: 35.00    Pack years: 35.00    Types: Cigarettes    Quit date: 03/06/1978    Years since quitting: 41.2  . Smokeless tobacco: Former Neurosurgeon    Quit date: 03/21/2007  Substance and Sexual Activity  . Alcohol use: Yes    Alcohol/week: 2.0 standard drinks    Types: 2 Shots of liquor per week    Comment: approx. mixed drink 1-2x week  . Drug use: No  . Sexual activity: Yes  Other Topics Concern  . Not on file  Social History Narrative  . Not on file   Social Determinants of Health   Financial Resource Strain:   . Difficulty of Paying Living Expenses: Not on file  Food Insecurity:   . Worried About Programme researcher, broadcasting/film/video in the Last Year: Not on file  . Ran Out of Food in the Last Year: Not on file  Transportation Needs:   . Lack of Transportation (Medical): Not on file  . Lack of Transportation (Non-Medical): Not on file  Physical Activity:   . Days of Exercise per Week: Not on file  . Minutes of Exercise per Session: Not on file  Stress:   . Feeling of Stress : Not on file  Social Connections:   . Frequency of Communication with Friends and Family: Not on file  . Frequency of Social Gatherings with Friends and Family: Not on file  . Attends Religious Services: Not on file  . Active Member of Clubs or Organizations: Not on file  . Attends Banker Meetings: Not on file  . Marital Status: Not on file    Outpatient Encounter Medications as of 05/21/2019  Medication Sig  . atorvastatin (LIPITOR) 80 MG tablet Take 1 tablet (80 mg total) by mouth daily.  . cholecalciferol (VITAMIN D) 1000 units tablet Take 1,000 Units by mouth daily.  . Dulaglutide (TRULICITY) 1.5 MG/0.5ML SOPN Inject 1.5 mg into the skin once a week.  Marland Kitchen JANUVIA 50 MG tablet TAKE 1 TABLET BY MOUTH EVERY DAY  . levothyroxine (SYNTHROID) 125 MCG tablet Take 1 tablet (125 mcg total) by mouth daily.  Marland Kitchen lisinopril (ZESTRIL) 20 MG tablet Take 1 tablet (20 mg total) by mouth daily.    . magnesium oxide (MAG-OX) 400 MG tablet Take 1 tablet (400 mg total) by mouth daily.  . metFORMIN (GLUCOPHAGE) 500 MG tablet TAKE 1 TABLET (500 MG TOTAL) BY MOUTH 2 (TWO) TIMES DAILY WITH A MEAL.  . metoprolol tartrate (LOPRESSOR) 50 MG tablet TAKE 1 TABLET BY MOUTH TWICE A DAY  . ONE TOUCH ULTRA TEST test strip USE TO CHECK SUGAR TWICE DAILY  . vitamin B-12 (CYANOCOBALAMIN) 1000 MCG tablet Take 1,000 mcg by mouth daily. Every other  day  . [DISCONTINUED] triamcinolone cream (KENALOG) 0.1 % Apply 1 application topically 2 (two) times daily. (Patient not taking: Reported on 02/06/2019)   No facility-administered encounter medications on file as of 05/21/2019.    Activities of Daily Living In your present state of health, do you have any difficulty performing the following activities: 05/21/2019  Hearing? Y  Comment only a little, no hearing aids  Vision? N  Comment reading glasses, patty vision  Difficulty concentrating or making decisions? N  Walking or climbing stairs? N  Dressing or bathing? N  Doing errands, shopping? N  Preparing Food and eating ? N  Using the Toilet? N  In the past six months, have you accidently leaked urine? N  Do you have problems with loss of bowel control? N  Managing your Medications? N  Managing your Finances? N  Housekeeping or managing your Housekeeping? N  Some recent data might be hidden    Patient Care Team: Venita Lick, NP as PCP - General (Nurse Practitioner) Corey Skains, MD as Consulting Physician (Cardiology) Lavonia Dana, MD as Consulting Physician (Nephrology)   Assessment:   This is a routine wellness examination for Gen.  Exercise Activities and Dietary recommendations Current Exercise Habits: The patient does not participate in regular exercise at present(work outside), Exercise limited by: None identified  Goals    . DIET - INCREASE WATER INTAKE     Recommend drinking at least 6-8 glasses of water a day         Fall Risk Fall Risk  05/21/2019 11/08/2018 05/18/2018 04/07/2018 05/06/2017  Falls in the past year? 0 0 1 1 No  Comment - - - Emmi Telephone Survey: data to providers prior to load -  Number falls in past yr: 0 0 0 1 -  Comment - - - Emmi Telephone Survey Actual Response = 9 -  Injury with Fall? 0 0 0 0 -  Follow up - Falls evaluation completed - - -   FALL RISK PREVENTION PERTAINING TO THE HOME:  Any stairs in or around the home? Yes  If so, are there any without handrails? No   Home free of loose throw rugs in walkways, pet beds, electrical cords, etc? Yes  Adequate lighting in your home to reduce risk of falls? Yes   ASSISTIVE DEVICES UTILIZED TO PREVENT FALLS:  Life alert? No  Use of a cane, walker or w/c? No  Grab bars in the bathroom? No  Shower chair or bench in shower? No  Elevated toilet seat or a handicapped toilet? No    TIMED UP AND GO:  Unable to perform    Depression Screen PHQ 2/9 Scores 05/21/2019 05/18/2018 05/06/2017 05/04/2016  PHQ - 2 Score 0 0 0 2    Cognitive Function     6CIT Screen 05/18/2018 05/06/2017  What Year? 0 points 0 points  What month? 0 points 0 points  What time? 0 points 0 points  Count back from 20 0 points 0 points  Months in reverse 0 points 0 points  Repeat phrase 0 points 0 points  Total Score 0 0    Immunization History  Administered Date(s) Administered  . Influenza, High Dose Seasonal PF 01/17/2016, 01/16/2017, 01/03/2019  . Influenza-Unspecified 02/17/2015, 01/06/2018  . Pneumococcal Conjugate-13 09/18/2013, 10/10/2017  . Pneumococcal Polysaccharide-23 05/21/2014, 12/18/2018  . Td 03/27/2007  . Tdap 09/18/2013, 12/14/2018  . Zoster 03/15/2012  . Zoster Recombinat (Shingrix) 09/17/2017, 01/06/2018    Qualifies for Shingles Vaccine? Shingrix completed  Tdap: up to date .  Flu Vaccine: up to date   Pneumococcal Vaccine: up to date   Screening Tests Health Maintenance  Topic Date Due  .  OPHTHALMOLOGY EXAM  08/31/2018  . HEMOGLOBIN A1C  08/06/2019  . FOOT EXAM  11/01/2019  . Fecal DNA (Cologuard)  05/31/2020  . TETANUS/TDAP  12/13/2028  . INFLUENZA VACCINE  Completed  . Hepatitis C Screening  Completed  . PNA vac Low Risk Adult  Completed   Cancer Screenings:  Colorectal Screening: Completed cologuard 05/31/2017  Lung Cancer Screening: (Low Dose CT Chest recommended if Age 36-80 years, 30 pack-year currently smoking OR have quit w/in 15years.) does not qualify.    Additional Screening:  Hepatitis C Screening: does qualify; Completed 09/02/2014  Dental Screening: Recommended annual dental exams for proper oral hygiene  Community Resource Referral:  CRR required this visit?  No        Plan:  I have personally reviewed and addressed the Medicare Annual Wellness questionnaire and have noted the following in the patient's chart:  A. Medical and social history B. Use of alcohol, tobacco or illicit drugs  C. Current medications and supplements D. Functional ability and status E.  Nutritional status F.  Physical activity G. Advance directives H. List of other physicians I.  Hospitalizations, surgeries, and ER visits in previous 12 months J.  Vitals K. Screenings such as hearing and vision if needed, cognitive and depression L. Referrals and appointments   In addition, I have reviewed and discussed with patient certain preventive protocols, quality metrics, and best practice recommendations. A written personalized care plan for preventive services as well as general preventive health recommendations were provided to patient.   Signed,   Collene SchlichterHill, Rane Blitch A, LPN  16/10/960412/28/2020 Nurse Health Advisor  Nurse Notes: none

## 2019-05-26 ENCOUNTER — Other Ambulatory Visit: Payer: Self-pay | Admitting: Family Medicine

## 2019-05-28 ENCOUNTER — Ambulatory Visit: Payer: Self-pay | Admitting: *Deleted

## 2019-05-28 NOTE — Telephone Encounter (Signed)
Patient had exposure to COVID- he is about 9-10 days out and still having symptoms. He wants to know if he should get tested.  Patient has been advised of testing site and how to make appointment. Patient is high risk due to health conditions- but symptoms are manageable now. Call to office for virtual visit- request for note for PCP. **Patient does have appointment next Monday that needs to be addressed if result comes back + COVID.  Reason for Disposition . [1] HIGH RISK patient (e.g., age > 64 years, diabetes, heart or lung disease, weak immune system) AND [2] new or worsening symptoms  Answer Assessment - Initial Assessment Questions 1. COVID-19 CLOSE CONTACT: "Who is the person with the confirmed or suspected COVID-19 infection that you were exposed to?"     Family member 2. PLACE of CONTACT: "Where were you when you were exposed to COVID-19?" (e.g., home, school, medical waiting room; which city?)     Visit at the home 3. TYPE of CONTACT: "How much contact was there?" (e.g., sitting next to, live in same house, work in same office, same building)     Visiting in home 4. DURATION of CONTACT: "How long were you in contact with the COVID-19 patient?" (e.g., a few seconds, passed by person, a few minutes, 15 minutes or longer, live with the patient)     2 hours 5. MASK: "Were you wearing a mask?" "Was the other person wearing a mask?" Note: wearing a mask reduces the risk of an  otherwise close contact.     no 6. DATE of CONTACT: "When did you have contact with a COVID-19 patient?" (e.g., how many days ago)     12/24 7. COMMUNITY SPREAD: "Are there lots of cases of COVID-19 (community spread) where you live?" (See public health department website, if unsure)       Community spread 8. SYMPTOMS: "Do you have any symptoms?" (e.g., fever, cough, breathing difficulty, loss of taste or smell)     Saturday- started with fatigue, chills,diarrhea  9. PREGNANCY OR POSTPARTUM: "Is there any chance  you are pregnant?" "When was your last menstrual period?" "Did you deliver in the last 2 weeks?"     n/a 10. HIGH RISK: "Do you have any heart or lung problems? Do you have a weak immune system?" (e.g., heart failure, COPD, asthma, HIV positive, chemotherapy, renal failure, diabetes mellitus, sickle cell anemia, obesity)       Age, diabetic, heart ablation history 11.  TRAVEL: "Have you traveled out of the country recently?" If so, "When and where?"  Also ask about out-of-state travel, since the CDC has identified some high-risk cities for community spread in the [place].  Note: Travel becomes less relevant if there is widespread community transmission where the patient lives.       Exposure  Protocols used: CORONAVIRUS (COVID-19) DIAGNOSED OR SUSPECTED-A-AH, CORONAVIRUS (COVID-19) EXPOSURE-A-AH

## 2019-05-28 NOTE — Telephone Encounter (Signed)
He can schedule Covid testing this week and if able to try to fit him in for virtual if opening this week.  If not then may need to keep Monday appt

## 2019-05-29 ENCOUNTER — Ambulatory Visit: Payer: Medicare Other | Attending: Internal Medicine

## 2019-05-29 DIAGNOSIS — Z20822 Contact with and (suspected) exposure to covid-19: Secondary | ICD-10-CM

## 2019-05-31 ENCOUNTER — Encounter: Payer: Self-pay | Admitting: Nurse Practitioner

## 2019-05-31 ENCOUNTER — Ambulatory Visit (INDEPENDENT_AMBULATORY_CARE_PROVIDER_SITE_OTHER): Payer: Medicare Other | Admitting: Nurse Practitioner

## 2019-05-31 ENCOUNTER — Other Ambulatory Visit: Payer: Self-pay

## 2019-05-31 ENCOUNTER — Other Ambulatory Visit: Payer: Self-pay | Admitting: Nurse Practitioner

## 2019-05-31 VITALS — BP 125/69 | Temp 98.0°F

## 2019-05-31 DIAGNOSIS — J069 Acute upper respiratory infection, unspecified: Secondary | ICD-10-CM | POA: Diagnosis not present

## 2019-05-31 LAB — NOVEL CORONAVIRUS, NAA: SARS-CoV-2, NAA: DETECTED — AB

## 2019-05-31 MED ORDER — TRULICITY 1.5 MG/0.5ML ~~LOC~~ SOAJ: 1.5000 mg | SUBCUTANEOUS | 3 refills | Status: DC

## 2019-05-31 NOTE — Patient Instructions (Signed)
10 Things You Can Do to Manage Your COVID-19 Symptoms at Home If you have possible or confirmed COVID-19: 1. Stay home from work and school. And stay away from other public places. If you must go out, avoid using any kind of public transportation, ridesharing, or taxis. 2. Monitor your symptoms carefully. If your symptoms get worse, call your healthcare provider immediately. 3. Get rest and stay hydrated. 4. If you have a medical appointment, call the healthcare provider ahead of time and tell them that you have or may have COVID-19. 5. For medical emergencies, call 911 and notify the dispatch personnel that you have or may have COVID-19. 6. Cover your cough and sneezes with a tissue or use the inside of your elbow. 7. Wash your hands often with soap and water for at least 20 seconds or clean your hands with an alcohol-based hand sanitizer that contains at least 60% alcohol. 8. As much as possible, stay in a specific room and away from other people in your home. Also, you should use a separate bathroom, if available. If you need to be around other people in or outside of the home, wear a mask. 9. Avoid sharing personal items with other people in your household, like dishes, towels, and bedding. 10. Clean all surfaces that are touched often, like counters, tabletops, and doorknobs. Use household cleaning sprays or wipes according to the label instructions. [email or web address] 11/22/2018 This information is not intended to replace advice given to you by your health care provider. Make sure you discuss any questions you have with your health care provider. Document Revised: 04/26/2019 Document Reviewed: 04/26/2019 Elsevier Patient Education  2020 [employer]. 3 Key Steps to Take While Waiting for Your COVID-19 Test Result To help stop the spread of COVID-19, take these 3 key steps NOW while waiting for your test results: 1. Stay home and monitor your health. Stay home and monitor your health to  help protect your friends, family, and others from possibly getting COVID-19 from you. Stay home and away from others:  If possible, stay away from others, especially people who are at higher risk for getting very sick from COVID-19, such as older adults and people with other medical conditions.  If you have been in contact with someone with COVID-19, stay home and away from others for 14 days after your last contact with that person.  If you have a fever, cough or other symptoms of COVID-19, stay home and away from others (except to get medical care). Monitor your health:  Watch for fever, cough, shortness of breath, or other symptoms of COVID-19. Remember, symptoms may appear 2-14 days after exposure to COVID-19 and can include: ? Fever or chills ? Cough ? Shortness of breath or difficulty breathing ? Tiredness ? Muscle or body aches ? Headache ? New loss of taste or smell ? Sore throat ? Congestion or runny nose ? Nausea or vomiting ? Diarrhea 2. Think about the people you have recently been around. If you are diagnosed with COVID-19, a public health worker may call you to check on your health, discuss who you have been around, and ask where you spent time while you may have been able to spread COVID-19 to others. While you wait for your COVID-19 test result, think about everyone you have been around recently. This will be important information to give health workers if your test is positive.  Complete the information on the back of this page to help you remember everyone you have been  around. 3. Answer the phone call from the health department. If a public health worker calls you, answer the call to help slow the spread of COVID-19 in your community.  Discussions with health department staff are confidential. This means that your personal and medical information will be kept private and only shared with those who may need to know, like your health care provider.  Your name will not  be shared with those you came in contact with. The health department will only notify people you were in close contact with (within 6 feet for more than 15 minutes) that they might have been exposed to COVID-19. Think about the people you have recently been around If you test positive and are diagnosed with COVID-19, someone from the health department may call to check-in on your health, discuss who you have been around, and ask where you spent time while you may have been able to spread COVID-19 to others. This form can help you think about people you have recently been around so you will be ready if a public health worker calls you. Things to think about. Have you:  Gone to work or school?  Gotten together with others (eaten out at a restaurant, gone out for drinks, exercised with others or gone to a gym, had friends or family over to your house, volunteered, gone to a party, pool, or park)?  Gone to a store in person (e.g., grocery store, mall)?  Gone to in-person appointments (e.g., salon, barber, doctor's or dentist's office)?  Ridden in a car with others (e.g., Uber or Lyft) or took public transportation?  Been inside a church, synagogue, mosque or other places of worship? Who lives with you?  ______________________________________________________________________  ______________________________________________________________________  ______________________________________________________________________  ______________________________________________________________________ Who have you been around (within 6 feet for more than 15 minutes) in the last 10 days? (You may have more people to list than the space provided. If so, write on the front of this sheet or a separate piece of paper.) Name ______________________________________________  Phone number ____________________________________  Date you last saw them _____________________________  Where you last saw them  ________________________________________________ Name ______________________________________________  Phone number ____________________________________  Date you last saw them _____________________________  Where you last saw them ________________________________________________ Name ______________________________________________  Phone number ____________________________________  Date you last saw them _____________________________  Where you last saw them ________________________________________________ Name ______________________________________________  Phone number ____________________________________  Date you last saw them _____________________________  Where you last saw them ________________________________________________ Name ______________________________________________  Phone number ____________________________________  Date you last saw them _____________________________  Where you last saw them ________________________________________________ What have you done in the last 10 days with other people? Activity _____________________________________________  Location _________________________________________  Date ____________________________________________ Activity _____________________________________________  Location _________________________________________  Date ____________________________________________ Activity _____________________________________________  Location _________________________________________  Date ____________________________________________ Activity _____________________________________________  Location _________________________________________  Date ____________________________________________ Activity _____________________________________________  Location _________________________________________  Date ____________________________________________ cdc.gov/coronavirus 12/18/2018 This information is not intended to  replace advice given to you by your health care provider. Make sure you discuss any questions you have with your health care provider. Document Revised: 04/26/2019 Document Reviewed: 04/26/2019 Elsevier Patient Education  2020 [employer] Inc[employer]  

## 2019-05-31 NOTE — Assessment & Plan Note (Addendum)
Acute, symptoms improving. COVID-19 test pending. Symptomatic management with antipyretics and antitussives. Discussed projected length of acute disease for mild illness. Encouraged hydration, rest, and symptomatic management. Regular f/u scheduled with PCP next week. Call back to clinic if symptoms worsen. Go to ED with SOB or chest pain.

## 2019-05-31 NOTE — Progress Notes (Signed)
BP 125/69   Temp 98 F (36.7 C)    Subjective:    Patient ID: Jonathan [name], male    DOB: 1946-02-21, 74 y.o.   MRN: [id number]  HPI: Jonathan [name] is a 74 y.o. male  Chief Complaint  Patient presents with  . Other    [phone] - Sx started 05/19/2019,  tested on 05/29/2019    UPPER RESPIRATORY TRACT INFECTION Symptom onset:  12/26 with pain, shivers, & fatigue; covid test pending; pretty sure + because family members diagnosed positive Worst symptom:  Fatigue, body aches, cough, all improving Fever: no ; chills and the shivers, sweating  Cough: yes; productive Shortness of breath: no Wheezing: no Chest pain: no Chest tightness: no Chest congestion: yes Nasal congestion: yes Runny nose: no Post nasal drip: yes Sneezing: no Sore throat: no Swollen glands: no Sinus pressure: no Headache: yes Face pain: no Toothache: no Ear pain: no  Ear pressure: no  Eyes red/itching:no  Eye drainage/crusting: no  Loss of smell/taste: Nausea/Vomiting: no  Diarrhea: no Rash: no Fatigue: no Sick contacts: yes Strep contacts: no  Context: better Recurrent sinusitis: no Relief with OTC cold/cough medications: has not tried OTC  Treatments attempted: none    Relevant past medical, surgical, family and social history reviewed and updated as indicated. Interim medical history since our last visit reviewed. Allergies and medications reviewed and updated.  Review of Systems  Constitutional: Positive for fatigue. Negative for chills, diaphoresis and fever.  HENT: Positive for congestion. Negative for ear discharge, ear pain, facial swelling, postnasal drip, rhinorrhea, sinus pressure, sinus pain, sneezing and sore throat.   Eyes: Negative.  Negative for pain, discharge, redness and itching.  Respiratory: Positive for cough (productive). Negative for chest tightness, shortness of breath and wheezing.   Cardiovascular: Negative.  Negative for chest pain.  Gastrointestinal: Negative.   Negative for diarrhea, nausea and vomiting.  Musculoskeletal: Positive for myalgias.  Skin: Negative.  Negative for color change and rash.  Neurological: Positive for headaches. Negative for dizziness, weakness and light-headedness.  Hematological: Negative for adenopathy.  Psychiatric/Behavioral: Negative for confusion.    Per HPI unless specifically indicated above     Objective:    BP 125/69   Temp 98 F (36.7 C)   Wt Readings from Last 3 Encounters:  05/21/19 210 lb (95.3 kg)  02/06/19 208 lb (94.3 kg)  12/27/18 215 lb (97.5 kg)    Physical Exam Vitals and nursing note reviewed.  Constitutional:      General: He is not in acute distress.    Appearance: Normal appearance. He is normal weight. He is not ill-appearing, toxic-appearing or diaphoretic.  HENT:     Head: Normocephalic.     Right Ear: External ear normal.     Left Ear: External ear normal.     Nose: Congestion present.     Mouth/Throat:     Mouth: Mucous membranes are moist.     Pharynx: Oropharynx is clear. No posterior oropharyngeal erythema.  Eyes:     General: No scleral icterus.    Conjunctiva/sclera: Conjunctivae normal.     Pupils: Pupils are equal, round, and reactive to light.  Pulmonary:     Effort: Pulmonary effort is normal. No respiratory distress.  Musculoskeletal:     Cervical back: Normal range of motion. No rigidity.  Skin:    General: Skin is warm and dry.     Coloration: Skin is not jaundiced or pale.     Findings: No erythema.  Neurological:     General: No focal deficit present.     Mental Status: He is alert and oriented to person, place, and time.     Motor: No weakness.  Psychiatric:        Mood and Affect: Mood normal.        Behavior: Behavior normal.        Thought Content: Thought content normal.        Judgment: Judgment normal.     Results for orders placed or performed in visit on 02/06/19  Comprehensive metabolic panel  Result Value Ref Range   Glucose 133 (H)  65 - 99 mg/dL   BUN 25 8 - 27 mg/dL   Creatinine, Ser 1.70 (H) 0.76 - 1.27 mg/dL   GFR calc non Af Amer 39 (L) >59 mL/min/1.73   GFR calc Af Amer 45 (L) >59 mL/min/1.73   BUN/Creatinine Ratio 15 10 - 24   Sodium 140 134 - 144 mmol/L   Potassium 5.6 (H) 3.5 - 5.2 mmol/L   Chloride 105 96 - 106 mmol/L   CO2 21 20 - 29 mmol/L   Calcium 9.9 8.6 - 10.2 mg/dL   Total Protein 6.7 6.0 - 8.5 g/dL   Albumin 4.8 (H) 3.7 - 4.7 g/dL   Globulin, Total 1.9 1.5 - 4.5 g/dL   Albumin/Globulin Ratio 2.5 (H) 1.2 - 2.2   Bilirubin Total 0.6 0.0 - 1.2 mg/dL   Alkaline Phosphatase 81 39 - 117 IU/L   AST 16 0 - 40 IU/L   ALT 13 0 - 44 IU/L  Bayer DCA Hb A1c Waived  Result Value Ref Range   HB A1C (BAYER DCA - WAIVED) 7.8 (H) <7.0 %  Lipid Panel w/o Chol/HDL Ratio  Result Value Ref Range   Cholesterol, Total 152 100 - 199 mg/dL   Triglycerides 147 0 - 149 mg/dL   HDL 48 >39 mg/dL   VLDL Cholesterol Cal 26 5 - 40 mg/dL   LDL Chol Calc (NIH) 78 0 - 99 mg/dL      Assessment & Plan:   Problem List Items Addressed This Visit      Respiratory   Viral upper respiratory tract infection - Primary    Acute, symptoms improving. COVID-19 test pending. Symptomatic management with antipyretics and antitussives. Discussed projected length of acute disease for mild illness. Encouraged hydration, rest, and symptomatic management. Regular f/u scheduled with PCP next week. Call back to clinic if symptoms worsen. Go to ED with SOB or chest pain.           Follow up plan: Return if symptoms worsen or fail to improve.   Due to the catastrophic nature of the COVID-19 pandemic, this visit was done through audio contact only.","This visit was completed via Doximity due to the restrictions of the COVID-19 pandemic. All issues as above were discussed and addressed. Physical exam was done as above through visual confirmation on Doximity. If it was felt that the patient should be evaluated in the office, they were directed  there. The patient verbally consented to this visit."} . Location of the patient: home . Location of the provider: work . Those involved with this call:  . Provider: Carnella Guadalajara, DNP . CMA: Lauretta Grill, CMA . Front Desk/Registration: Don Perking  . Time spent on call: 14 minutes on the phone discussing health concerns. 30 minutes total spent in review of patient's record and preparation of their chart.

## 2019-06-02 ENCOUNTER — Encounter: Payer: Self-pay | Admitting: Nurse Practitioner

## 2019-06-05 ENCOUNTER — Ambulatory Visit: Payer: Medicare Other | Admitting: Nurse Practitioner

## 2019-06-10 ENCOUNTER — Other Ambulatory Visit: Payer: Self-pay | Admitting: Nurse Practitioner

## 2019-06-11 ENCOUNTER — Other Ambulatory Visit: Payer: Self-pay | Admitting: Nurse Practitioner

## 2019-06-11 ENCOUNTER — Other Ambulatory Visit: Payer: Self-pay | Admitting: Family Medicine

## 2019-07-04 ENCOUNTER — Encounter: Payer: Self-pay | Admitting: Nurse Practitioner

## 2019-07-04 ENCOUNTER — Telehealth (INDEPENDENT_AMBULATORY_CARE_PROVIDER_SITE_OTHER): Payer: Medicare Other | Admitting: Nurse Practitioner

## 2019-07-04 ENCOUNTER — Ambulatory Visit: Payer: Medicare Other | Admitting: Nurse Practitioner

## 2019-07-04 VITALS — BP 125/65 | HR 83 | Wt 208.0 lb

## 2019-07-04 DIAGNOSIS — E6609 Other obesity due to excess calories: Secondary | ICD-10-CM | POA: Diagnosis not present

## 2019-07-04 DIAGNOSIS — E1121 Type 2 diabetes mellitus with diabetic nephropathy: Secondary | ICD-10-CM

## 2019-07-04 DIAGNOSIS — E039 Hypothyroidism, unspecified: Secondary | ICD-10-CM | POA: Diagnosis not present

## 2019-07-04 DIAGNOSIS — I480 Paroxysmal atrial fibrillation: Secondary | ICD-10-CM | POA: Diagnosis not present

## 2019-07-04 DIAGNOSIS — D696 Thrombocytopenia, unspecified: Secondary | ICD-10-CM

## 2019-07-04 DIAGNOSIS — E538 Deficiency of other specified B group vitamins: Secondary | ICD-10-CM

## 2019-07-04 DIAGNOSIS — N1832 Chronic kidney disease, stage 3b: Secondary | ICD-10-CM | POA: Diagnosis not present

## 2019-07-04 DIAGNOSIS — I1310 Hypertensive heart and chronic kidney disease without heart failure, with stage 1 through stage 4 chronic kidney disease, or unspecified chronic kidney disease: Secondary | ICD-10-CM | POA: Diagnosis not present

## 2019-07-04 DIAGNOSIS — E1122 Type 2 diabetes mellitus with diabetic chronic kidney disease: Secondary | ICD-10-CM

## 2019-07-04 DIAGNOSIS — Z6833 Body mass index (BMI) 33.0-33.9, adult: Secondary | ICD-10-CM

## 2019-07-04 DIAGNOSIS — E785 Hyperlipidemia, unspecified: Secondary | ICD-10-CM | POA: Diagnosis not present

## 2019-07-04 DIAGNOSIS — E1169 Type 2 diabetes mellitus with other specified complication: Secondary | ICD-10-CM | POA: Diagnosis not present

## 2019-07-04 NOTE — Assessment & Plan Note (Signed)
Chronic, ongoing.  Continue to collaborate with nephrology and renal dose medication, Lisinopril for kidney protection.  CMP outpatient.

## 2019-07-04 NOTE — Assessment & Plan Note (Signed)
Recommend continued focus on healthy diet choices and regular physical activity (30 minutes 5 days a week).  

## 2019-07-04 NOTE — Assessment & Plan Note (Signed)
Chronic, ongoing.  Continue Lisinopril for kidney protection.  Continue current medication regimen and collaboration with cardiology and nephrology.  BP at goal on home readings, continue to monitor these.  Return in 3 months. Obtain outpatient labs.

## 2019-07-04 NOTE — Assessment & Plan Note (Addendum)
Chronic, ongoing.  Continue with current regimen and adjust as needed based on labs.  Lipid panel and CMP outpatient.

## 2019-07-04 NOTE — Progress Notes (Signed)
BP 125/65   Pulse 83   Wt 208 lb (94.3 kg)   SpO2 98%   BMI 29.84 kg/m    Subjective:    Patient ID: Jonathan [name], male    DOB: Sep 04, 1945, 74 y.o.   MRN: [id number]  HPI: Jonathan [name] is a 74 y.o. male  Chief Complaint  Patient presents with  . Follow-up    T2DM, HTN/HLD, CKD, Hypothyroid.    . This visit was completed via MyChart due to the restrictions of the COVID-19 pandemic. All issues as above were discussed and addressed. Physical exam was done as above through visual confirmation on MyChart. If it was felt that the patient should be evaluated in the office, they were directed there. The patient verbally consented to this visit. . Location of the patient: home . Location of the provider: work . Those involved with this call:  . Provider: Marnee Guarneri, DNP . CMA: Yvonna Alanis, CMA . Front Desk/Registration: Don Perking  . Time spent on call: 15 minutes with patient face to face via video conference. More than 50% of this time was spent in counseling and coordination of care. 10 minutes total spent in review of patient's record and preparation of their chart.  . I verified patient identity using two factors (patient name and date of birth). Patient consents verbally to being seen via telemedicine visit today.    DIABETES Last A1C was 7.8% in September.  Januvia 50 MG daily, renal dosed. Continues on Trulicity 1.5 MG weekly and Metformin 500 MG BID.  Reports he has been trying to lose weight and focused on diet.  Believes he has still lost weight due to recent Covid in December.  Tried Invokana, but was discontinued in past due to kidney function.  Last B12 level in 500 range and no current supplement, had run lower prior to this. Hypoglycemic episodes:no Polydipsia/polyuria: no Visual disturbance: no Chest pain: no Paresthesias: no Glucose Monitoring: yes  Accucheck frequency: weekly  Fasting glucose: 160-170 in morning  Post prandial:  Evening: 140  -150   Before meals: Taking Insulin?: no  Long acting insulin:  Short acting insulin: Blood Pressure Monitoring: daily Retinal Examination: not up to date Foot Exam: Up to Date Pneumovax: Up to Date Influenza: Up to Date Aspirin: no   HYPERTENSION / HYPERLIPIDEMIA WITH CKD & AFIB Continues on Lisinopril 20 MG, Metoprolol 50 MG BID, and Atorvastatin 80 MG.  He goes to nephrology, but has been stable with kidney function for the past year in CKD 3, September GFR 39 and CRT 1.70. Sees Dr. Nehemiah Massed for cardiology, last saw 03/05/2019 with no changes made.  Had seen Dr. Marcello Moores from EP about left atrial appendage closure device, but have opted not to do this at this time.  Last EF was 50% with mild LVH.  Had been on Pradexa in past prior to ablation for a-fib.  History of low PLT on labs, 118 last check.  Denies any bruising or bleeding.  Seen by hematology for this last in 2019. Satisfied with current treatment? yes Duration of hypertension: chronic BP monitoring frequency: daily BP range: 125-130/70 BP medication side effects: no Duration of hyperlipidemia: chronic Cholesterol medication side effects: no Cholesterol supplements: none Medication compliance: good compliance Aspirin: no Recent stressors: no Recurrent headaches: no Visual changes: no Palpitations: no Dyspnea: no Chest pain: no Lower extremity edema: no Dizzy/lightheaded: no   HYPOTHYROIDISM Continues on Levothyroxine 125 MCG daily with last TSH in February 2020 = 1.010. Thyroid  control status:stable Satisfied with current treatment? yes Medication side effects: no Medication compliance: good compliance Etiology of hypothyroidism:  Recent dose adjustment:no Fatigue: no Cold intolerance: no Heat intolerance: no Weight gain: no Weight loss: no Constipation: no Diarrhea/loose stools: no Palpitations: no Lower extremity edema: no Anxiety/depressed mood: no  Relevant past medical, surgical, family and social  history reviewed and updated as indicated. Interim medical history since our last visit reviewed. Allergies and medications reviewed and updated.  Review of Systems  Constitutional: Negative for activity change, diaphoresis, fatigue and fever.  Respiratory: Negative for cough, chest tightness, shortness of breath and wheezing.   Cardiovascular: Negative for chest pain, palpitations and leg swelling.  Gastrointestinal: Negative.   Endocrine: Negative for cold intolerance, heat intolerance, polydipsia, polyphagia and polyuria.  Neurological: Negative.   Psychiatric/Behavioral: Negative.     Per HPI unless specifically indicated above     Objective:    BP 125/65   Pulse 83   Wt 208 lb (94.3 kg)   SpO2 98%   BMI 29.84 kg/m   Wt Readings from Last 3 Encounters:  07/04/19 208 lb (94.3 kg)  05/21/19 210 lb (95.3 kg)  02/06/19 208 lb (94.3 kg)    Physical Exam Vitals and nursing note reviewed.  Constitutional:      General: He is awake. He is not in acute distress.    Appearance: He is well-developed. He is not ill-appearing.  HENT:     Head: Normocephalic.     Right Ear: Hearing normal. No drainage.     Left Ear: Hearing normal. No drainage.  Eyes:     General: Lids are normal.        Right eye: No discharge.        Left eye: No discharge.     Conjunctiva/sclera: Conjunctivae normal.  Pulmonary:     Effort: Pulmonary effort is normal. No accessory muscle usage or respiratory distress.  Musculoskeletal:     Cervical back: Normal range of motion.  Neurological:     Mental Status: He is alert and oriented to person, place, and time.  Psychiatric:        Mood and Affect: Mood normal.        Behavior: Behavior normal. Behavior is cooperative.        Thought Content: Thought content normal.        Judgment: Judgment normal.     Results for orders placed or performed in visit on 05/29/19  Novel Coronavirus, NAA ([facility])   Specimen: Nasopharyngeal(NP) swabs in vial  transport medium   NASOPHARYNGE  TESTING  Result Value Ref Range   SARS-CoV-2, NAA Detected (A) Not Detected      Assessment & Plan:   Problem List Items Addressed This Visit      Cardiovascular and Mediastinum   Hypertensive heart/kidney disease without HF and with CKD stage III    Chronic, ongoing.  Continue Lisinopril for kidney protection.  Continue current medication regimen and collaboration with cardiology and nephrology.  BP at goal on home readings, continue to monitor these.  Return in 3 months. Obtain outpatient labs.      Relevant Orders   Comprehensive metabolic panel   Microalbumin, Urine Waived   Paroxysmal A-fib (HCC)    Chronic, ongoing.  Rate controlled.  Continue current medication regimen and collaboration with cardiology.  No current anti-coagulant.          Endocrine   Hypothyroidism    Chronic, ongoing.  Continue current medication regimen.  TSH outpatient  and adjust dose as needed.      Relevant Orders   Thyroid Panel With TSH   Hyperlipidemia associated with type 2 diabetes mellitus (HCC)    Chronic, ongoing.  Continue with current regimen and adjust as needed based on labs.  Lipid panel and CMP outpatient.      Relevant Orders   Bayer DCA Hb A1c Waived   Lipid Panel Piccolo, Waived   Type 2 diabetes mellitus with chronic kidney disease, without long-term current use of insulin (HCC) - Primary    Chronic, ongoing.  Last A1C 7.8% in September.  Continue current medication regimen (renal dosed) and will adjust as needed based on A1C.  If continue elevations may need to consider increase in Trulicity to 3 MG weekly.  Renal function limits medications that can be used.  Recommend he check blood sugar at least once a day in morning and document these for provider.  Provided information to him on CCM team, he wishes to wait on referral.  Return in 3 months, obtain outpatient labs.      Relevant Orders   Bayer DCA Hb A1c Waived   Microalbumin, Urine Waived      Genitourinary   CKD (chronic kidney disease), stage III    Chronic, ongoing.  Continue to collaborate with nephrology and renal dose medication, Lisinopril for kidney protection.  CMP outpatient.      Relevant Orders   Comprehensive metabolic panel   Microalbumin, Urine Waived     Other   Obesity    Recommend continued focus on healthy diet choices and regular physical activity (30 minutes 5 days a week).       Thrombocytopenia (HCC)    Recheck CBC outpatient, no current symptoms.  Previously followed by Dr. Kizzie Bane.      Relevant Orders   CBC with Differential/Platelet   Vitamin B12 deficiency    Ongoing with chronic Metformin use, will recheck this level outpatient and return to supplement if needed.      Relevant Orders   Vitamin B12      I discussed the assessment and treatment plan with the patient. The patient was provided an opportunity to ask questions and all were answered. The patient agreed with the plan and demonstrated an understanding of the instructions.   The patient was advised to call back or seek an in-person evaluation if the symptoms worsen or if the condition fails to improve as anticipated.   I provided 15 minutes of time during this encounter.  Follow up plan: Return in about 3 months (around 10/01/2019) for T2DM, HTN/HLD, CKD.

## 2019-07-04 NOTE — Assessment & Plan Note (Signed)
Recheck CBC outpatient, no current symptoms.  Previously followed by Dr. Kizzie Bane.

## 2019-07-04 NOTE — Assessment & Plan Note (Signed)
Chronic, ongoing.  Rate controlled.  Continue current medication regimen and collaboration with cardiology.  No current anti-coagulant.

## 2019-07-04 NOTE — Patient Instructions (Signed)
Carbohydrate Counting for Diabetes Mellitus, Adult  Carbohydrate counting is a method of keeping track of how many carbohydrates you eat. Eating carbohydrates naturally increases the amount of sugar (glucose) in the blood. Counting how many carbohydrates you eat helps keep your blood glucose within normal limits, which helps you manage your diabetes (diabetes mellitus). It is important to know how many carbohydrates you can safely have in each meal. This is different for every person. A diet and nutrition specialist (registered dietitian) can help you make a meal plan and calculate how many carbohydrates you should have at each meal and snack. Carbohydrates are found in the following foods:  Grains, such as breads and cereals.  Dried beans and soy products.  Starchy vegetables, such as potatoes, peas, and corn.  Fruit and fruit juices.  Milk and yogurt.  Sweets and snack foods, such as cake, cookies, candy, chips, and soft drinks. How do I count carbohydrates? There are two ways to count carbohydrates in food. You can use either of the methods or a combination of both. Reading "Nutrition Facts" on packaged food The "Nutrition Facts" list is included on the labels of almost all packaged foods and beverages in the [place] It includes:  The serving size.  Information about nutrients in each serving, including the grams (g) of carbohydrate per serving. To use the "Nutrition Facts":  Decide how many servings you will have.  Multiply the number of servings by the number of carbohydrates per serving.  The resulting number is the total amount of carbohydrates that you will be having. Learning standard serving sizes of other foods When you eat carbohydrate foods that are not packaged or do not include "Nutrition Facts" on the label, you need to measure the servings in order to count the amount of carbohydrates:  Measure the foods that you will eat with a food scale or measuring cup, if  needed.  Decide how many standard-size servings you will eat.  Multiply the number of servings by 15. Most carbohydrate-rich foods have about 15 g of carbohydrates per serving. ? For example, if you eat 8 oz (170 g) of strawberries, you will have eaten 2 servings and 30 g of carbohydrates (2 servings x 15 g = 30 g).  For foods that have more than one food mixed, such as soups and casseroles, you must count the carbohydrates in each food that is included. The following list contains standard serving sizes of common carbohydrate-rich foods. Each of these servings has about 15 g of carbohydrates:   hamburger bun or  English muffin.   oz (15 mL) syrup.   oz (14 g) jelly.  1 slice of bread.  1 six-inch tortilla.  3 oz (85 g) cooked rice or pasta.  4 oz (113 g) cooked dried beans.  4 oz (113 g) starchy vegetable, such as peas, corn, or potatoes.  4 oz (113 g) hot cereal.  4 oz (113 g) mashed potatoes or  of a large baked potato.  4 oz (113 g) canned or frozen fruit.  4 oz (120 mL) fruit juice.  4-6 crackers.  6 chicken nuggets.  6 oz (170 g) unsweetened dry cereal.  6 oz (170 g) plain fat-free yogurt or yogurt sweetened with artificial sweeteners.  8 oz (240 mL) milk.  8 oz (170 g) fresh fruit or one small piece of fruit.  24 oz (680 g) popped popcorn. Example of carbohydrate counting Sample meal  3 oz (85 g) chicken breast.  6 oz (170 g)   brown rice.  4 oz (113 g) corn.  8 oz (240 mL) milk.  8 oz (170 g) strawberries with sugar-free whipped topping. Carbohydrate calculation 1. Identify the foods that contain carbohydrates: ? Rice. ? Corn. ? Milk. ? Strawberries. 2. Calculate how many servings you have of each food: ? 2 servings rice. ? 1 serving corn. ? 1 serving milk. ? 1 serving strawberries. 3. Multiply each number of servings by 15 g: ? 2 servings rice x 15 g = 30 g. ? 1 serving corn x 15 g = 15 g. ? 1 serving milk x 15 g = 15 g. ? 1  serving strawberries x 15 g = 15 g. 4. Add together all of the amounts to find the total grams of carbohydrates eaten: ? 30 g + 15 g + 15 g + 15 g = 75 g of carbohydrates total. Summary  Carbohydrate counting is a method of keeping track of how many carbohydrates you eat.  Eating carbohydrates naturally increases the amount of sugar (glucose) in the blood.  Counting how many carbohydrates you eat helps keep your blood glucose within normal limits, which helps you manage your diabetes.  A diet and nutrition specialist (registered dietitian) can help you make a meal plan and calculate how many carbohydrates you should have at each meal and snack. This information is not intended to replace advice given to you by your health care provider. Make sure you discuss any questions you have with your health care provider. Document Revised: 12/02/2016 Document Reviewed: 10/22/2015 Elsevier Patient Education  2020 Else[employer] Inc.  

## 2019-07-04 NOTE — Assessment & Plan Note (Addendum)
Chronic, ongoing.  Last A1C 7.8% in September.  Continue current medication regimen (renal dosed) and will adjust as needed based on A1C.  If continue elevations may need to consider increase in Trulicity to 3 MG weekly.  Renal function limits medications that can be used.  Recommend he check blood sugar at least once a day in morning and document these for provider.  Provided information to him on CCM team, he wishes to wait on referral.  Return in 3 months, obtain outpatient labs.

## 2019-07-04 NOTE — Assessment & Plan Note (Signed)
Ongoing with chronic Metformin use, will recheck this level outpatient and return to supplement if needed.

## 2019-07-04 NOTE — Assessment & Plan Note (Signed)
Chronic, ongoing.  Continue current medication regimen.  TSH outpatient and adjust dose as needed.

## 2019-07-13 ENCOUNTER — Other Ambulatory Visit: Payer: Medicare Other

## 2019-07-13 ENCOUNTER — Other Ambulatory Visit: Payer: Self-pay

## 2019-07-13 DIAGNOSIS — E1169 Type 2 diabetes mellitus with other specified complication: Secondary | ICD-10-CM | POA: Diagnosis not present

## 2019-07-13 DIAGNOSIS — D696 Thrombocytopenia, unspecified: Secondary | ICD-10-CM | POA: Diagnosis not present

## 2019-07-13 DIAGNOSIS — E1122 Type 2 diabetes mellitus with diabetic chronic kidney disease: Secondary | ICD-10-CM

## 2019-07-13 DIAGNOSIS — E039 Hypothyroidism, unspecified: Secondary | ICD-10-CM | POA: Diagnosis not present

## 2019-07-13 DIAGNOSIS — I1310 Hypertensive heart and chronic kidney disease without heart failure, with stage 1 through stage 4 chronic kidney disease, or unspecified chronic kidney disease: Secondary | ICD-10-CM | POA: Diagnosis not present

## 2019-07-13 DIAGNOSIS — N1832 Chronic kidney disease, stage 3b: Secondary | ICD-10-CM

## 2019-07-13 DIAGNOSIS — E785 Hyperlipidemia, unspecified: Secondary | ICD-10-CM | POA: Diagnosis not present

## 2019-07-13 DIAGNOSIS — E1121 Type 2 diabetes mellitus with diabetic nephropathy: Secondary | ICD-10-CM | POA: Diagnosis not present

## 2019-07-13 DIAGNOSIS — E538 Deficiency of other specified B group vitamins: Secondary | ICD-10-CM

## 2019-07-13 LAB — LIPID PANEL PICCOLO, WAIVED
Chol/HDL Ratio Piccolo,Waive: 3.1 mg/dL
Cholesterol Piccolo, Waived: 151 mg/dL (ref <200)
HDL Chol Piccolo, Waived: 49 mg/dL — ABNORMAL LOW (ref >59)
LDL Chol Calc Piccolo Waived: 74 mg/dL (ref <100)
Triglycerides Piccolo,Waived: 139 mg/dL (ref <150)
VLDL Chol Calc Piccolo,Waive: 28 mg/dL (ref <30)

## 2019-07-13 LAB — MICROALBUMIN, URINE WAIVED
Creatinine, Urine Waived: 50 mg/dL (ref 10–300)
Microalb, Ur Waived: 10 mg/L (ref 0–19)
Microalb/Creat Ratio: <30 mg/g (ref <30)

## 2019-07-13 LAB — BAYER DCA HB A1C WAIVED: HB A1C (BAYER DCA - WAIVED): 7.3 % — ABNORMAL HIGH (ref <7.0)

## 2019-07-14 LAB — CBC WITH DIFFERENTIAL/PLATELET
Basophils Absolute: 0.1 10*3/uL (ref 0.0–0.2)
Basos: 1 %
EOS (ABSOLUTE): 0.2 10*3/uL (ref 0.0–0.4)
Eos: 4 %
Hematocrit: 41.1 % (ref 37.5–51.0)
Hemoglobin: 14.1 g/dL (ref 13.0–17.7)
Immature Grans (Abs): 0 10*3/uL (ref 0.0–0.1)
Immature Granulocytes: 0 %
Lymphocytes Absolute: 1.1 10*3/uL (ref 0.7–3.1)
Lymphs: 28 %
MCH: 29.3 pg (ref 26.6–33.0)
MCHC: 34.3 g/dL (ref 31.5–35.7)
MCV: 85 fL (ref 79–97)
Monocytes Absolute: 0.5 10*3/uL (ref 0.1–0.9)
Monocytes: 12 %
Neutrophils Absolute: 2.2 10*3/uL (ref 1.4–7.0)
Neutrophils: 55 %
Platelets: 149 10*3/uL — ABNORMAL LOW (ref 150–450)
RBC: 4.81 x10E6/uL (ref 4.14–5.80)
RDW: 15.2 % (ref 11.6–15.4)
WBC: 4.1 10*3/uL (ref 3.4–10.8)

## 2019-07-14 LAB — COMPREHENSIVE METABOLIC PANEL
ALT: 11 IU/L (ref 0–44)
AST: 16 IU/L (ref 0–40)
Albumin/Globulin Ratio: 1.8 (ref 1.2–2.2)
Albumin: 4.4 g/dL (ref 3.7–4.7)
Alkaline Phosphatase: 88 IU/L (ref 39–117)
BUN/Creatinine Ratio: 14 (ref 10–24)
BUN: 21 mg/dL (ref 8–27)
Bilirubin Total: 0.6 mg/dL (ref 0.0–1.2)
CO2: 23 mmol/L (ref 20–29)
Calcium: 9 mg/dL (ref 8.6–10.2)
Chloride: 100 mmol/L (ref 96–106)
Creatinine, Ser: 1.46 mg/dL — ABNORMAL HIGH (ref 0.76–1.27)
GFR calc Af Amer: 54 mL/min/{1.73_m2} — ABNORMAL LOW (ref >59)
GFR calc non Af Amer: 47 mL/min/{1.73_m2} — ABNORMAL LOW (ref >59)
Globulin, Total: 2.5 g/dL (ref 1.5–4.5)
Glucose: 158 mg/dL — ABNORMAL HIGH (ref 65–99)
Potassium: 4.4 mmol/L (ref 3.5–5.2)
Sodium: 138 mmol/L (ref 134–144)
Total Protein: 6.9 g/dL (ref 6.0–8.5)

## 2019-07-14 LAB — THYROID PANEL WITH TSH
Free Thyroxine Index: 3.2 (ref 1.2–4.9)
T3 Uptake Ratio: 34 % (ref 24–39)
T4, Total: 9.3 ug/dL (ref 4.5–12.0)
TSH: 1.08 u[IU]/mL (ref 0.450–4.500)

## 2019-07-14 LAB — VITAMIN B12: Vitamin B-12: 971 pg/mL (ref 232–1245)

## 2019-07-14 NOTE — Progress Notes (Signed)
Sent via MyChart Good afternoon Jonathan [name]. Your labs have returned.  - B12 is in good range, continue supplement. - TSH is normal, continue current Levothyroxine dose - Continue to show lower platelets, but these are trending upwards at 149, almost normal. - Continue to show kidney disease stage 3, but no significant decline, that is good. Potassium now in normal range and not high. - Urine show minimal protein -- that is VERY good - Cholesterol levels are close to goal, continue Atorvastatin daily. - A1C is coming down from previous, but still above 7. It may benefit you to increase Trulicity to 3 MG weekly. Would you like to try this? This may get [place] right at goal or below. Let me know your thoughts.   Have a great weekend and it was so nice to see you:)

## 2019-07-17 ENCOUNTER — Encounter: Payer: Self-pay | Admitting: Nurse Practitioner

## 2019-08-23 ENCOUNTER — Encounter: Payer: Self-pay | Admitting: Nurse Practitioner

## 2019-08-30 ENCOUNTER — Ambulatory Visit: Payer: Medicare Other | Attending: Internal Medicine

## 2019-08-30 DIAGNOSIS — Z23 Encounter for immunization: Secondary | ICD-10-CM

## 2019-08-30 NOTE — Progress Notes (Signed)
   Covid-19 Vaccination Clinic  Name:  Jonathan [name]    MRN: [id number] DOB: 03-25-1946  08/30/2019  [name] was observed post Covid-19 immunization for 15 minutes without incident. He was provided with Vaccine Information Sheet and instruction to access the V-Safe system.   [name] was instructed to call 911 with any severe reactions post vaccine: Marland Kitchen Difficulty breathing  . Swelling of face and throat  . A fast heartbeat  . A bad rash all over body  . Dizziness and weakness   Immunizations Administered    Name Date Dose VIS Date Route   Pfizer COVID-19 Vaccine 08/30/2019  9:08 AM 0.3 mL 05/04/2019 Intramuscular   Manufacturer: [employer], [employer]   Lot: [id number]   NDC: [address]-9

## 2019-09-02 ENCOUNTER — Other Ambulatory Visit: Payer: Self-pay | Admitting: Nurse Practitioner

## 2019-09-02 NOTE — Telephone Encounter (Signed)
Requested Prescriptions  Pending Prescriptions Disp Refills  . metFORMIN (GLUCOPHAGE) 500 MG tablet [Pharmacy Med Name: METFORMIN HCL 500 MG TABLET] 180 tablet 0    Sig: TAKE 1 TABLET (500 MG TOTAL) BY MOUTH 2 (TWO) TIMES DAILY WITH A MEAL.     Endocrinology:  Diabetes - Biguanides Failed - 09/02/2019  9:40 AM      Failed - Cr in normal range and within 360 days    Creatinine  Date Value Ref Range Status  11/15/2011 1.41 (H) 0.60 - 1.30 mg/dL Final   Creatinine, Ser  Date Value Ref Range Status  07/13/2019 1.46 (H) 0.76 - 1.27 mg/dL Final         Failed - eGFR in normal range and within 360 days    EGFR (African American)  Date Value Ref Range Status  11/15/2011 60 (L)  Final   GFR calc Af Amer  Date Value Ref Range Status  07/13/2019 54 (L) >59 mL/min/1.73 Final   EGFR (Non-African Amer.)  Date Value Ref Range Status  11/15/2011 52 (L)  Final    Comment:    eGFR values <16m/min/1.73 m2 may be an indication of chronic kidney disease (CKD). Calculated eGFR is useful in patients with stable renal function. The eGFR calculation will not be reliable in acutely ill patients when serum creatinine is changing rapidly. It is not useful in  patients on dialysis. The eGFR calculation may not be applicable to patients at the low and high extremes of body sizes, pregnant women, and vegetarians.    GFR calc non Af Amer  Date Value Ref Range Status  07/13/2019 47 (L) >59 mL/min/1.73 Final         Passed - HBA1C is between 0 and 7.9 and within 180 days    HB A1C (BAYER DCA - WAIVED)  Date Value Ref Range Status  07/13/2019 7.3 (H) <7.0 % Final    Comment:                                          Diabetic Adult            <7.0                                       Healthy Adult        4.3 - 5.7                                                           (DCCT/NGSP) American Diabetes Association's Summary of Glycemic Recommendations for Adults with Diabetes: Hemoglobin A1c <7.0%.  More stringent glycemic goals (A1c <6.0%) may further reduce complications at the cost of increased risk of hypoglycemia.          Passed - Valid encounter within last 6 months    Recent Outpatient Visits          2 months ago Type 2 diabetes mellitus with stage 3b chronic kidney disease, without long-term current use of insulin ([place], [place]   3 months ago Viral upper respiratory tract infection   [facility] Carnella [place] I, NP   6 months ago Type 2 diabetes mellitus with stage 3 chronic kidney disease, without long-term current use of insulin ([place])   [place], Craig T, NP   8 months ago [place], [place]   9 months ago Wound infection   [place], Barbaraann Faster, NP      Future Appointments            In 1 month Cann[place], Barbaraann Faster, NP [facility]   In 8 months  [facility]

## 2019-09-05 ENCOUNTER — Encounter: Payer: Self-pay | Admitting: Nurse Practitioner

## 2019-09-05 ENCOUNTER — Other Ambulatory Visit: Payer: Self-pay

## 2019-09-05 ENCOUNTER — Ambulatory Visit (INDEPENDENT_AMBULATORY_CARE_PROVIDER_SITE_OTHER): Payer: Medicare Other | Admitting: Nurse Practitioner

## 2019-09-05 DIAGNOSIS — H6122 Impacted cerumen, left ear: Secondary | ICD-10-CM | POA: Diagnosis not present

## 2019-09-05 DIAGNOSIS — H6120 Impacted cerumen, unspecified ear: Secondary | ICD-10-CM | POA: Insufficient documentation

## 2019-09-05 NOTE — Assessment & Plan Note (Signed)
Acute, L>R.  Irrigation performed with benefit to right side, but continues to have some impaction left side.  Recommend he place Debrox drops daily over next 5 days and then return on Monday for repeat irrigation.  Educated on Debrox and use, to have soften cerumen.  He agrees with this plan of care.  Will refer to ENT if ongoing issues.

## 2019-09-05 NOTE — Patient Instructions (Signed)
Earwax Buildup, Adult The ears produce a substance called earwax that helps keep bacteria out of the ear and protects the skin in the ear canal. Occasionally, earwax can build up in the ear and cause discomfort or hearing loss. What increases the risk? This condition is more likely to develop in people who:  Are male.  Are elderly.  Naturally produce more earwax.  Clean their ears often with cotton swabs.  Use earplugs often.  Use in-ear headphones often.  Wear hearing aids.  Have narrow ear canals.  Have earwax that is overly thick or sticky.  Have eczema.  Are dehydrated.  Have excess hair in the ear canal. What are the signs or symptoms? Symptoms of this condition include:  Reduced or muffled hearing.  A feeling of fullness in the ear or feeling that the ear is plugged.  Fluid coming from the ear.  Ear pain.  Ear itch.  Ringing in the ear.  Coughing.  An obvious piece of earwax that can be seen inside the ear canal. How is this diagnosed? This condition may be diagnosed based on:  Your symptoms.  Your medical history.  An ear exam. During the exam, your health care provider will look into your ear with an instrument called an otoscope. You may have tests, including a hearing test. How is this treated? This condition may be treated by:  Using ear drops to soften the earwax.  Having the earwax removed by a health care provider. The health care provider may: ? Flush the ear with water. ? Use an instrument that has a loop on the end (curette). ? Use a suction device.  Surgery to remove the wax buildup. This may be done in severe cases. Follow these instructions at home:   Take over-the-counter and prescription medicines only as told by your health care provider.  Do not put any objects, including cotton swabs, into your ear. You can clean the opening of your ear canal with a washcloth or facial tissue.  Follow instructions from your health care  provider about cleaning your ears. Do not over-clean your ears.  Drink enough fluid to keep your urine clear or pale yellow. This will help to thin the earwax.  Keep all follow-up visits as told by your health care provider. If earwax builds up in your ears often or if you use hearing aids, consider seeing your health care provider for routine, preventive ear cleanings. Ask your health care provider how often you should schedule your cleanings.  If you have hearing aids, clean them according to instructions from the manufacturer and your health care provider. Contact a health care provider if:  You have ear pain.  You develop a fever.  You have blood, pus, or other fluid coming from your ear.  You have hearing loss.  You have ringing in your ears that does not go away.  Your symptoms do not improve with treatment.  You feel like the room is spinning (vertigo). Summary  Earwax can build up in the ear and cause discomfort or hearing loss.  The most common symptoms of this condition include reduced or muffled hearing and a feeling of fullness in the ear or feeling that the ear is plugged.  This condition may be diagnosed based on your symptoms, your medical history, and an ear exam.  This condition may be treated by using ear drops to soften the earwax or by having the earwax removed by a health care provider.  Do not put any   objects, including cotton swabs, into your ear. You can clean the opening of your ear canal with a washcloth or facial tissue. This information is not intended to replace advice given to you by your health care provider. Make sure you discuss any questions you have with your health care provider. Document Revised: 04/22/2017 Document Reviewed: 07/21/2016 Elsevier Patient Education  2020 Elsevier Inc.  

## 2019-09-05 NOTE — Progress Notes (Signed)
BP 137/83 (BP Location: Left Arm, Cuff Size: Normal)   Pulse 91   Temp 97.9 F (36.6 C) (Oral)   SpO2 97%    Subjective:    Patient ID: Jonathan [name], male    DOB: 08/20/1945, 74 y.o.   MRN: [id number]  HPI: DAANISH COPES is a 74 y.o. male  Chief Complaint  Patient presents with  . Cerumen Impaction    pt states he has wax build up in R ear, has not used drops    CERUMEN IMPACTION: Noticed difficulty hearing out of right ear a few days ago.  Has had issues with cerumen impaction before, about every 2-3 years.  Does not use Debrox at home.  Reports it is hard to hear out of right ear. Fever: no Otorrhea: no Upper respiratory infection symptoms: no Pruritus: no Hearing loss: no Water immersion no Using Q-tips: yes Recurrent otitis media: no Status: stable Treatments attempted: none  Relevant past medical, surgical, family and social history reviewed and updated as indicated. Interim medical history since our last visit reviewed. Allergies and medications reviewed and updated.  Review of Systems  Constitutional: Negative for activity change, diaphoresis, fatigue and fever.  Respiratory: Negative for cough, chest tightness, shortness of breath and wheezing.   Cardiovascular: Negative for chest pain, palpitations and leg swelling.  Psychiatric/Behavioral: Negative.    Per HPI unless specifically indicated above     Objective:    BP 137/83 (BP Location: Left Arm, Cuff Size: Normal)   Pulse 91   Temp 97.9 F (36.6 C) (Oral)   SpO2 97%   Wt Readings from Last 3 Encounters:  07/04/19 208 lb (94.3 kg)  05/21/19 210 lb (95.3 kg)  02/06/19 208 lb (94.3 kg)    Physical Exam Vitals and nursing note reviewed.  Constitutional:      General: He is awake. He is not in acute distress.    Appearance: He is well-developed and well-groomed. He is not ill-appearing.  HENT:     Head: Normocephalic and atraumatic.     Right Ear: Hearing, tympanic membrane, ear canal and  external ear normal. No drainage.     Left Ear: Hearing, ear canal and external ear normal. There is impacted cerumen.     Ears:     Comments: Able to view TM right side and bony landmarks, scant cerumen present.  Left side with moderate cerumen.  Irrigated both ears with lukewarm water, right clear and left continues to have moderate cerumen, able to get small amount removed with irrigation and curette.      Mouth/Throat:     Comments: Mask in place Eyes:     General: Lids are normal.        Right eye: No discharge.        Left eye: No discharge.     Conjunctiva/sclera: Conjunctivae normal.     Pupils: Pupils are equal, round, and reactive to light.  Cardiovascular:     Rate and Rhythm: Normal rate and regular rhythm.     Heart sounds: Normal heart sounds, S1 normal and S2 normal. No murmur. No gallop.   Pulmonary:     Effort: Pulmonary effort is normal. No accessory muscle usage or respiratory distress.     Breath sounds: Normal breath sounds.  Abdominal:     General: Bowel sounds are normal.     Palpations: Abdomen is soft.  Musculoskeletal:        General: Normal range of motion.  Cervical back: Normal range of motion and neck supple.     Right lower leg: No edema.     Left lower leg: No edema.  Skin:    General: Skin is warm and dry.  Neurological:     Mental Status: He is alert and oriented to person, place, and time.  Psychiatric:        Attention and Perception: Attention normal.        Mood and Affect: Mood normal.        Behavior: Behavior normal. Behavior is cooperative.        Thought Content: Thought content normal.     Results for orders placed or performed in visit on 07/13/19  Thyroid Panel With TSH  Result Value Ref Range   TSH 1.080 0.450 - 4.500 uIU/mL   T4, Total 9.3 4.5 - 12.0 ug/dL   T3 Uptake Ratio 34 24 - 39 %   Free Thyroxine Index 3.2 1.2 - 4.9  Vitamin B12  Result Value Ref Range   Vitamin B-12 971 232 - 1,245 pg/mL  CBC with  Differential/Platelet  Result Value Ref Range   WBC 4.1 3.4 - 10.8 x10E3/uL   RBC 4.81 4.14 - 5.80 x10E6/uL   Hemoglobin 14.1 13.0 - 17.7 g/dL   Hematocrit 16.1 09.6 - 51.0 %   MCV 85 79 - 97 fL   MCH 29.3 26.6 - 33.0 pg   MCHC 34.3 31.5 - 35.7 g/dL   RDW 04.5 40.9 - 81.1 %   Platelets 149 (L) 150 - 450 x10E3/uL   Neutrophils 55 Not Estab. %   Lymphs 28 Not Estab. %   Monocytes 12 Not Estab. %   Eos 4 Not Estab. %   Basos 1 Not Estab. %   Neutrophils Absolute 2.2 1.4 - 7.0 x10E3/uL   Lymphocytes Absolute 1.1 0.7 - 3.1 x10E3/uL   Monocytes Absolute 0.5 0.1 - 0.9 x10E3/uL   EOS (ABSOLUTE) 0.2 0.0 - 0.4 x10E3/uL   Basophils Absolute 0.1 0.0 - 0.2 x10E3/uL   Immature Granulocytes 0 Not Estab. %   Immature Grans (Abs) 0.0 0.0 - 0.1 x10E3/uL  Microalbumin, Urine Waived  Result Value Ref Range   Microalb, Ur Waived 10 0 - 19 mg/L   Creatinine, Urine Waived 50 10 - 300 mg/dL   Microalb/Creat Ratio <30 <30 mg/g  Lipid Panel Piccolo, Waived  Result Value Ref Range   Cholesterol Piccolo, Waived 151 <200 mg/dL   HDL Chol Piccolo, Waived 49 (L) >59 mg/dL   Triglycerides Piccolo,Waived 139 <150 mg/dL   Chol/HDL Ratio Piccolo,Waive 3.1 mg/dL   LDL Chol Calc Piccolo Waived 74 <100 mg/dL   VLDL Chol Calc Piccolo,Waive 28 <30 mg/dL  Comprehensive metabolic panel  Result Value Ref Range   Glucose 158 (H) 65 - 99 mg/dL   BUN 21 8 - 27 mg/dL   Creatinine, Ser 9.14 (H) 0.76 - 1.27 mg/dL   GFR calc non Af Amer 47 (L) >59 mL/min/1.73   GFR calc Af Amer 54 (L) >59 mL/min/1.73   BUN/Creatinine Ratio 14 10 - 24   Sodium 138 134 - 144 mmol/L   Potassium 4.4 3.5 - 5.2 mmol/L   Chloride 100 96 - 106 mmol/L   CO2 23 20 - 29 mmol/L   Calcium 9.0 8.6 - 10.2 mg/dL   Total Protein 6.9 6.0 - 8.5 g/dL   Albumin 4.4 3.7 - 4.7 g/dL   Globulin, Total 2.5 1.5 - 4.5 g/dL   Albumin/Globulin Ratio 1.8 1.2 -  2.2   Bilirubin Total 0.6 0.0 - 1.2 mg/dL   Alkaline Phosphatase 88 39 - 117 IU/L   AST 16 0 - 40  IU/L   ALT 11 0 - 44 IU/L  Bayer DCA Hb A1c Waived  Result Value Ref Range   HB A1C (BAYER DCA - WAIVED) 7.3 (H) <7.0 %      Assessment & Plan:   Problem List Items Addressed This Visit      Nervous and Auditory   Cerumen impaction    Acute, L>R.  Irrigation performed with benefit to right side, but continues to have some impaction left side.  Recommend he place Debrox drops daily over next 5 days and then return on Monday for repeat irrigation.  Educated on Debrox and use, to have soften cerumen.  He agrees with this plan of care.  Will refer to ENT if ongoing issues.            Follow up plan: Return in about 5 days (around 09/10/2019) for Cerumen impaction.

## 2019-09-10 ENCOUNTER — Ambulatory Visit: Payer: Medicare Other | Admitting: Nurse Practitioner

## 2019-09-18 ENCOUNTER — Other Ambulatory Visit: Payer: Self-pay | Admitting: Nurse Practitioner

## 2019-09-19 DIAGNOSIS — E119 Type 2 diabetes mellitus without complications: Secondary | ICD-10-CM | POA: Diagnosis not present

## 2019-09-19 LAB — HM DIABETES EYE EXAM

## 2019-09-25 ENCOUNTER — Ambulatory Visit: Payer: Medicare Other | Attending: Internal Medicine

## 2019-09-25 DIAGNOSIS — Z23 Encounter for immunization: Secondary | ICD-10-CM

## 2019-09-25 NOTE — Progress Notes (Signed)
   Covid-19 Vaccination Clinic  Name:  Jonathan [name]    MRN: [id number] DOB: 1945/07/01  09/25/2019  [name] was observed post Covid-19 immunization for 15 minutes without incident. He was provided with Vaccine Information Sheet and instruction to access the V-Safe system.   [name] was instructed to call 911 with any severe reactions post vaccine: Marland Kitchen Difficulty breathing  . Swelling of face and throat  . A fast heartbeat  . A bad rash all over body  . Dizziness and weakness   Immunizations Administered    Name Date Dose VIS Date Route   Pfizer COVID-19 Vaccine 09/25/2019  9:05 AM 0.3 mL 07/18/2018 Intramuscular   Manufacturer: [employer], [employer]   Lot: [id number]   NDC: [address]-9

## 2019-10-05 ENCOUNTER — Other Ambulatory Visit: Payer: Self-pay

## 2019-10-05 ENCOUNTER — Encounter: Payer: Self-pay | Admitting: Nurse Practitioner

## 2019-10-05 ENCOUNTER — Ambulatory Visit (INDEPENDENT_AMBULATORY_CARE_PROVIDER_SITE_OTHER): Payer: Medicare Other | Admitting: Nurse Practitioner

## 2019-10-05 VITALS — BP 99/63 | HR 80 | Temp 97.6°F | Wt 209.4 lb

## 2019-10-05 DIAGNOSIS — I152 Hypertension secondary to endocrine disorders: Secondary | ICD-10-CM

## 2019-10-05 DIAGNOSIS — E785 Hyperlipidemia, unspecified: Secondary | ICD-10-CM

## 2019-10-05 DIAGNOSIS — E1159 Type 2 diabetes mellitus with other circulatory complications: Secondary | ICD-10-CM | POA: Diagnosis not present

## 2019-10-05 DIAGNOSIS — D696 Thrombocytopenia, unspecified: Secondary | ICD-10-CM

## 2019-10-05 DIAGNOSIS — E6609 Other obesity due to excess calories: Secondary | ICD-10-CM

## 2019-10-05 DIAGNOSIS — I10 Essential (primary) hypertension: Secondary | ICD-10-CM | POA: Diagnosis not present

## 2019-10-05 DIAGNOSIS — E1121 Type 2 diabetes mellitus with diabetic nephropathy: Secondary | ICD-10-CM

## 2019-10-05 DIAGNOSIS — I480 Paroxysmal atrial fibrillation: Secondary | ICD-10-CM | POA: Diagnosis not present

## 2019-10-05 DIAGNOSIS — N1832 Chronic kidney disease, stage 3b: Secondary | ICD-10-CM | POA: Diagnosis not present

## 2019-10-05 DIAGNOSIS — Z6830 Body mass index (BMI) 30.0-30.9, adult: Secondary | ICD-10-CM | POA: Diagnosis not present

## 2019-10-05 DIAGNOSIS — E1169 Type 2 diabetes mellitus with other specified complication: Secondary | ICD-10-CM

## 2019-10-05 LAB — BAYER DCA HB A1C WAIVED: HB A1C (BAYER DCA - WAIVED): 7.5 % — ABNORMAL HIGH (ref <7.0)

## 2019-10-05 MED ORDER — TRULICITY 3 MG/0.5ML ~~LOC~~ SOAJ: 3.0000 mg | SUBCUTANEOUS | 5 refills | Status: DC

## 2019-10-05 NOTE — Assessment & Plan Note (Signed)
Chronic, ongoing.  Rate controlled.  Continue current medication regimen and collaboration with cardiology.  No current anti-coagulant.

## 2019-10-05 NOTE — Assessment & Plan Note (Signed)
Chronic, ongoing.  Continue with current regimen and adjust as needed based on labs.  Lipid panel next visit, recent LDL 74.

## 2019-10-05 NOTE — Patient Instructions (Signed)
Stop Januvia and start Trulicity 3 MG (may use two of your 1.5 MG pens a week until out of them)  Diabetes Mellitus and Nutrition, Adult When you have diabetes (diabetes mellitus), it is very important to have healthy eating habits because your blood sugar (glucose) levels are greatly affected by what you eat and drink. Eating healthy foods in the appropriate amounts, at about the same times every day, can help you:  Control your blood glucose.  Lower your risk of heart disease.  Improve your blood pressure.  Reach or maintain a healthy weight. Every person with diabetes is different, and each person has different needs for a meal plan. Your health care provider may recommend that you work with a diet and nutrition specialist (dietitian) to make a meal plan that is best for you. Your meal plan may vary depending on factors such as:  The calories you need.  The medicines you take.  Your weight.  Your blood glucose, blood pressure, and cholesterol levels.  Your activity level.  Other health conditions you have, such as heart or kidney disease. How do carbohydrates affect me? Carbohydrates, also called carbs, affect your blood glucose level more than any other type of food. Eating carbs naturally raises the amount of glucose in your blood. Carb counting is a method for keeping track of how many carbs you eat. Counting carbs is important to keep your blood glucose at a healthy level, especially if you use insulin or take certain oral diabetes medicines. It is important to know how many carbs you can safely have in each meal. This is different for every person. Your dietitian can help you calculate how many carbs you should have at each meal and for each snack. Foods that contain carbs include:  Bread, cereal, rice, pasta, and crackers.  Potatoes and corn.  Peas, beans, and lentils.  Milk and yogurt.  Fruit and juice.  Desserts, such as cakes, cookies, ice cream, and candy. How  does alcohol affect me? Alcohol can cause a sudden decrease in blood glucose (hypoglycemia), especially if you use insulin or take certain oral diabetes medicines. Hypoglycemia can be a life-threatening condition. Symptoms of hypoglycemia (sleepiness, dizziness, and confusion) are similar to symptoms of having too much alcohol. If your health care provider says that alcohol is safe for you, follow these guidelines:  Limit alcohol intake to no more than 1 drink per day for nonpregnant women and 2 drinks per day for men. One drink equals 12 oz of beer, 5 oz of wine, or 1 oz of hard liquor.  Do not drink on an empty stomach.  Keep yourself hydrated with water, diet soda, or unsweetened iced tea.  Keep in mind that regular soda, juice, and other mixers may contain a lot of sugar and must be counted as carbs. What are tips for following this plan?  Reading food labels  Start by checking the serving size on the "Nutrition Facts" label of packaged foods and drinks. The amount of calories, carbs, fats, and other nutrients listed on the label is based on one serving of the item. Many items contain more than one serving per package.  Check the total grams (g) of carbs in one serving. You can calculate the number of servings of carbs in one serving by dividing the total carbs by 15. For example, if a food has 30 g of total carbs, it would be equal to 2 servings of carbs.  Check the number of grams (g) of saturated  and trans fats in one serving. Choose foods that have low or no amount of these fats.  Check the number of milligrams (mg) of salt (sodium) in one serving. Most people should limit total sodium intake to less than 2,300 mg per day.  Always check the nutrition information of foods labeled as "low-fat" or "nonfat". These foods may be higher in added sugar or refined carbs and should be avoided.  Talk to your dietitian to identify your daily goals for nutrients listed on the  label. Shopping  Avoid buying canned, premade, or processed foods. These foods tend to be high in fat, sodium, and added sugar.  Shop around the outside edge of the grocery store. This includes fresh fruits and vegetables, bulk grains, fresh meats, and fresh dairy. Cooking  Use low-heat cooking methods, such as baking, instead of high-heat cooking methods like deep frying.  Cook using healthy oils, such as olive, canola, or sunflower oil.  Avoid cooking with butter, cream, or high-fat meats. Meal planning  Eat meals and snacks regularly, preferably at the same times every day. Avoid going long periods of time without eating.  Eat foods high in fiber, such as fresh fruits, vegetables, beans, and whole grains. Talk to your dietitian about how many servings of carbs you can eat at each meal.  Eat 4-6 ounces (oz) of lean protein each day, such as lean meat, chicken, fish, eggs, or tofu. One oz of lean protein is equal to: ? 1 oz of meat, chicken, or fish. ? 1 egg. ?  cup of tofu.  Eat some foods each day that contain healthy fats, such as avocado, nuts, seeds, and fish. Lifestyle  Check your blood glucose regularly.  Exercise regularly as told by your health care provider. This may include: ? 150 minutes of moderate-intensity or vigorous-intensity exercise each week. This could be brisk walking, biking, or water aerobics. ? Stretching and doing strength exercises, such as yoga or weightlifting, at least 2 times a week.  Take medicines as told by your health care provider.  Do not use any products that contain nicotine or tobacco, such as cigarettes and e-cigarettes. If you need help quitting, ask your health care provider.  Work with a Social worker or diabetes educator to identify strategies to manage stress and any emotional and social challenges. Questions to ask a health care provider  Do I need to meet with a diabetes educator?  Do I need to meet with a dietitian?  What  number can I call if I have questions?  When are the best times to check my blood glucose? Where to find more information:  American Diabetes Association: diabetes.org  Academy of Nutrition and Dietetics: www.eatright.[employer] of Diabetes and Digestive and Kidney Diseases (NIH): [employer] Summary  A healthy meal plan will help you control your blood glucose and maintain a healthy lifestyle.  Working with a diet and nutrition specialist (dietitian) can help you make a meal plan that is best for you.  Keep in mind that carbohydrates (carbs) and alcohol have immediate effects on your blood glucose levels. It is important to count carbs and to use alcohol carefully. This information is not intended to replace advice given to you by your health care provider. Make sure you discuss any questions you have with your health care provider. Document Revised: 04/22/2017 Document Reviewed: 06/14/2016 Elsevier Patient Education  2020 [employer].

## 2019-10-05 NOTE — Assessment & Plan Note (Signed)
Chronic, ongoing.  A1C 7.3% today.  Increase Trulicity to 3 MG weekly (he is to use 2 of his 1.5 MG syringes a week until all gone and then switch to 3 MG pens) + continue Metformin 500 MG BID.  Will discontinue Januvia due to similar function to Trulicity, will increase Trulicity to 4.5 MG if ongoing elevations BS or A1C.   Renal function limits medications that can be used.  Recommend he check blood sugar twice a day, in morning fasting and 2 hours after a meal, document these.  Provided information to him on CCM team, he wishes to wait on referral.  Return in 4 weeks.

## 2019-10-05 NOTE — Progress Notes (Signed)
BP 99/63   Pulse 80   Temp 97.6 F (36.4 C) (Oral)   Wt 209 lb 6.4 oz (95 kg)   SpO2 98%   BMI 30.05 kg/m    Subjective:    Patient ID: Jonathan [name], male    DOB: [birth year], 74 y.o.   MRN: [id number]  HPI: Jonathan [name] is a 74 y.o. male  Chief Complaint  Patient presents with  . Diabetes  . Hypertension  . Hyperlipidemia   DIABETES Last A1C was 7.3% in February. Continues Januvia 50 MG daily, renal dosed. Continues on Trulicity 1.5 MG weekly and Metformin 500 MG BID. Reports he has been trying to lose weight and focused on diet.  Tried Invokana, but was discontinued in past due to kidney function. Hypoglycemic episodes:no Polydipsia/polyuria: no Visual disturbance: no Chest pain: no Paresthesias: no Glucose Monitoring: yes             Accucheck frequency: weekly             Fasting glucose: 150 in morning             Post prandial:             Evening: 110-120             Before meals: Taking Insulin?: no             Long acting insulin:             Short acting insulin: Blood Pressure Monitoring: daily Retinal Examination: not up to date Foot Exam: Up to Date Pneumovax: Up to Date Influenza: Up to Date Aspirin: no   HYPERTENSION / HYPERLIPIDEMIA WITH CKD & AFIB Continues on Lisinopril 20 MG, Metoprolol 50 MG BID, and Atorvastatin 80 MG. He goes to nephrology, but has been stable with kidney function for the past year in CKD 3, February GFR 47 and CRT 1.46. Sees Dr. Gwen Pounds for cardiology, last saw 03/05/2019 with no changes made.  Had seen Dr. Maisie Fus from EP about left atrial appendage closure device, but has opted not to do this at this time.  Last EF was 50% with mild LVH.  Had been on Pradexa in past prior to ablation for a-fib.  History of low PLT on labs, 149 last check.  Denies any bruising or bleeding.  Seen by hematology for this last in 2019. Satisfied with current treatment? yes Duration of hypertension: chronic BP monitoring frequency: daily BP  range: 116-117/70 BP medication side effects: no Duration of hyperlipidemia: chronic Cholesterol medication side effects: no Cholesterol supplements: none Medication compliance: good compliance Aspirin: no Recent stressors: no Recurrent headaches: no Visual changes: no Palpitations: no Dyspnea: no Chest pain: no Lower extremity edema: no Dizzy/lightheaded: no   Relevant past medical, surgical, family and social history reviewed and updated as indicated. Interim medical history since our last visit reviewed. Allergies and medications reviewed and updated.  Review of Systems  Constitutional: Negative for activity change, diaphoresis, fatigue and fever.  Respiratory: Negative for cough, chest tightness, shortness of breath and wheezing.   Cardiovascular: Negative for chest pain, palpitations and leg swelling.  Gastrointestinal: Negative.   Endocrine: Negative for cold intolerance, heat intolerance, polydipsia, polyphagia and polyuria.  Neurological: Negative.   Psychiatric/Behavioral: Negative.     Per HPI unless specifically indicated above     Objective:    BP 99/63   Pulse 80   Temp 97.6 F (36.4 C) (Oral)   Wt 209 lb 6.4 oz (95 kg)  SpO2 98%   BMI 30.05 kg/m   Wt Readings from Last 3 Encounters:  10/05/19 209 lb 6.4 oz (95 kg)  07/04/19 208 lb (94.3 kg)  05/21/19 210 lb (95.3 kg)    Physical Exam Vitals and nursing note reviewed.  Constitutional:      General: He is awake. He is not in acute distress.    Appearance: He is well-developed. He is not ill-appearing.  HENT:     Head: Normocephalic.     Right Ear: Hearing normal. No drainage.     Left Ear: Hearing normal. No drainage.  Eyes:     General: Lids are normal.        Right eye: No discharge.        Left eye: No discharge.     Conjunctiva/sclera: Conjunctivae normal.  Pulmonary:     Effort: Pulmonary effort is normal. No accessory muscle usage or respiratory distress.  Musculoskeletal:      Cervical back: Normal range of motion.  Neurological:     Mental Status: He is alert and oriented to person, place, and time.  Psychiatric:        Attention and Perception: Attention normal.        Mood and Affect: Mood normal.        Speech: Speech normal.        Behavior: Behavior normal. Behavior is cooperative.        Thought Content: Thought content normal.    Results for orders placed or performed in visit on 10/05/19  Bayer DCA Hb A1c Waived  Result Value Ref Range   HB A1C (BAYER DCA - WAIVED) 7.5 (H) <7.0 %      Assessment & Plan:   Problem List Items Addressed This Visit      Cardiovascular and Mediastinum   Hypertension associated with diabetes (HCC)    Chronic, ongoing.  Continue Lisinopril for kidney protection, however due to tight control will trial decreasing to 10 MG daily (he will start taking 1/2 tablet at home and notify provider how BP is doing, if elevation return to 20 MG).  Continue remainder of current medication regimen and collaboration with cardiology and nephrology.  BP at goal on home readings, continue to monitor these.  Return in 3 months.       Relevant Medications   Dulaglutide (TRULICITY) 3 MG/0.5ML SOPN   Paroxysmal A-fib (HCC)    Chronic, ongoing.  Rate controlled.  Continue current medication regimen and collaboration with cardiology.  No current anti-coagulant.          Endocrine   Hyperlipidemia associated with type 2 diabetes mellitus (HCC)    Chronic, ongoing.  Continue with current regimen and adjust as needed based on labs.  Lipid panel next visit, recent LDL 74.      Relevant Medications   Dulaglutide (TRULICITY) 3 MG/0.5ML SOPN   Type 2 diabetes mellitus with chronic kidney disease, without long-term current use of insulin (HCC) - Primary    Chronic, ongoing.  A1C 7.3% today.  Increase Trulicity to 3 MG weekly (he is to use 2 of his 1.5 MG syringes a week until all gone and then switch to 3 MG pens) + continue Metformin 500 MG  BID.  Will discontinue Januvia due to similar function to Trulicity, will increase Trulicity to 4.5 MG if ongoing elevations BS or A1C.   Renal function limits medications that can be used.  Recommend he check blood sugar twice a day, in morning fasting and 2  hours after a meal, document these.  Provided information to him on CCM team, he wishes to wait on referral.  Return in 4 weeks.      Relevant Medications   Dulaglutide (TRULICITY) 3 QV/9.5GL SOPN   Other Relevant Orders   Bayer DCA Hb A1c Waived (Completed)     Genitourinary   CKD (chronic kidney disease), stage III    Chronic, ongoing.  Continue to collaborate with nephrology and renal dose medication, Lisinopril for kidney protection.  BMP next visit.        Other   Obesity    Recommended eating smaller high protein, low fat meals more frequently and exercising 30 mins a day 5 times a week with a goal of 10-15lb weight loss in the next 3 months. Patient voiced their understanding and motivation to adhere to these recommendations.       Relevant Medications   Dulaglutide (TRULICITY) 3 OV/5.6EP SOPN   Thrombocytopenia (HCC)    Recheck CBC every 6 months (done recently), no current symptoms.  Previously followed by Dr. Tish Men.          Follow up plan: Return in about 4 weeks (around 11/02/2019) for T2DM.

## 2019-10-05 NOTE — Assessment & Plan Note (Signed)
Recheck CBC every 6 months (done recently), no current symptoms.  Previously followed by Dr. Kizzie Bane.

## 2019-10-05 NOTE — Assessment & Plan Note (Signed)
Chronic, ongoing.  Continue Lisinopril for kidney protection, however due to tight control will trial decreasing to 10 MG daily (he will start taking 1/2 tablet at home and notify provider how BP is doing, if elevation return to 20 MG).  Continue remainder of current medication regimen and collaboration with cardiology and nephrology.  BP at goal on home readings, continue to monitor these.  Return in 3 months.

## 2019-10-05 NOTE — Assessment & Plan Note (Signed)
Chronic, ongoing.  Continue to collaborate with nephrology and renal dose medication, Lisinopril for kidney protection.  BMP next visit.

## 2019-10-05 NOTE — Assessment & Plan Note (Signed)
Recommended eating smaller high protein, low fat meals more frequently and exercising 30 mins a day 5 times a week with a goal of 10-15lb weight loss in the next 3 months. Patient voiced their understanding and motivation to adhere to these recommendations.  

## 2019-10-06 ENCOUNTER — Other Ambulatory Visit: Payer: Self-pay | Admitting: Nurse Practitioner

## 2019-10-06 NOTE — Telephone Encounter (Signed)
Requested Prescriptions  Pending Prescriptions Disp Refills  . levothyroxine (SYNTHROID) 125 MCG tablet [Pharmacy Med Name: LEVOTHYROXINE 125 MCG TABLET] 90 tablet 2    Sig: TAKE 1 TABLET BY MOUTH EVERY DAY     Endocrinology:  Hypothyroid Agents Failed - 10/06/2019 10:01 AM      Failed - TSH needs to be rechecked within 3 months after an abnormal result. Refill until TSH is due.      Passed - TSH in normal range and within 360 days    TSH  Date Value Ref Range Status  07/13/2019 1.080 0.450 - 4.500 uIU/mL Final         Passed - Valid encounter within last 12 months    Recent Outpatient Visits          Yesterday Type 2 diabetes mellitus with stage 3b chronic kidney disease, without long-term current use of insulin (HCC)   [facility] [place], [place] T, NP   1 month ago Impacted cerumen of left ear   [facility] [place], Jolene T, NP   3 months ago Type 2 diabetes mellitus with stage 3b chronic kidney disease, without long-term current use of insulin (HCC)   [facility] [place], [place] T, NP   4 months ago Viral upper respiratory tract infection   [facility] Mardene Celeste I, NP   8 months ago Type 2 diabetes mellitus with stage 3 chronic kidney disease, without long-term current use of insulin (HCC)   [facility] [place], Dorie Rank, NP      Future Appointments            In 1 month [place], Dorie Rank, NP [facility]   In 7 months  [facility]

## 2019-10-29 ENCOUNTER — Other Ambulatory Visit: Payer: Self-pay | Admitting: Nurse Practitioner

## 2019-10-29 DIAGNOSIS — I480 Paroxysmal atrial fibrillation: Secondary | ICD-10-CM | POA: Diagnosis not present

## 2019-10-29 DIAGNOSIS — E782 Mixed hyperlipidemia: Secondary | ICD-10-CM | POA: Diagnosis not present

## 2019-10-29 DIAGNOSIS — I10 Essential (primary) hypertension: Secondary | ICD-10-CM | POA: Diagnosis not present

## 2019-10-29 MED ORDER — TRULICITY 3 MG/0.5ML ~~LOC~~ SOAJ: 3.0000 mg | SUBCUTANEOUS | 5 refills | Status: DC

## 2019-11-06 ENCOUNTER — Ambulatory Visit (INDEPENDENT_AMBULATORY_CARE_PROVIDER_SITE_OTHER): Payer: Medicare Other | Admitting: Pharmacist

## 2019-11-06 ENCOUNTER — Encounter: Payer: Self-pay | Admitting: Nurse Practitioner

## 2019-11-06 ENCOUNTER — Ambulatory Visit (INDEPENDENT_AMBULATORY_CARE_PROVIDER_SITE_OTHER): Payer: Medicare Other | Admitting: Nurse Practitioner

## 2019-11-06 ENCOUNTER — Other Ambulatory Visit: Payer: Self-pay

## 2019-11-06 VITALS — BP 115/68 | HR 100 | Temp 97.5°F | Wt 208.8 lb

## 2019-11-06 DIAGNOSIS — E1121 Type 2 diabetes mellitus with diabetic nephropathy: Secondary | ICD-10-CM

## 2019-11-06 DIAGNOSIS — N1832 Chronic kidney disease, stage 3b: Secondary | ICD-10-CM

## 2019-11-06 DIAGNOSIS — I480 Paroxysmal atrial fibrillation: Secondary | ICD-10-CM | POA: Diagnosis not present

## 2019-11-06 DIAGNOSIS — E1122 Type 2 diabetes mellitus with diabetic chronic kidney disease: Secondary | ICD-10-CM

## 2019-11-06 DIAGNOSIS — I6523 Occlusion and stenosis of bilateral carotid arteries: Secondary | ICD-10-CM

## 2019-11-06 NOTE — Patient Instructions (Signed)
Visit Information  Goals Addressed              This Visit's Progress     Patient Stated   .  PharmD "These medications are expensive" (pt-stated)        CARE PLAN ENTRY (see longitudinal plan of care for additional care plan information)  Current Barriers:  . Polypharmacy; complex patient with multiple comorbidities including T2DM, atrial fibrillation, carotid artery stenosis, CKD, HLD, HTN . Reports cost concerns w/ his medications, particularly Trulicity and Eliquis. In the Medicare Coverage Gap currently, though thinks he will pay out of it in the next ~month . Most recent eGFR: ~47 mL/min o T2DM: last A1c 7.5%. Metformin 026 mg BID, Trulicity 3 mg weekly (increased w/ last A1c). o Afib: follows w/ Dr. Nehemiah Massed. More recurrent runs of afib recently. Follows up w/ [facility] this afternoon for EKG, consideration for DCCV. Eliquis 5 mg BID, flecainide 100 mg BID, metoprolol tartrate 100 mg BID, lisinopril 20 mg daily o ASCVD risk reduction/carotid stenosis: atorvastatin 80 mg daily, last LDL right at goal at 74 o Supplements: Vitamin D 2000 units daily, Vitamin B12 1000 units daily, MVI, mag oxide 400 mg   Pharmacist Clinical Goal(s):  Marland Kitchen Over the next 90 days, patient will work with PharmD and provider towards optimized medication management  Interventions: . Comprehensive medication review performed; medication list updated in electronic medical record . Inter-disciplinary care team collaboration (see longitudinal plan of care) . Reviewed patient's household income. Patient is over income for Eliquis and Trulicity programs.  . Provided information regarding Xarelto $85/month program. He will take this with him to cardiology appointment this afternoon and discuss benefits vs risk of changing therapy with them. May avoid switch right now given pending DCCV. Additionally, once patient pays through [facility] will be cheaper than $85/month  Patient Self Care Activities:    . Patient will take medications as prescribed  Initial goal documentation        Patient verbalizes understanding of instructions provided today.   Plan: - Scheduled f/u call in ~ 8 weeks  Catie Darnelle Maffucci, PharmD, [place] [phone]

## 2019-11-06 NOTE — Patient Instructions (Signed)

## 2019-11-06 NOTE — Progress Notes (Signed)
BP 115/68   Pulse 100 Comment: apical  Temp (!) 97.5 F (36.4 C) (Oral)   Wt 208 lb 12.8 oz (94.7 kg)   SpO2 94%   BMI 29.96 kg/m    Subjective:    Patient ID: Jonathan [name], male    DOB: Sep 21, 1945, 74 y.o.   MRN: [id number]  HPI: Jonathan [name] is a 74 y.o. male  Chief Complaint  Patient presents with  . Diabetes    4 week f/up   DIABETES Last A1C was 7.3% last visit. Continues on Trulicity 3 MG weekly and Metformin 500 MG BID, discontinued Januvia last visit due to similar function to Trulicity. Reports he has been trying to lose weight and focused on diet. Tried Invokana, but was discontinued in past due to kidney function. Hypoglycemic episodes:no Polydipsia/polyuria: no Visual disturbance: no Chest pain: no Paresthesias: no Glucose Monitoring: yes Accucheck frequency: weekly Fasting glucose: 125-130 in morning Post prandial: 150 after eating Evening:  Before meals: Taking Insulin?: no Long acting insulin: Short acting insulin: Blood Pressure Monitoring: daily Retinal Examination: not up to date Foot Exam: Up to Date Pneumovax: Up to Date Influenza: Up to Date Aspirin: no  ATRIAL FIBRILLATION Having recent episodes of return of a-fib, did ablation 9 years ago.  Has been following cardiology for this and last saw 11/01/19 -- Dr. Freida Busman, was placed on back on Eliquis + Tambocor 100 MG BID + Metoprolol 100 MG BID.  On review of note he returns to cardiology today -- if no improvement they are going to consider preparation for DCCV.    Had seen Dr. Maisie Fus from EP about left atrial appendage closure device, but has opted not to do this at this time. Last EF was 50% with mild LVH. Had been on Pradexa in past prior to ablation for a-fib, but did not tolerate this.  Atrial fibrillation status: uncontrolled Satisfied with current treatment: making him tired right now  Medication side  effects:  no Medication compliance: good compliance Etiology of atrial fibrillation:  Palpitations:  no Chest pain:  no Dyspnea on exertion:  no Orthopnea:  no Syncope:  no Edema:  no Ventricular rate control: B-blocker Anti-coagulation: long acting  Relevant past medical, surgical, family and social history reviewed and updated as indicated. Interim medical history since our last visit reviewed. Allergies and medications reviewed and updated.  Review of Systems  Constitutional: Negative for activity change, diaphoresis, fatigue and fever.  Respiratory: Negative for cough, chest tightness, shortness of breath and wheezing.   Cardiovascular: Negative for chest pain, palpitations and leg swelling.  Gastrointestinal: Negative.   Endocrine: Negative for cold intolerance, heat intolerance, polydipsia, polyphagia and polyuria.  Neurological: Negative.   Psychiatric/Behavioral: Negative.     Per HPI unless specifically indicated above     Objective:    BP 115/68   Pulse 100 Comment: apical  Temp (!) 97.5 F (36.4 C) (Oral)   Wt 208 lb 12.8 oz (94.7 kg)   SpO2 94%   BMI 29.96 kg/m   Wt Readings from Last 3 Encounters:  11/06/19 208 lb 12.8 oz (94.7 kg)  10/05/19 209 lb 6.4 oz (95 kg)  07/04/19 208 lb (94.3 kg)    Physical Exam Vitals and nursing note reviewed.  Constitutional:      General: He is awake. He is not in acute distress.    Appearance: He is well-developed, well-groomed and overweight. He is not ill-appearing.  HENT:     Head: Normocephalic and atraumatic.  Right Ear: Hearing normal. No drainage.     Left Ear: Hearing normal. No drainage.  Eyes:     General: Lids are normal.        Right eye: No discharge.        Left eye: No discharge.     Conjunctiva/sclera: Conjunctivae normal.     Pupils: Pupils are equal, round, and reactive to light.  Neck:     Trachea: Trachea normal.  Cardiovascular:     Rate and Rhythm: Tachycardia present. Rhythm  irregularly irregular.     Heart sounds: Normal heart sounds, S1 normal and S2 normal. No murmur heard.  No gallop.   Pulmonary:     Effort: Pulmonary effort is normal. No accessory muscle usage or respiratory distress.     Breath sounds: Normal breath sounds.  Abdominal:     General: Bowel sounds are normal.     Palpations: Abdomen is soft. There is no hepatomegaly or splenomegaly.  Musculoskeletal:        General: Normal range of motion.     Cervical back: Normal range of motion and neck supple.     Right lower leg: No edema.     Left lower leg: No edema.  Skin:    General: Skin is warm and dry.  Neurological:     Mental Status: He is alert and oriented to person, place, and time.  Psychiatric:        Attention and Perception: Attention normal.        Mood and Affect: Mood normal.        Speech: Speech normal.        Behavior: Behavior normal. Behavior is cooperative.        Thought Content: Thought content normal.     Results for orders placed or performed in visit on 10/05/19  Bayer DCA Hb A1c Waived  Result Value Ref Range   HB A1C (BAYER DCA - WAIVED) 7.5 (H) <7.0 %      Assessment & Plan:   Problem List Items Addressed This Visit      Cardiovascular and Mediastinum   Paroxysmal A-fib (HCC)    Chronic, sees cardiology this afternoon due to return of a-fib episodes.  Continue current medication regimen as prescribed by cardiology at this time and adjust as needed.  CCM referral to discuss costs with pharmacist.  Recent cardiology note reviewed and appears if poor control with current medication changes they are considering DCCV.  Will review note from today once available.      Relevant Medications   flecainide (TAMBOCOR) 50 MG tablet   apixaban (ELIQUIS) 5 MG TABS tablet   metoprolol tartrate (LOPRESSOR) 50 MG tablet   Other Relevant Orders   Referral to Chronic Care Management Services     Endocrine   Type 2 diabetes mellitus with chronic kidney disease,  without long-term current use of insulin (HCC) - Primary    Chronic, ongoing.  A1C 7.3% last visit.  Increased Trulicity to 3 MG weekly at last visit + continued Metformin 500 MG BID.  Discontinued Januvia last visit due to similar function to Trulicity, will increase Trulicity to 4.5 MG if ongoing elevations BS or A1C.   At this time sugars at home are improving with medication changes.  Renal function limits medications that can be used.  Recommend he check blood sugar twice a day, in morning fasting and 2 hours after a meal, document these.  CCM referral placed today.  Return in 2 months for  A1C check.      Relevant Orders   Referral to Chronic Care Management Services       Follow up plan: Return in about 2 months (around 01/06/2020) for T2DM, HTN/HLD, A-Fib.

## 2019-11-06 NOTE — Chronic Care Management (AMB) (Signed)
Chronic Care Management   Note  11/06/2019 Name: Jonathan [name] MRN: [id number] DOB: [birth year]   Subjective:  Jonathan [name] is a 74 y.o. year old male who is a primary care patient of Cannady, Barbaraann Faster, [name]. The CCM team was consulted for assistance with chronic disease management and care coordination needs.     Jonathan [name] was given information about Chronic Care Management services today including:  1. CCM service includes personalized support from designated clinical staff supervised by his physician, including individualized plan of care and coordination with other care providers 2. 24/7 contact phone numbers for assistance for urgent and routine care needs. 3. Service will only be billed when office clinical staff spend 20 minutes or more in a month to coordinate care. 4. Only one practitioner may furnish and bill the service in a calendar month. 5. The patient may stop CCM services at any time (effective at the end of the month) by phone call to the office staff. 6. The patient will be responsible for cost sharing (co-pay) of up to 20% of the service fee (after annual deductible is met).  Patient agreed to services and verbal consent obtained.   Review of patient status, including review of consultants reports, laboratory and other test data, was performed as part of comprehensive evaluation and provision of chronic care management services.   SDOH (Social Determinants of Health) assessments and interventions performed:  SDOH Interventions     Most Recent Value  SDOH Interventions  Financial Strain Interventions Other (Comment)  [patient assistance evaluation]     yes  Objective:  Lab Results  Component Value Date   CREATININE 1.46 (H) 07/13/2019   CREATININE 1.70 (H) 02/06/2019   CREATININE 1.47 (H) 11/01/2018    Lab Results  Component Value Date   HGBA1C 7.5 (H) 10/05/2019       Component Value Date/Time   CHOL 151 07/13/2019 0952   TRIG 139 07/13/2019 0952     HDL 48 02/06/2019 0958   VLDL 28 07/13/2019 0952   LDLCALC 78 02/06/2019 0958    Clinical ASCVD: No  The 10-year ASCVD risk score Jonathan Bussing DC Jr., et al., 2013) is: 36.1%   Values used to calculate the score:     Age: 10 years     Sex: Male     Is Non-Hispanic African American: No     Diabetic: Yes     Tobacco smoker: No     Systolic Blood Pressure: 191 mmHg     Is BP treated: Yes     HDL Cholesterol: 49 mg/dL     Total Cholesterol: 151 mg/dL    BP Readings from Last 3 Encounters:  11/06/19 115/68  10/05/19 99/63  09/05/19 137/83    No Known Allergies  Medications Reviewed Today    Reviewed by Jonathan [name] (Nurse Practitioner) on 11/06/19 at 84  Med List Status: <None>  Medication Order Taking? Sig Documenting Provider Last Dose Status Informant  apixaban (ELIQUIS) 5 MG TABS tablet [id number] Yes Take 1 tablet by mouth in the morning and at bedtime. [provider] Taking Active   atorvastatin (LIPITOR) 80 MG tablet [id number] Yes Take 1 tablet (80 mg total) by mouth daily. Jonathan [name] Taking Active   cholecalciferol (VITAMIN D) 1000 units tablet [id number] Yes Take 1,000 Units by mouth daily. [provider] Taking Active   Dulaglutide (TRULICITY) 3 HW/8.6HU SOPN [id number] Yes Inject 0.5 mLs (3 mg total) as directed once a week. [place],  Barbaraann Faster, [name] Taking Active   flecainide (TAMBOCOR) 50 MG tablet [id number] Yes Take 2 tablets by mouth in the morning and at bedtime. [provider] Taking Active   levothyroxine (SYNTHROID) 125 MCG tablet [id number] Yes TAKE 1 TABLET BY MOUTH EVERY DAY Cannady, Jonathan [name] Taking Active   lisinopril (ZESTRIL) 20 MG tablet [id number] Yes TAKE 1 TABLET BY MOUTH EVERY DAY Cannady, Jonathan [name] Taking Active   magnesium oxide (MAG-OX) 400 MG tablet [id number] Yes Take 1 tablet (400 mg total) by mouth daily. Jonathan [name] Taking Active   metFORMIN (GLUCOPHAGE) 500 MG tablet [id number] Yes TAKE 1 TABLET  (500 MG TOTAL) BY MOUTH 2 (TWO) TIMES DAILY WITH A MEAL. Jonathan [name] Taking Active   metoprolol tartrate (LOPRESSOR) 50 MG tablet [id number] Yes Take 2 tablets by mouth in the morning and at bedtime. [provider] Taking Active   Multiple Vitamin (MULTIVITAMIN) tablet [id number] Yes Take 1 tablet by mouth daily. [provider] Taking Active   ONE TOUCH ULTRA TEST test strip [id number] Yes USE TO CHECK SUGAR TWICE DAILY Jonathan [name] Taking Active   vitamin B-12 (CYANOCOBALAMIN) 1000 MCG tablet [id number] Yes Take 1,000 mcg by mouth daily. Every other day [provider] Taking Active            Assessment:   Goals Addressed              This Visit's Progress     Patient Stated   .  PharmD "These medications are expensive" (pt-stated)        CARE PLAN ENTRY (see longitudinal plan of care for additional care plan information)  Current Barriers:  . Polypharmacy; complex patient with multiple comorbidities including T2DM, atrial fibrillation, carotid artery stenosis, CKD, HLD, HTN . Reports cost concerns w/ his medications, particularly Trulicity and Eliquis. In the Medicare Coverage Gap currently, though thinks he will pay out of it in the next ~month . Most recent eGFR: ~47 mL/min o T2DM: last A1c 7.5%. Metformin 353 mg BID, Trulicity 3 mg weekly (increased w/ last A1c). o Afib: follows w/ Jonathan [name]. More recurrent runs of afib recently. Follows up w/ [facility] this afternoon for EKG, consideration for DCCV. Eliquis 5 mg BID, flecainide 100 mg BID, metoprolol tartrate 100 mg BID, lisinopril 20 mg daily o ASCVD risk reduction/carotid stenosis: atorvastatin 80 mg daily, last LDL right at goal at 74 o Supplements: Vitamin D 2000 units daily, Vitamin B12 1000 units daily, MVI, mag oxide 400 mg   Pharmacist Clinical Goal(s):  Marland Kitchen Over the next 90 days, patient will work with PharmD and provider towards optimized medication  management  Interventions: . Comprehensive medication review performed; medication list updated in electronic medical record . Inter-disciplinary care team collaboration (see longitudinal plan of care) . Reviewed patient's household income. Patient is over income for Eliquis and Trulicity programs.  . Provided information regarding Xarelto $85/month program. He will take this with him to cardiology appointment this afternoon and discuss benefits vs risk of changing therapy with them. May avoid switch right now given pending DCCV. Additionally, once patient pays through [facility] will be cheaper than $85/month  Patient Self Care Activities:  . Patient will take medications as prescribed  Initial goal documentation        Plan: - Scheduled f/u call in ~ 8 weeks  Catie Darnelle Maffucci, PharmD, [place] [phone]

## 2019-11-06 NOTE — Assessment & Plan Note (Signed)
Chronic, sees cardiology this afternoon due to return of a-fib episodes.  Continue current medication regimen as prescribed by cardiology at this time and adjust as needed.  CCM referral to discuss costs with pharmacist.  Recent cardiology note reviewed and appears if poor control with current medication changes they are considering DCCV.  Will review note from today once available.

## 2019-11-06 NOTE — Assessment & Plan Note (Signed)
Chronic, ongoing.  A1C 7.3% last visit.  Increased Trulicity to 3 MG weekly at last visit + continued Metformin 500 MG BID.  Discontinued Januvia last visit due to similar function to Trulicity, will increase Trulicity to 4.5 MG if ongoing elevations BS or A1C.   At this time sugars at home are improving with medication changes.  Renal function limits medications that can be used.  Recommend he check blood sugar twice a day, in morning fasting and 2 hours after a meal, document these.  CCM referral placed today.  Return in 2 months for A1C check.

## 2019-11-20 ENCOUNTER — Other Ambulatory Visit: Payer: Self-pay

## 2019-11-20 ENCOUNTER — Other Ambulatory Visit
Admission: RE | Admit: 2019-11-20 | Discharge: 2019-11-20 | Disposition: A | Discharge status: Home / Self Care | Payer: Medicare Other | Source: Ambulatory Visit | Attending: Vascular Surgery | Admitting: Vascular Surgery

## 2019-11-20 DIAGNOSIS — Z01812 Encounter for preprocedural laboratory examination: Secondary | ICD-10-CM | POA: Insufficient documentation

## 2019-11-20 DIAGNOSIS — Z20822 Contact with and (suspected) exposure to covid-19: Secondary | ICD-10-CM | POA: Diagnosis not present

## 2019-11-20 LAB — SARS CORONAVIRUS 2 (TAT 6-24 HRS): SARS Coronavirus 2: NEGATIVE

## 2019-11-22 ENCOUNTER — Ambulatory Visit: Payer: Medicare Other | Admitting: Anesthesiology

## 2019-11-22 ENCOUNTER — Ambulatory Visit
Admission: RE | Admit: 2019-11-22 | Discharge: 2019-11-22 | Disposition: A | Discharge status: Home / Self Care | Payer: Medicare Other | Source: Ambulatory Visit | Attending: Internal Medicine | Admitting: Internal Medicine

## 2019-11-22 ENCOUNTER — Encounter
Admission: RE | Disposition: A | Discharge status: Home / Self Care | Payer: Self-pay | Source: Ambulatory Visit | Attending: Internal Medicine

## 2019-11-22 ENCOUNTER — Encounter: Payer: Self-pay | Admitting: Internal Medicine

## 2019-11-22 ENCOUNTER — Other Ambulatory Visit: Payer: Self-pay

## 2019-11-22 DIAGNOSIS — Z794 Long term (current) use of insulin: Secondary | ICD-10-CM | POA: Insufficient documentation

## 2019-11-22 DIAGNOSIS — I2510 Atherosclerotic heart disease of native coronary artery without angina pectoris: Secondary | ICD-10-CM | POA: Insufficient documentation

## 2019-11-22 DIAGNOSIS — Z8616 Personal history of COVID-19: Secondary | ICD-10-CM | POA: Diagnosis not present

## 2019-11-22 DIAGNOSIS — I6523 Occlusion and stenosis of bilateral carotid arteries: Secondary | ICD-10-CM | POA: Diagnosis not present

## 2019-11-22 DIAGNOSIS — Z7982 Long term (current) use of aspirin: Secondary | ICD-10-CM | POA: Diagnosis not present

## 2019-11-22 DIAGNOSIS — Z79899 Other long term (current) drug therapy: Secondary | ICD-10-CM | POA: Diagnosis not present

## 2019-11-22 DIAGNOSIS — Z7901 Long term (current) use of anticoagulants: Secondary | ICD-10-CM | POA: Diagnosis not present

## 2019-11-22 DIAGNOSIS — I10 Essential (primary) hypertension: Secondary | ICD-10-CM | POA: Diagnosis not present

## 2019-11-22 DIAGNOSIS — Z87891 Personal history of nicotine dependence: Secondary | ICD-10-CM | POA: Diagnosis not present

## 2019-11-22 DIAGNOSIS — Z79890 Hormone replacement therapy (postmenopausal): Secondary | ICD-10-CM | POA: Insufficient documentation

## 2019-11-22 DIAGNOSIS — I480 Paroxysmal atrial fibrillation: Secondary | ICD-10-CM | POA: Insufficient documentation

## 2019-11-22 DIAGNOSIS — E785 Hyperlipidemia, unspecified: Secondary | ICD-10-CM | POA: Diagnosis not present

## 2019-11-22 HISTORY — PX: CARDIOVERSION: SHX1299

## 2019-11-22 SURGERY — CARDIOVERSION
Anesthesia: General

## 2019-11-22 MED ORDER — SODIUM CHLORIDE 0.9 % IV SOLN: INTRAVENOUS | Status: DC | PRN

## 2019-11-22 MED ORDER — ONDANSETRON HCL 4 MG/2ML IJ SOLN: 4.0000 mg | Freq: Once | INTRAMUSCULAR | Status: DC | PRN

## 2019-11-22 MED ORDER — PROPOFOL 10 MG/ML IV BOLUS
INTRAVENOUS | Status: AC
  Filled 2019-11-22: qty 20

## 2019-11-22 MED ORDER — PROPOFOL 10 MG/ML IV BOLUS
INTRAVENOUS | Status: DC | PRN
  Administered 2019-11-22: 50 mg via INTRAVENOUS

## 2019-11-22 NOTE — Discharge Instructions (Signed)
Electrical Cardioversion Electrical cardioversion is the delivery of a jolt of electricity to restore a normal rhythm to the heart. A rhythm that is too fast or is not regular keeps the heart from pumping well. In this procedure, sticky patches or metal paddles are placed on the chest to deliver electricity to the heart from a device. This procedure may be done in an emergency if:  There is low or no blood pressure as a result of the heart rhythm.  Normal rhythm must be restored as fast as possible to protect the brain and heart from further damage.  It may save a life. This may also be a scheduled procedure for irregular or fast heart rhythms that are not immediately life-threatening. Tell a health care provider about:  Any allergies you have.  All medicines you are taking, including vitamins, herbs, eye drops, creams, and over-the-counter medicines.  Any problems you or family members have had with anesthetic medicines.  Any blood disorders you have.  Any surgeries you have had.  Any medical conditions you have.  Whether you are pregnant or may be pregnant. What are the risks? Generally, this is a safe procedure. However, problems may occur, including:  Allergic reactions to medicines.  A blood clot that breaks free and travels to other parts of your body.  The possible return of an abnormal heart rhythm within hours or days after the procedure.  Your heart stopping (cardiac arrest). This is rare. What happens before the procedure? Medicines  Your health care provider may have you start taking: ? Blood-thinning medicines (anticoagulants) so your blood does not clot as easily. ? Medicines to help stabilize your heart rate and rhythm.  Ask your health care provider about: ? Changing or stopping your regular medicines. This is especially important if you are taking diabetes medicines or blood thinners. ? Taking medicines such as aspirin and ibuprofen. These medicines can  thin your blood. Do not take these medicines unless your health care provider tells you to take them. ? Taking over-the-counter medicines, vitamins, herbs, and supplements. General instructions  Follow instructions from your health care provider about eating or drinking restrictions.  Plan to have someone take you home from the hospital or clinic.  If you will be going home right after the procedure, plan to have someone with you for 24 hours.  Ask your health care provider what steps will be taken to help prevent infection. These may include washing your skin with a germ-killing soap. What happens during the procedure?   An IV will be inserted into one of your veins.  Sticky patches (electrodes) or metal paddles may be placed on your chest.  You will be given a medicine to help you relax (sedative).  An electrical shock will be delivered. The procedure may vary among health care providers and hospitals. What can I expect after the procedure?  Your blood pressure, heart rate, breathing rate, and blood oxygen level will be monitored until you leave the hospital or clinic.  Your heart rhythm will be watched to make sure it does not change.  You may have some redness on the skin where the shocks were given. Follow these instructions at home:  Do not drive for 24 hours if you were given a sedative during your procedure.  Take over-the-counter and prescription medicines only as told by your health care provider.  Ask your health care provider how to check your pulse. Check it often.  Rest for 48 hours after the procedure or   as told by your health care provider.  Avoid or limit your caffeine use as told by your health care provider.  Keep all follow-up visits as told by your health care provider. This is important. Contact a health care provider if:  You feel like your heart is beating too quickly or your pulse is not regular.  You have a serious muscle cramp that does not go  away. Get help right away if:  You have discomfort in your chest.  You are dizzy or you feel faint.  You have trouble breathing or you are short of breath.  Your speech is slurred.  You have trouble moving an arm or leg on one side of your body.  Your fingers or toes turn cold or blue. Summary  Electrical cardioversion is the delivery of a jolt of electricity to restore a normal rhythm to the heart.  This procedure may be done right away in an emergency or may be a scheduled procedure if the condition is not an emergency.  Generally, this is a safe procedure.  After the procedure, check your pulse often as told by your health care provider. This information is not intended to replace advice given to you by your health care provider. Make sure you discuss any questions you have with your health care provider. Document Revised: 12/11/2018 Document Reviewed: 12/11/2018 Elsevier Patient Education  2020 [employer]  

## 2019-11-22 NOTE — Anesthesia Preprocedure Evaluation (Addendum)
Anesthesia Evaluation  Patient identified by MRN, date of birth, ID band Patient awake    Reviewed: Allergy & Precautions, NPO status , Patient's Chart, lab work & pertinent test results  History of Anesthesia Complications Negative for: history of anesthetic complications  Airway Mallampati: II  TM Distance: >3 FB Neck ROM: Full    Dental no notable dental hx. (+) Teeth Intact   Pulmonary shortness of breath and with exertion, neg sleep apnea, neg COPD, Patient abstained from smoking.Not current smoker, former smoker,    Pulmonary exam normal breath sounds clear to auscultation       Cardiovascular Exercise Tolerance: Good METShypertension, Pt. on medications (-) CAD and (-) Past MI + dysrhythmias Atrial Fibrillation  Rhythm:Irregular Rate:Tachycardia - Systolic murmurs TTE 2020: INTERPRETATION  NORMAL LEFT VENTRICULAR SYSTOLIC FUNCTION  WITH MILD LVH  NORMAL RIGHT VENTRICULAR SYSTOLIC FUNCTION  MILD VALVULAR REGURGITATION (See above)  NO VALVULAR STENOSIS  MILD MR, TR  EF 50%     Neuro/Psych negative neurological ROS  negative psych ROS   GI/Hepatic neg GERD  ,(+)     (-) substance abuse  ,   Endo/Other  diabetes  Renal/GU CRFRenal disease     Musculoskeletal   Abdominal   Peds  Hematology   Anesthesia Other Findings Past Medical History: No date: Asthma No date: Diabetes mellitus without complication (HCC) No date: Hyperlipidemia No date: Hypertension No date: Lymphopenia No date: Thrombocytopenia (HCC)  Reproductive/Obstetrics                            Anesthesia Physical Anesthesia Plan  ASA: II  Anesthesia Plan: General   Post-op Pain Management:    Induction: Intravenous  PONV Risk Score and Plan: 2 and Ondansetron, Propofol infusion and TIVA  Airway Management Planned: Nasal Cannula and Natural Airway  Additional Equipment: None  Intra-op Plan:    Post-operative Plan:   Informed Consent: I have reviewed the patients History and Physical, chart, labs and discussed the procedure including the risks, benefits and alternatives for the proposed anesthesia with the patient or authorized representative who has indicated his/her understanding and acceptance.     Dental advisory given  Plan Discussed with: CRNA and Surgeon  Anesthesia Plan Comments: (Discussed risks of anesthesia with patient, including possibility of difficulty with spontaneous ventilation under anesthesia necessitating airway intervention, PONV, and rare risks such as cardiac or respiratory or neurological events. Patient understands.)        Anesthesia Quick Evaluation

## 2019-11-22 NOTE — CV Procedure (Signed)
Electrical Cardioversion Procedure Note Jonathan [name] [id number] [birth year]  Procedure: Electrical Cardioversion Indications:  Paroxysmal non valvular atrial fibrillation  Procedure Details Consent: Risks of procedure as well as the alternatives and risks of each were explained to the (patient/caregiver).  Consent for procedure obtained. Time Out: Verified patient identification, verified procedure, site/side was marked, verified correct patient position, special equipment/implants available, medications/allergies/relevent history reviewed, required imaging and test results available.  Performed  Patient placed on cardiac monitor, pulse oximetry, supplemental oxygen as necessary.  Sedation given: Propofol and versed as per anesthesia  Pacer pads placed anterior and posterior chest.  Cardioverted 1 time(s).  Cardioverted at 120J.  Evaluation Findings: Post procedure EKG shows: NSR Complications: None Patient did  tolerate procedure well.   Arnoldo Hooker M.D. [facility] 11/22/2019, 7:38 AM

## 2019-11-22 NOTE — Anesthesia Postprocedure Evaluation (Signed)
Anesthesia Post Note  Patient: Jonathan [name]  Procedure(s) Performed: CARDIOVERSION (N/A )  Patient location during evaluation: Specials Recovery Anesthesia Type: General Level of consciousness: awake and alert Pain management: pain level controlled Vital Signs Assessment: post-procedure vital signs reviewed and stable Respiratory status: spontaneous breathing, nonlabored ventilation, respiratory function stable and patient connected to nasal cannula oxygen Cardiovascular status: blood pressure returned to baseline and stable Postop Assessment: no apparent nausea or vomiting Anesthetic complications: no   No complications documented.   Last Vitals:  Vitals:   11/22/19 0743 11/22/19 0747  BP: (!) 93/49 (!) 79/57  Pulse: 92 93  Resp: (!) 21 18  Temp:    SpO2: 97% 96%    Last Pain:  Vitals:   11/22/19 0743  TempSrc:   PainSc: 0-No pain                 Corinda Gubler

## 2019-11-22 NOTE — Transfer of Care (Signed)
Immediate Anesthesia Transfer of Care Note  Patient: Jonathan [name]  Procedure(s) Performed: CARDIOVERSION (N/A )  Patient Location: PACU and Cath Lab  Anesthesia Type:General  Level of Consciousness: awake  Airway & Oxygen Therapy: Patient Spontanous Breathing and Patient connected to nasal cannula oxygen  Post-op Assessment: Report given to RN  Post vital signs: stable  Last Vitals:  Vitals Value Taken Time  BP    Temp    Pulse    Resp    SpO2      Last Pain:  Vitals:   11/22/19 0651  TempSrc: Oral         Complications: No complications documented.

## 2019-11-28 ENCOUNTER — Other Ambulatory Visit: Payer: Self-pay | Admitting: Nurse Practitioner

## 2019-11-28 NOTE — Telephone Encounter (Signed)
Requested Prescriptions  Pending Prescriptions Disp Refills  . metFORMIN (GLUCOPHAGE) 500 MG tablet [Pharmacy Med Name: METFORMIN HCL 500 MG TABLET] 180 tablet 0    Sig: TAKE 1 TABLET (500 MG TOTAL) BY MOUTH 2 (TWO) TIMES DAILY WITH A MEAL.     Endocrinology:  Diabetes - Biguanides Failed - 11/28/2019  1:24 AM      Failed - Cr in normal range and within 360 days    Creatinine  Date Value Ref Range Status  11/15/2011 1.41 (H) 0.60 - 1.30 mg/dL Final   Creatinine, Ser  Date Value Ref Range Status  07/13/2019 1.46 (H) 0.76 - 1.27 mg/dL Final         Failed - eGFR in normal range and within 360 days    EGFR (African American)  Date Value Ref Range Status  11/15/2011 60 (L)  Final   GFR calc Af Amer  Date Value Ref Range Status  07/13/2019 54 (L) >59 mL/min/1.73 Final   EGFR (Non-African Amer.)  Date Value Ref Range Status  11/15/2011 52 (L)  Final    Comment:    eGFR values <96m/min/1.73 m2 may be an indication of chronic kidney disease (CKD). Calculated eGFR is useful in patients with stable renal function. The eGFR calculation will not be reliable in acutely ill patients when serum creatinine is changing rapidly. It is not useful in  patients on dialysis. The eGFR calculation may not be applicable to patients at the low and high extremes of body sizes, pregnant women, and vegetarians.    GFR calc non Af Amer  Date Value Ref Range Status  07/13/2019 47 (L) >59 mL/min/1.73 Final         Passed - HBA1C is between 0 and 7.9 and within 180 days    HB A1C (BAYER DCA - WAIVED)  Date Value Ref Range Status  10/05/2019 7.5 (H) <7.0 % Final    Comment:                                          Diabetic Adult            <7.0                                       Healthy Adult        4.3 - 5.7                                                           (DCCT/NGSP) American Diabetes Association's Summary of Glycemic Recommendations for Adults with Diabetes: Hemoglobin A1c <7.0%.  More stringent glycemic goals (A1c <6.0%) may further reduce complications at the cost of increased risk of hypoglycemia.          Passed - Valid encounter within last 6 months    Recent Outpatient Visits          3 weeks ago Type 2 diabetes mellitus with stage 3b chronic kidney disease, without long-term current use of insulin ([place]   [place] [facility] [place]   1 month ago Type 2 diabetes mellitus with stage 3b chronic  kidney disease, without long-term current use of insulin ([place])   [place], [place] T, NP   2 months ago Impacted cerumen of left ear   [place], Jolene T, NP   4 months ago Type 2 diabetes mellitus with stage 3b chronic kidney disease, without long-term current use of insulin ([place])   [place], [place] T, NP   6 months ago Viral upper respiratory tract infection   [facility] [place] I, NP      Future Appointments            In 1 month Cannady, Barbaraann Faster, NP [facility]   In 5 months  [facility]

## 2019-12-11 ENCOUNTER — Other Ambulatory Visit: Payer: Self-pay | Admitting: Nurse Practitioner

## 2020-01-02 DIAGNOSIS — Z8679 Personal history of other diseases of the circulatory system: Secondary | ICD-10-CM | POA: Diagnosis not present

## 2020-01-02 DIAGNOSIS — E782 Mixed hyperlipidemia: Secondary | ICD-10-CM | POA: Diagnosis not present

## 2020-01-02 DIAGNOSIS — I480 Paroxysmal atrial fibrillation: Secondary | ICD-10-CM | POA: Diagnosis not present

## 2020-01-02 DIAGNOSIS — I6523 Occlusion and stenosis of bilateral carotid arteries: Secondary | ICD-10-CM | POA: Diagnosis not present

## 2020-01-02 DIAGNOSIS — Z9889 Other specified postprocedural states: Secondary | ICD-10-CM | POA: Diagnosis not present

## 2020-01-02 DIAGNOSIS — I10 Essential (primary) hypertension: Secondary | ICD-10-CM | POA: Diagnosis not present

## 2020-01-09 ENCOUNTER — Telehealth: Payer: Self-pay

## 2020-01-09 ENCOUNTER — Ambulatory Visit: Payer: Medicare Other | Admitting: Nurse Practitioner

## 2020-01-24 ENCOUNTER — Ambulatory Visit (INDEPENDENT_AMBULATORY_CARE_PROVIDER_SITE_OTHER): Payer: Medicare Other | Admitting: Nurse Practitioner

## 2020-01-24 ENCOUNTER — Other Ambulatory Visit: Payer: Self-pay

## 2020-01-24 ENCOUNTER — Encounter: Payer: Self-pay | Admitting: Nurse Practitioner

## 2020-01-24 VITALS — BP 101/66 | HR 78 | Temp 97.7°F | Wt 210.8 lb

## 2020-01-24 DIAGNOSIS — E1121 Type 2 diabetes mellitus with diabetic nephropathy: Secondary | ICD-10-CM | POA: Diagnosis not present

## 2020-01-24 DIAGNOSIS — E1159 Type 2 diabetes mellitus with other circulatory complications: Secondary | ICD-10-CM | POA: Diagnosis not present

## 2020-01-24 DIAGNOSIS — N1832 Chronic kidney disease, stage 3b: Secondary | ICD-10-CM | POA: Diagnosis not present

## 2020-01-24 DIAGNOSIS — I480 Paroxysmal atrial fibrillation: Secondary | ICD-10-CM

## 2020-01-24 DIAGNOSIS — E785 Hyperlipidemia, unspecified: Secondary | ICD-10-CM | POA: Diagnosis not present

## 2020-01-24 DIAGNOSIS — E1169 Type 2 diabetes mellitus with other specified complication: Secondary | ICD-10-CM

## 2020-01-24 DIAGNOSIS — I6523 Occlusion and stenosis of bilateral carotid arteries: Secondary | ICD-10-CM

## 2020-01-24 DIAGNOSIS — I10 Essential (primary) hypertension: Secondary | ICD-10-CM | POA: Diagnosis not present

## 2020-01-24 DIAGNOSIS — Z6829 Body mass index (BMI) 29.0-29.9, adult: Secondary | ICD-10-CM | POA: Diagnosis not present

## 2020-01-24 DIAGNOSIS — I152 Hypertension secondary to endocrine disorders: Secondary | ICD-10-CM

## 2020-01-24 LAB — BAYER DCA HB A1C WAIVED: HB A1C (BAYER DCA - WAIVED): 7.7 % — ABNORMAL HIGH (ref <7.0)

## 2020-01-24 MED ORDER — TRULICITY 4.5 MG/0.5ML ~~LOC~~ SOAJ
4.5000 mg | SUBCUTANEOUS | 4 refills | Status: DC
Start: 2020-01-24 — End: 2020-02-04

## 2020-01-24 MED ORDER — METFORMIN HCL ER 500 MG PO TB24: 500.0000 mg | ORAL_TABLET | Freq: Two times a day (BID) | ORAL | 4 refills | Status: DC

## 2020-01-24 MED ORDER — METFORMIN HCL 500 MG PO TABS: 500.0000 mg | ORAL_TABLET | Freq: Two times a day (BID) | ORAL | 4 refills | Status: DC

## 2020-01-24 NOTE — Progress Notes (Signed)
BP 101/66   Pulse 78   Temp 97.7 F (36.5 C) (Oral)   Wt 210 lb 12.8 oz (95.6 kg)   SpO2 97%   BMI 29.82 kg/m    Subjective:    Patient ID: Jonathan [name], male    DOB: [birth year], 74 y.o.   MRN: [id number]  HPI: Jonathan [name] is a 74 y.o. male  Chief Complaint  Patient presents with  . Diabetes  . Hyperlipidemia  . Hypertension   DIABETES Last A1C was 7.5% last visit. Had increased Trulicity 3 MG weekly and continued Metformin 500 MG BID, discontinued Januvia this year due to similar function to Trulicity. Reports he has been trying to lose weight and focused on diet -- but during recent cardiac events has not been able to. Tried Invokana, but was discontinued in past due to kidney function. Hypoglycemic episodes:no Polydipsia/polyuria: no Visual disturbance: no Chest pain: no Paresthesias: no Glucose Monitoring: yes Accucheck frequency: weekly Fasting glucose: 170's Post prandial: 130-140's Evening:  Before meals: Taking Insulin?: no Long acting insulin: Short acting insulin: Blood Pressure Monitoring: daily Retinal Examination: not up to date Foot Exam: Up to Date Pneumovax: Up to Date Influenza: Up to Date Aspirin: no  CHRONIC KIDNEY DISEASE Last labs GFR 47 and CRT 1.46.  CKD status: stable Medications renally dose: yes Previous renal evaluation: yes Pneumovax:  Up to Date Influenza Vaccine:  Up to Date  ATRIAL FIBRILLATION Having recent episodes of return of a-fib and had cardioversion performed on 11/22/19.  Has been following cardiology for this and last saw 01/02/20 -- Dr. Freida Busman, was continues on Eliquis 5 MG BID + Tambocor 100 MG BID + Metoprolol 100 MG BID.  Does endorse increased fatigue with new regimen which he reports cardiology is aware of.  Had seen Dr. Maisie Fus from EP about left atrial appendage closure device, but has opted not to do this at this time. Last  EF was 50% with mild LVH. Had been on Pradexa in past prior to ablation for a-fib, but did not tolerate this.  Atrial fibrillation status: controlled Satisfied with current treatment: making him tired right now  Medication side effects:  no Medication compliance: good compliance Etiology of atrial fibrillation:  Palpitations:  no Chest pain:  no Dyspnea on exertion:  no Orthopnea:  no Syncope:  no Edema:  no Ventricular rate control: B-blocker Anti-coagulation: long acting  Relevant past medical, surgical, family and social history reviewed and updated as indicated. Interim medical history since our last visit reviewed. Allergies and medications reviewed and updated.  Review of Systems  Constitutional: Negative for activity change, diaphoresis, fatigue and fever.  Respiratory: Negative for cough, chest tightness, shortness of breath and wheezing.   Cardiovascular: Negative for chest pain, palpitations and leg swelling.  Gastrointestinal: Negative.   Endocrine: Negative for cold intolerance, heat intolerance, polydipsia, polyphagia and polyuria.  Neurological: Negative.   Psychiatric/Behavioral: Negative.     Per HPI unless specifically indicated above     Objective:    BP 101/66   Pulse 78   Temp 97.7 F (36.5 C) (Oral)   Wt 210 lb 12.8 oz (95.6 kg)   SpO2 97%   BMI 29.82 kg/m   Wt Readings from Last 3 Encounters:  01/24/20 210 lb 12.8 oz (95.6 kg)  11/22/19 205 lb (93 kg)  11/06/19 208 lb 12.8 oz (94.7 kg)    Physical Exam Vitals and nursing note reviewed.  Constitutional:      General: He is awake. He  is not in acute distress.    Appearance: He is well-developed, well-groomed and overweight. He is not ill-appearing.  HENT:     Head: Normocephalic and atraumatic.     Right Ear: Hearing normal. No drainage.     Left Ear: Hearing normal. No drainage.  Eyes:     General: Lids are normal.        Right eye: No discharge.        Left eye: No discharge.      Conjunctiva/sclera: Conjunctivae normal.     Pupils: Pupils are equal, round, and reactive to light.  Neck:     Trachea: Trachea normal.  Cardiovascular:     Rate and Rhythm: Normal rate and regular rhythm.     Heart sounds: Normal heart sounds, S1 normal and S2 normal. No murmur heard.  No gallop.   Pulmonary:     Effort: Pulmonary effort is normal. No accessory muscle usage or respiratory distress.     Breath sounds: Normal breath sounds.  Abdominal:     General: Bowel sounds are normal.     Palpations: Abdomen is soft. There is no hepatomegaly or splenomegaly.  Musculoskeletal:        General: Normal range of motion.     Cervical back: Normal range of motion and neck supple.     Right lower leg: No edema.     Left lower leg: No edema.  Skin:    General: Skin is warm and dry.  Neurological:     Mental Status: He is alert and oriented to person, place, and time.  Psychiatric:        Attention and Perception: Attention normal.        Mood and Affect: Mood normal.        Speech: Speech normal.        Behavior: Behavior normal. Behavior is cooperative.        Thought Content: Thought content normal.    Diabetic Foot Exam - Simple   Simple Foot Form Visual Inspection No deformities, no ulcerations, no other skin breakdown bilaterally: Yes Sensation Testing Intact to touch and monofilament testing bilaterally: Yes Pulse Check Posterior Tibialis and Dorsalis pulse intact bilaterally: Yes Comments    Results for orders placed or performed during the hospital encounter of 11/20/19  SARS CORONAVIRUS 2 (TAT 6-24 HRS) Nasopharyngeal Nasopharyngeal Swab   Specimen: Nasopharyngeal Swab  Result Value Ref Range   SARS Coronavirus 2 NEGATIVE NEGATIVE      Assessment & Plan:   Problem List Items Addressed This Visit      Cardiovascular and Mediastinum   Hypertension associated with diabetes (HCC)    Chronic, ongoing.  Continue current medication regimen and collaboration with  cardiology and nephrology.  BP at goal on home readings, continue to monitor these and focus on DASH diet.  Return in 3 months.       Relevant Medications   Dulaglutide (TRULICITY) 4.5 MG/0.5ML SOPN   metFORMIN (GLUCOPHAGE XR) 500 MG 24 hr tablet   Paroxysmal A-fib (HCC)    Chronic, sees cardiology.  Continue current medication regimen as prescribed by cardiology at this time and adjust as needed.  Recent cardiology note reviewed.  Continue collaboration.  Sess cardiology tomorrow, will obtain labs requested to include CMP, Mag, and lipid.      Relevant Orders   Magnesium   Comprehensive metabolic panel     Endocrine   Hyperlipidemia associated with type 2 diabetes mellitus (HCC)    Chronic, ongoing.  Continue with current regimen and adjust as needed based on labs.  Lipid panel today.      Relevant Medications   Dulaglutide (TRULICITY) 4.5 MG/0.5ML SOPN   metFORMIN (GLUCOPHAGE XR) 500 MG 24 hr tablet   Other Relevant Orders   Bayer DCA Hb A1c Waived   Lipid Panel w/o Chol/HDL Ratio   Type 2 diabetes mellitus with chronic kidney disease, without long-term current use of insulin (HCC) - Primary    Chronic, ongoing.  A1C 7.7% today, upward trend.  Increase Trulicity to 4.5 MG weekly + continue Metformin 500 MG BID -- will change to ER tablet.   Renal function limits medications that can be used.  Recommend he check blood sugar twice a day, in morning fasting and 2 hours after a meal, document these.  Collaborate with CCM team.  Return in 6 weeks.      Relevant Medications   Dulaglutide (TRULICITY) 4.5 MG/0.5ML SOPN   metFORMIN (GLUCOPHAGE XR) 500 MG 24 hr tablet   Other Relevant Orders   Bayer DCA Hb A1c Waived     Genitourinary   CKD (chronic kidney disease), stage III    Chronic, ongoing.  Continue to collaborate with nephrology and renal dose medication, Lisinopril for kidney protection.  CMP today.        Other   BMI 29.0-29.9,adult    Recommended eating smaller high  protein, low fat meals more frequently and exercising 30 mins a day 5 times a week with a goal of 10-15lb weight loss in the next 3 months. Patient voiced their understanding and motivation to adhere to these recommendations.           Follow up plan: Return in about 6 weeks (around 03/06/2020) for Diabetes.

## 2020-01-24 NOTE — Patient Instructions (Signed)

## 2020-01-24 NOTE — Assessment & Plan Note (Signed)
Chronic, ongoing.  Continue with current regimen and adjust as needed based on labs.  Lipid panel today. 

## 2020-01-24 NOTE — Assessment & Plan Note (Signed)
Chronic, ongoing.  Continue to collaborate with nephrology and renal dose medication, Lisinopril for kidney protection.  CMP today.

## 2020-01-24 NOTE — Assessment & Plan Note (Signed)
Recommended eating smaller high protein, low fat meals more frequently and exercising 30 mins a day 5 times a week with a goal of 10-15lb weight loss in the next 3 months. Patient voiced their understanding and motivation to adhere to these recommendations.  

## 2020-01-24 NOTE — Assessment & Plan Note (Signed)
Chronic, ongoing.  A1C 7.7% today, upward trend.  Increase Trulicity to 4.5 MG weekly + continue Metformin 500 MG BID -- will change to ER tablet.   Renal function limits medications that can be used.  Recommend he check blood sugar twice a day, in morning fasting and 2 hours after a meal, document these.  Collaborate with CCM team.  Return in 6 weeks.

## 2020-01-24 NOTE — Assessment & Plan Note (Addendum)
Chronic, sees cardiology.  Continue current medication regimen as prescribed by cardiology at this time and adjust as needed.  Recent cardiology note reviewed.  Continue collaboration.  Sess cardiology tomorrow, will obtain labs requested to include CMP, Mag, and lipid.

## 2020-01-24 NOTE — Assessment & Plan Note (Signed)
Chronic, ongoing.  Continue current medication regimen and collaboration with cardiology and nephrology.  BP at goal on home readings, continue to monitor these and focus on DASH diet.  Return in 3 months.

## 2020-01-25 ENCOUNTER — Other Ambulatory Visit: Payer: Self-pay | Admitting: Nurse Practitioner

## 2020-01-25 DIAGNOSIS — E875 Hyperkalemia: Secondary | ICD-10-CM

## 2020-01-25 LAB — COMPREHENSIVE METABOLIC PANEL
ALT: 12 IU/L (ref 0–44)
AST: 20 IU/L (ref 0–40)
Albumin/Globulin Ratio: 1.8 (ref 1.2–2.2)
Albumin: 4.5 g/dL (ref 3.7–4.7)
Alkaline Phosphatase: 94 IU/L (ref 48–121)
BUN/Creatinine Ratio: 16 (ref 10–24)
BUN: 26 mg/dL (ref 8–27)
Bilirubin Total: 0.7 mg/dL (ref 0.0–1.2)
CO2: 24 mmol/L (ref 20–29)
Calcium: 9.6 mg/dL (ref 8.6–10.2)
Chloride: 101 mmol/L (ref 96–106)
Creatinine, Ser: 1.64 mg/dL — ABNORMAL HIGH (ref 0.76–1.27)
GFR calc Af Amer: 47 mL/min/{1.73_m2} — ABNORMAL LOW (ref >59)
GFR calc non Af Amer: 41 mL/min/{1.73_m2} — ABNORMAL LOW (ref >59)
Globulin, Total: 2.5 g/dL (ref 1.5–4.5)
Glucose: 152 mg/dL — ABNORMAL HIGH (ref 65–99)
Potassium: 5.9 mmol/L — ABNORMAL HIGH (ref 3.5–5.2)
Sodium: 138 mmol/L (ref 134–144)
Total Protein: 7 g/dL (ref 6.0–8.5)

## 2020-01-25 LAB — MAGNESIUM: Magnesium: 1.9 mg/dL (ref 1.6–2.3)

## 2020-01-25 LAB — LIPID PANEL W/O CHOL/HDL RATIO
Cholesterol, Total: 156 mg/dL (ref 100–199)
HDL: 45 mg/dL (ref >39)
LDL Chol Calc (NIH): 84 mg/dL (ref 0–99)
Triglycerides: 154 mg/dL — ABNORMAL HIGH (ref 0–149)
VLDL Cholesterol Cal: 27 mg/dL (ref 5–40)

## 2020-01-25 NOTE — Progress Notes (Signed)
Contacted via MyChart  Good evening, sorry for late lab note, you know I like to get these to patients earlier.:) Your labs have returned.  Cholesterol levels look good, LDL a little above goal this time.  Would like to see it less then 70.  Continue Atorvastatin daily.  - Kidney function continues to show stable kidney disease.  We could consider Jardiance in future for diabetes if needed since GFR is >30.  This medication as we discussed is also great for heart. - Potassium is a bit high, I would recommend cutting back on foods high in potassium over the next week.  Spinach, potatoes, bananas, mangos, and more.  I would like to recheck this next Friday.  Also drink plenty of water.  Please schedule lab visit for next Friday.  Let me know you received this message.  Thank you. Keep being awesome!!  Thank you for allowing me to participate in your care. Kindest regards, Coy Vandoren

## 2020-02-01 ENCOUNTER — Other Ambulatory Visit: Payer: Self-pay

## 2020-02-01 ENCOUNTER — Other Ambulatory Visit: Payer: Self-pay | Admitting: Nurse Practitioner

## 2020-02-01 ENCOUNTER — Other Ambulatory Visit: Payer: Medicare Other

## 2020-02-01 DIAGNOSIS — E875 Hyperkalemia: Secondary | ICD-10-CM | POA: Diagnosis not present

## 2020-02-02 LAB — BASIC METABOLIC PANEL
BUN/Creatinine Ratio: 16 (ref 10–24)
BUN: 27 mg/dL (ref 8–27)
CO2: 25 mmol/L (ref 20–29)
Calcium: 9.8 mg/dL (ref 8.6–10.2)
Chloride: 101 mmol/L (ref 96–106)
Creatinine, Ser: 1.67 mg/dL — ABNORMAL HIGH (ref 0.76–1.27)
GFR calc Af Amer: 46 mL/min/{1.73_m2} — ABNORMAL LOW (ref >59)
GFR calc non Af Amer: 40 mL/min/{1.73_m2} — ABNORMAL LOW (ref >59)
Glucose: 155 mg/dL — ABNORMAL HIGH (ref 65–99)
Potassium: 5.2 mmol/L (ref 3.5–5.2)
Sodium: 138 mmol/L (ref 134–144)

## 2020-02-03 NOTE — Progress Notes (Signed)
Contacted via MyChart  Good afternoon Jonathan [name], your labs have returned and potassium level has returned to within normal range, although at high normal.  I would continue focus on minimizing potassium rich foods in diet and ensuring good fluid intake daily.  Continue all current medications.  If any questions let me know. Keep being awesome!!  Thank you for allowing me to participate in your care. Kindest regards, Taite Baldassari

## 2020-02-04 ENCOUNTER — Other Ambulatory Visit: Payer: Self-pay | Admitting: Nurse Practitioner

## 2020-02-04 MED ORDER — TRULICITY 4.5 MG/0.5ML ~~LOC~~ SOAJ: 4.5000 mg | SUBCUTANEOUS | 4 refills | Status: DC

## 2020-03-06 DIAGNOSIS — E782 Mixed hyperlipidemia: Secondary | ICD-10-CM | POA: Diagnosis not present

## 2020-03-06 DIAGNOSIS — Z23 Encounter for immunization: Secondary | ICD-10-CM | POA: Diagnosis not present

## 2020-03-06 DIAGNOSIS — Z9889 Other specified postprocedural states: Secondary | ICD-10-CM | POA: Diagnosis not present

## 2020-03-06 DIAGNOSIS — I6523 Occlusion and stenosis of bilateral carotid arteries: Secondary | ICD-10-CM | POA: Diagnosis not present

## 2020-03-06 DIAGNOSIS — I4892 Unspecified atrial flutter: Secondary | ICD-10-CM | POA: Diagnosis not present

## 2020-03-06 DIAGNOSIS — I10 Essential (primary) hypertension: Secondary | ICD-10-CM | POA: Diagnosis not present

## 2020-03-06 DIAGNOSIS — Z8679 Personal history of other diseases of the circulatory system: Secondary | ICD-10-CM | POA: Diagnosis not present

## 2020-03-06 DIAGNOSIS — I480 Paroxysmal atrial fibrillation: Secondary | ICD-10-CM | POA: Diagnosis not present

## 2020-03-12 ENCOUNTER — Ambulatory Visit (INDEPENDENT_AMBULATORY_CARE_PROVIDER_SITE_OTHER): Payer: Medicare Other | Admitting: Nurse Practitioner

## 2020-03-12 ENCOUNTER — Other Ambulatory Visit: Payer: Self-pay

## 2020-03-12 ENCOUNTER — Encounter: Payer: Self-pay | Admitting: Nurse Practitioner

## 2020-03-12 VITALS — BP 135/84 | HR 90 | Temp 97.9°F | Resp 16 | Wt 212.0 lb

## 2020-03-12 DIAGNOSIS — I6523 Occlusion and stenosis of bilateral carotid arteries: Secondary | ICD-10-CM

## 2020-03-12 DIAGNOSIS — Z23 Encounter for immunization: Secondary | ICD-10-CM | POA: Diagnosis not present

## 2020-03-12 DIAGNOSIS — N1832 Chronic kidney disease, stage 3b: Secondary | ICD-10-CM | POA: Diagnosis not present

## 2020-03-12 DIAGNOSIS — E1122 Type 2 diabetes mellitus with diabetic chronic kidney disease: Secondary | ICD-10-CM | POA: Diagnosis not present

## 2020-03-12 NOTE — Progress Notes (Signed)
BP 135/84 (BP Location: Left Arm, Patient Position: Sitting, Cuff Size: Normal)   Pulse 90   Temp 97.9 F (36.6 C) (Oral)   Resp 16   Wt 212 lb (96.2 kg)   SpO2 98%   BMI 29.99 kg/m    Subjective:    Patient ID: Jonathan [name], male    DOB: 06/04/1945, 74 y.o.   MRN: [id number]  HPI: Jonathan [name] is a 74 y.o. male  Chief Complaint  Patient presents with  . Diabetes   DIABETES Last A1C was 7.7% last visit. Had increased Trulicity 4.5 MG weekly (has been taking for 5 weeks) and continued Metformin 500 MG BID, discontinued Januvia this year due to similar function to Trulicity. Reports he has been trying to lose weight and focused on diet -- but during recent cardiac events has not been able to. Tried Invokana, but was discontinued in past due to kidney function. Hypoglycemic episodes:no Polydipsia/polyuria: no Visual disturbance: no Chest pain: no Paresthesias: no Glucose Monitoring: yes Accucheck frequency: weekly Fasting glucose: 130-150's -- lower in the morning then previous Post prandial:  Evening:  Before meals: Taking Insulin?: no Long acting insulin: Short acting insulin: Blood Pressure Monitoring: daily Retinal Examination: not up to date Foot Exam: Up to Date Pneumovax: Up to Date Influenza: Up to Date Aspirin: no  Relevant past medical, surgical, family and social history reviewed and updated as indicated. Interim medical history since our last visit reviewed. Allergies and medications reviewed and updated.  Review of Systems  Constitutional: Negative for activity change, diaphoresis, fatigue and fever.  Respiratory: Negative for cough, chest tightness, shortness of breath and wheezing.   Cardiovascular: Negative for chest pain, palpitations and leg swelling.  Gastrointestinal: Negative.   Endocrine: Negative for cold intolerance, heat intolerance, polydipsia,  polyphagia and polyuria.  Neurological: Negative.   Psychiatric/Behavioral: Negative.     Per HPI unless specifically indicated above     Objective:    BP 135/84 (BP Location: Left Arm, Patient Position: Sitting, Cuff Size: Normal)   Pulse 90   Temp 97.9 F (36.6 C) (Oral)   Resp 16   Wt 212 lb (96.2 kg)   SpO2 98%   BMI 29.99 kg/m   Wt Readings from Last 3 Encounters:  03/12/20 212 lb (96.2 kg)  01/24/20 210 lb 12.8 oz (95.6 kg)  11/22/19 205 lb (93 kg)    Physical Exam Vitals and nursing note reviewed.  Constitutional:      General: He is awake. He is not in acute distress.    Appearance: He is well-developed, well-groomed and overweight. He is not ill-appearing.  HENT:     Head: Normocephalic and atraumatic.     Right Ear: Hearing normal. No drainage.     Left Ear: Hearing normal. No drainage.  Eyes:     General: Lids are normal.        Right eye: No discharge.        Left eye: No discharge.     Conjunctiva/sclera: Conjunctivae normal.     Pupils: Pupils are equal, round, and reactive to light.  Neck:     Trachea: Trachea normal.  Cardiovascular:     Rate and Rhythm: Normal rate and regular rhythm.     Heart sounds: Normal heart sounds, S1 normal and S2 normal. No murmur heard.  No gallop.   Pulmonary:     Effort: Pulmonary effort is normal. No accessory muscle usage or respiratory distress.     Breath sounds: Normal  breath sounds.  Abdominal:     General: Bowel sounds are normal.     Palpations: Abdomen is soft. There is no hepatomegaly or splenomegaly.  Musculoskeletal:        General: Normal range of motion.     Cervical back: Normal range of motion and neck supple.     Right lower leg: No edema.     Left lower leg: No edema.  Skin:    General: Skin is warm and dry.  Neurological:     Mental Status: He is alert and oriented to person, place, and time.  Psychiatric:        Attention and Perception: Attention normal.        Mood and Affect: Mood  normal.        Speech: Speech normal.        Behavior: Behavior normal. Behavior is cooperative.        Thought Content: Thought content normal.    Results for orders placed or performed in visit on 02/01/20  Basic metabolic panel  Result Value Ref Range   Glucose 155 (H) 65 - 99 mg/dL   BUN 27 8 - 27 mg/dL   Creatinine, Ser 9.47 (H) 0.76 - 1.27 mg/dL   GFR calc non Af Amer 40 (L) >59 mL/min/1.73   GFR calc Af Amer 46 (L) >59 mL/min/1.73   BUN/Creatinine Ratio 16 10 - 24   Sodium 138 134 - 144 mmol/L   Potassium 5.2 3.5 - 5.2 mmol/L   Chloride 101 96 - 106 mmol/L   CO2 25 20 - 29 mmol/L   Calcium 9.8 8.6 - 10.2 mg/dL      Assessment & Plan:   Problem List Items Addressed This Visit      Endocrine   Type 2 diabetes mellitus with chronic kidney disease, without long-term current use of insulin (HCC) - Primary    Chronic, ongoing.  A1C 7.7% recent visit, upward trend.  Increased Trulicity to 4.5 MG weekly + continued Metformin 500 MG BID ER.  His fasting sugars are trending down at home, suspect A1C will be improved next visit.  Renal function limits medications that can be used.  Recommend he check blood sugar twice a day, in morning fasting and 2 hours after a meal, document these.  Collaborate with CCM team.  Return in 2 months for A1C check.       Other Visit Diagnoses    Flu vaccine need       Flu vaccine today   Relevant Orders   Flu Vaccine QUAD High Dose(Fluad)       Follow up plan: Return in about 2 months (around 05/12/2020) for T2DM, HTN/HLD, A-FIB, CKD.

## 2020-03-12 NOTE — Assessment & Plan Note (Signed)
Chronic, ongoing.  A1C 7.7% recent visit, upward trend.  Increased Trulicity to 4.5 MG weekly + continued Metformin 500 MG BID ER.  His fasting sugars are trending down at home, suspect A1C will be improved next visit.  Renal function limits medications that can be used.  Recommend he check blood sugar twice a day, in morning fasting and 2 hours after a meal, document these.  Collaborate with CCM team.  Return in 2 months for A1C check.

## 2020-03-12 NOTE — Patient Instructions (Signed)

## 2020-03-30 ENCOUNTER — Other Ambulatory Visit: Payer: Self-pay | Admitting: Nurse Practitioner

## 2020-04-03 DIAGNOSIS — I10 Essential (primary) hypertension: Secondary | ICD-10-CM | POA: Diagnosis not present

## 2020-04-03 DIAGNOSIS — E782 Mixed hyperlipidemia: Secondary | ICD-10-CM | POA: Diagnosis not present

## 2020-04-03 DIAGNOSIS — I6523 Occlusion and stenosis of bilateral carotid arteries: Secondary | ICD-10-CM | POA: Diagnosis not present

## 2020-04-03 DIAGNOSIS — I480 Paroxysmal atrial fibrillation: Secondary | ICD-10-CM | POA: Diagnosis not present

## 2020-04-24 ENCOUNTER — Other Ambulatory Visit: Payer: Self-pay | Admitting: Nurse Practitioner

## 2020-04-28 DIAGNOSIS — Z23 Encounter for immunization: Secondary | ICD-10-CM | POA: Diagnosis not present

## 2020-05-08 ENCOUNTER — Telehealth: Payer: Self-pay | Admitting: Nurse Practitioner

## 2020-05-08 NOTE — Telephone Encounter (Signed)
Copied from CRM [id number]. Topic: Medicare AWV >> May 08, 2020  9:43 AM Claudette Laws R wrote: Reason for CRM:  Left message ... cancelled AWVS on May 22, 2020 and rescheduled to May 30, 2020 at 8:15 to be completed by phone instead of coming into the office.

## 2020-05-12 ENCOUNTER — Other Ambulatory Visit: Payer: Self-pay

## 2020-05-12 ENCOUNTER — Ambulatory Visit (INDEPENDENT_AMBULATORY_CARE_PROVIDER_SITE_OTHER): Payer: Medicare Other | Admitting: Nurse Practitioner

## 2020-05-12 ENCOUNTER — Encounter: Payer: Self-pay | Admitting: Nurse Practitioner

## 2020-05-12 VITALS — BP 122/79 | HR 74 | Temp 97.4°F | Ht 69.45 in | Wt 212.0 lb

## 2020-05-12 DIAGNOSIS — E1122 Type 2 diabetes mellitus with diabetic chronic kidney disease: Secondary | ICD-10-CM | POA: Diagnosis not present

## 2020-05-12 DIAGNOSIS — E785 Hyperlipidemia, unspecified: Secondary | ICD-10-CM

## 2020-05-12 DIAGNOSIS — E1169 Type 2 diabetes mellitus with other specified complication: Secondary | ICD-10-CM | POA: Diagnosis not present

## 2020-05-12 DIAGNOSIS — N1832 Chronic kidney disease, stage 3b: Secondary | ICD-10-CM | POA: Diagnosis not present

## 2020-05-12 DIAGNOSIS — I480 Paroxysmal atrial fibrillation: Secondary | ICD-10-CM

## 2020-05-12 DIAGNOSIS — E1159 Type 2 diabetes mellitus with other circulatory complications: Secondary | ICD-10-CM | POA: Diagnosis not present

## 2020-05-12 DIAGNOSIS — Z6829 Body mass index (BMI) 29.0-29.9, adult: Secondary | ICD-10-CM

## 2020-05-12 DIAGNOSIS — I152 Hypertension secondary to endocrine disorders: Secondary | ICD-10-CM

## 2020-05-12 DIAGNOSIS — D696 Thrombocytopenia, unspecified: Secondary | ICD-10-CM | POA: Diagnosis not present

## 2020-05-12 DIAGNOSIS — I6523 Occlusion and stenosis of bilateral carotid arteries: Secondary | ICD-10-CM

## 2020-05-12 LAB — BAYER DCA HB A1C WAIVED: HB A1C (BAYER DCA - WAIVED): 6.9 % (ref <7.0)

## 2020-05-12 MED ORDER — LISINOPRIL 20 MG PO TABS: 20.0000 mg | ORAL_TABLET | Freq: Every day | ORAL | 4 refills | Status: DC

## 2020-05-12 MED ORDER — ATORVASTATIN CALCIUM 80 MG PO TABS: 80.0000 mg | ORAL_TABLET | Freq: Every day | ORAL | 4 refills | Status: DC

## 2020-05-12 MED ORDER — LEVOTHYROXINE SODIUM 125 MCG PO TABS: 125.0000 ug | ORAL_TABLET | Freq: Every day | ORAL | 4 refills | Status: DC

## 2020-05-12 NOTE — Assessment & Plan Note (Signed)
Chronic, ongoing.  Continue to collaborate with nephrology as needed and renal dose medication, Lisinopril for kidney protection.  BMP today.

## 2020-05-12 NOTE — Progress Notes (Signed)
BP 122/79   Pulse 74   Temp (!) 97.4 F (36.3 C) (Oral)   Ht 5' 9.45" (1.764 m)   Wt 212 lb (96.2 kg)   SpO2 100%   BMI 30.90 kg/m    Subjective:    Patient ID: Jonathan [name], male    DOB: [birth year], 74 y.o.   MRN: [id number]  HPI: Jonathan [name] is a 75 y.o. male  Chief Complaint  Patient presents with  . Hypertension  . Hyperlipidemia  . Diabetes  . Chronic Kidney Disease  . Atrial Fibrillation   DIABETES Last A1C was 7.7% last visit. Had increased Trulicity 4.5 MG weekly and continued Metformin 500 MG BID, discontinued Januvia this year due to similar function to Trulicity. Reports he has been trying to lose weight and focused on diet -- but during recent cardiac events has not been able to. Tried Invokana, but was discontinued in past due to kidney function. Hypoglycemic episodes:no Polydipsia/polyuria: no Visual disturbance: no Chest pain: no Paresthesias: no Glucose Monitoring: yes Accucheck frequency: weekly Fasting glucose: 145-155 Post prandial: 135 range Evening:  Before meals: Taking Insulin?: no Long acting insulin: Short acting insulin: Blood Pressure Monitoring: daily Retinal Examination: not up to date Foot Exam: Up to Date Pneumovax: Up to Date Influenza: Up to Date Aspirin: no  CHRONIC KIDNEY DISEASE Last labs GFR 40 and CRT 1.67.  CKD status: stable Medications renally dose: yes Previous renal evaluation: yes Pneumovax:  Up to Date Influenza Vaccine:  Up to Date  ATRIAL FIBRILLATION Saw Dr. Gwen Pounds last 04/03/20 -- he reduced his Metoprolol BID, Apixaban, Flecainide, Lisinopril. Continues on Atorvastatin for HLD.  Had seen Dr. Maisie Fus from EP about left atrial appendage closure device, but has opted not to do this at this time. Last EF was 50% with mild LVH.  Atrial fibrillation status: controlled Satisfied with current treatment: stable Medication  side effects:  no Medication compliance: good compliance Etiology of atrial fibrillation:  Palpitations:  no Chest pain:  no Dyspnea on exertion:  no Orthopnea:  no Syncope:  no Edema:  no Ventricular rate control: B-blocker Anti-coagulation: long acting  Relevant past medical, surgical, family and social history reviewed and updated as indicated. Interim medical history since our last visit reviewed. Allergies and medications reviewed and updated.  Review of Systems  Constitutional: Negative for activity change, diaphoresis, fatigue and fever.  Respiratory: Negative for cough, chest tightness, shortness of breath and wheezing.   Cardiovascular: Negative for chest pain, palpitations and leg swelling.  Gastrointestinal: Negative.   Endocrine: Negative for cold intolerance, heat intolerance, polydipsia, polyphagia and polyuria.  Neurological: Negative.   Psychiatric/Behavioral: Negative.     Per HPI unless specifically indicated above     Objective:    BP 122/79   Pulse 74   Temp (!) 97.4 F (36.3 C) (Oral)   Ht 5' 9.45" (1.764 m)   Wt 212 lb (96.2 kg)   SpO2 100%   BMI 30.90 kg/m   Wt Readings from Last 3 Encounters:  05/12/20 212 lb (96.2 kg)  03/12/20 212 lb (96.2 kg)  01/24/20 210 lb 12.8 oz (95.6 kg)    Physical Exam Vitals and nursing note reviewed.  Constitutional:      General: He is awake. He is not in acute distress.    Appearance: He is well-developed, well-groomed and overweight. He is not ill-appearing.  HENT:     Head: Normocephalic and atraumatic.     Right Ear: Hearing normal. No drainage.  Left Ear: Hearing normal. No drainage.  Eyes:     General: Lids are normal.        Right eye: No discharge.        Left eye: No discharge.     Conjunctiva/sclera: Conjunctivae normal.     Pupils: Pupils are equal, round, and reactive to light.  Neck:     Trachea: Trachea normal.  Cardiovascular:     Rate and Rhythm: Normal rate and regular rhythm.      Heart sounds: Normal heart sounds, S1 normal and S2 normal. No murmur heard. No gallop.   Pulmonary:     Effort: Pulmonary effort is normal. No accessory muscle usage or respiratory distress.     Breath sounds: Normal breath sounds.  Abdominal:     General: Bowel sounds are normal.     Palpations: Abdomen is soft. There is no hepatomegaly or splenomegaly.  Musculoskeletal:        General: Normal range of motion.     Cervical back: Normal range of motion and neck supple.     Right lower leg: No edema.     Left lower leg: No edema.  Skin:    General: Skin is warm and dry.  Neurological:     Mental Status: He is alert and oriented to person, place, and time.  Psychiatric:        Attention and Perception: Attention normal.        Mood and Affect: Mood normal.        Speech: Speech normal.        Behavior: Behavior normal. Behavior is cooperative.        Thought Content: Thought content normal.     Results for orders placed or performed in visit on 02/01/20  Basic metabolic panel  Result Value Ref Range   Glucose 155 (H) 65 - 99 mg/dL   BUN 27 8 - 27 mg/dL   Creatinine, Ser 8.75 (H) 0.76 - 1.27 mg/dL   GFR calc non Af Amer 40 (L) >59 mL/min/1.73   GFR calc Af Amer 46 (L) >59 mL/min/1.73   BUN/Creatinine Ratio 16 10 - 24   Sodium 138 134 - 144 mmol/L   Potassium 5.2 3.5 - 5.2 mmol/L   Chloride 101 96 - 106 mmol/L   CO2 25 20 - 29 mmol/L   Calcium 9.8 8.6 - 10.2 mg/dL      Assessment & Plan:   Problem List Items Addressed This Visit      Cardiovascular and Mediastinum   Hypertension associated with diabetes (HCC)    Chronic, ongoing.  Continue current medication regimen and collaboration with cardiology.  BP at goal on home readings, continue to monitor these and focus on DASH diet.  Return in 3 months.       Relevant Medications   atorvastatin (LIPITOR) 80 MG tablet   lisinopril (ZESTRIL) 20 MG tablet   Other Relevant Orders   Basic metabolic panel   Bayer DCA Hb I4P  Waived   Paroxysmal A-fib (HCC)    Chronic, sees cardiology.  Continue current medication regimen as prescribed by cardiology at this time and adjust as needed.  Recent cardiology note reviewed.  Continue collaboration.  Labs today BMP and CBC.      Relevant Medications   atorvastatin (LIPITOR) 80 MG tablet   lisinopril (ZESTRIL) 20 MG tablet     Endocrine   Hyperlipidemia associated with type 2 diabetes mellitus (HCC)    Chronic, ongoing.  Continue with current  regimen and adjust as needed based on labs.  Lipid panel today.      Relevant Medications   atorvastatin (LIPITOR) 80 MG tablet   lisinopril (ZESTRIL) 20 MG tablet   Other Relevant Orders   Bayer DCA Hb A1c Waived   Lipid Panel w/o Chol/HDL Ratio   Type 2 diabetes mellitus with chronic kidney disease, without long-term current use of insulin (HCC) - Primary    Chronic, ongoing.  A1C 6.9% today, downward trend -- praised for this.  Increase Trulicity to 4.5 MG weekly + continue Metformin ER 500 MG BID.   Renal function limits medications that can be used.  Recommend he check blood sugar twice a day, in morning fasting and 2 hours after a meal, document these.  Collaborate with CCM team.  Return in 3 months.      Relevant Medications   atorvastatin (LIPITOR) 80 MG tablet   lisinopril (ZESTRIL) 20 MG tablet   Other Relevant Orders   CBC with Differential/Platelet   Basic metabolic panel   Bayer DCA Hb O8N Waived     Genitourinary   CKD (chronic kidney disease), stage III (HCC)    Chronic, ongoing.  Continue to collaborate with nephrology as needed and renal dose medication, Lisinopril for kidney protection.  BMP today.        Other   BMI 29.0-29.9,adult    Recommended eating smaller high protein, low fat meals more frequently and exercising 30 mins a day 5 times a week with a goal of 10-15lb weight loss in the next 3 months. Patient voiced their understanding and motivation to adhere to these recommendations.        Thrombocytopenia (HCC)    Recheck CBC every 6 months (ordered today), no current symptoms.  Previously followed by Dr. Kizzie Bane.          Follow up plan: Return in about 3 months (around 08/10/2020) for T2DM, HTN/HLD.

## 2020-05-12 NOTE — Assessment & Plan Note (Signed)
Chronic, sees cardiology.  Continue current medication regimen as prescribed by cardiology at this time and adjust as needed.  Recent cardiology note reviewed.  Continue collaboration.  Labs today BMP and CBC.

## 2020-05-12 NOTE — Assessment & Plan Note (Addendum)
Chronic, ongoing.  Continue current medication regimen and collaboration with cardiology.  BP at goal on home readings, continue to monitor these and focus on DASH diet.  Return in 3 months.

## 2020-05-12 NOTE — Assessment & Plan Note (Signed)
Chronic, ongoing.  Continue with current regimen and adjust as needed based on labs.  Lipid panel today. 

## 2020-05-12 NOTE — Assessment & Plan Note (Signed)
Chronic, ongoing.  A1C 6.9% today, downward trend -- praised for this.  Increase Trulicity to 4.5 MG weekly + continue Metformin ER 500 MG BID.   Renal function limits medications that can be used.  Recommend he check blood sugar twice a day, in morning fasting and 2 hours after a meal, document these.  Collaborate with CCM team.  Return in 3 months.

## 2020-05-12 NOTE — Assessment & Plan Note (Signed)
Recommended eating smaller high protein, low fat meals more frequently and exercising 30 mins a day 5 times a week with a goal of 10-15lb weight loss in the next 3 months. Patient voiced their understanding and motivation to adhere to these recommendations.  

## 2020-05-12 NOTE — Assessment & Plan Note (Addendum)
Recheck CBC every 6 months (ordered today), no current symptoms.  Previously followed by Dr. Kizzie Bane.

## 2020-05-12 NOTE — Patient Instructions (Signed)

## 2020-05-13 LAB — CBC WITH DIFFERENTIAL/PLATELET
Basophils Absolute: 0 10*3/uL (ref 0.0–0.2)
Basos: 1 %
EOS (ABSOLUTE): 0.1 10*3/uL (ref 0.0–0.4)
Eos: 3 %
Hematocrit: 41.1 % (ref 37.5–51.0)
Hemoglobin: 14.5 g/dL (ref 13.0–17.7)
Immature Grans (Abs): 0 10*3/uL (ref 0.0–0.1)
Immature Granulocytes: 0 %
Lymphocytes Absolute: 1.3 10*3/uL (ref 0.7–3.1)
Lymphs: 29 %
MCH: 30.6 pg (ref 26.6–33.0)
MCHC: 35.3 g/dL (ref 31.5–35.7)
MCV: 87 fL (ref 79–97)
Monocytes Absolute: 0.4 10*3/uL (ref 0.1–0.9)
Monocytes: 10 %
Neutrophils Absolute: 2.6 10*3/uL (ref 1.4–7.0)
Neutrophils: 57 %
Platelets: 131 10*3/uL — ABNORMAL LOW (ref 150–450)
RBC: 4.74 x10E6/uL (ref 4.14–5.80)
RDW: 12.8 % (ref 11.6–15.4)
WBC: 4.5 10*3/uL (ref 3.4–10.8)

## 2020-05-13 LAB — BASIC METABOLIC PANEL
BUN/Creatinine Ratio: 13 (ref 10–24)
BUN: 19 mg/dL (ref 8–27)
CO2: 23 mmol/L (ref 20–29)
Calcium: 9.6 mg/dL (ref 8.6–10.2)
Chloride: 102 mmol/L (ref 96–106)
Creatinine, Ser: 1.44 mg/dL — ABNORMAL HIGH (ref 0.76–1.27)
GFR calc Af Amer: 55 mL/min/{1.73_m2} — ABNORMAL LOW (ref >59)
GFR calc non Af Amer: 47 mL/min/{1.73_m2} — ABNORMAL LOW (ref >59)
Glucose: 145 mg/dL — ABNORMAL HIGH (ref 65–99)
Potassium: 5.1 mmol/L (ref 3.5–5.2)
Sodium: 138 mmol/L (ref 134–144)

## 2020-05-13 LAB — LIPID PANEL W/O CHOL/HDL RATIO
Cholesterol, Total: 150 mg/dL (ref 100–199)
HDL: 50 mg/dL (ref >39)
LDL Chol Calc (NIH): 74 mg/dL (ref 0–99)
Triglycerides: 148 mg/dL (ref 0–149)
VLDL Cholesterol Cal: 26 mg/dL (ref 5–40)

## 2020-05-13 NOTE — Progress Notes (Signed)
Contacted via MyChart   Good morning Jonathan [name], your labs have returned.  Platelet count remains on low side, but is remaining stable with no worsening.  We will continue to monitor this every 6 months.  Kidney function continues to show chronic kidney disease stage 3, but no worsening, so we will continue to monitor closely.  Cholesterol levels are at goal.  Keep up the great work!! Keep being amazing!!  Thank you for allowing me to participate in your care. Kindest regards, Shriya Aker

## 2020-05-22 ENCOUNTER — Ambulatory Visit: Payer: Medicare Other

## 2020-05-29 DIAGNOSIS — E782 Mixed hyperlipidemia: Secondary | ICD-10-CM | POA: Diagnosis not present

## 2020-05-29 DIAGNOSIS — I6523 Occlusion and stenosis of bilateral carotid arteries: Secondary | ICD-10-CM | POA: Diagnosis not present

## 2020-05-29 DIAGNOSIS — I10 Essential (primary) hypertension: Secondary | ICD-10-CM | POA: Diagnosis not present

## 2020-05-29 DIAGNOSIS — I480 Paroxysmal atrial fibrillation: Secondary | ICD-10-CM | POA: Diagnosis not present

## 2020-05-30 ENCOUNTER — Ambulatory Visit (INDEPENDENT_AMBULATORY_CARE_PROVIDER_SITE_OTHER): Payer: Medicare Other

## 2020-05-30 VITALS — Ht 69.5 in | Wt 210.0 lb

## 2020-05-30 DIAGNOSIS — Z0000 Encounter for general adult medical examination without abnormal findings: Secondary | ICD-10-CM | POA: Diagnosis not present

## 2020-05-30 NOTE — Progress Notes (Signed)
I connected with Tawni Pummel today by telephone and verified that I am speaking with the correct person using two identifiers. Location patient: home Location provider: work Persons participating in the virtual visit: Debbie Bellucci, Glenna Durand LPN.   I discussed the limitations, risks, security and privacy concerns of performing an evaluation and management service by telephone and the availability of in person appointments. I also discussed with the patient that there may be a patient responsible charge related to this service. The patient expressed understanding and verbally consented to this telephonic visit.    Interactive audio and video telecommunications were attempted between this provider and patient, however failed, due to patient having technical difficulties OR patient did not have access to video capability.  We continued and completed visit with audio only.     Vital signs may be patient reported or missing.  Subjective:   Jonathan [name] is a 75 y.o. male who presents for Medicare Annual/Subsequent preventive examination.  Review of Systems     Cardiac Risk Factors include: advanced age (>57mn, >>28women);diabetes mellitus;dyslipidemia;hypertension;male gender;obesity (BMI >30kg/m2);sedentary lifestyle     Objective:    Today's Vitals   05/30/20 0811  Weight: 210 lb (95.3 kg)  Height: 5' 9.5" (1.765 m)   Body mass index is 30.57 kg/m.  Advanced Directives 05/30/2020 05/21/2019 05/18/2018 05/06/2017 08/13/2016 05/04/2016 02/07/2015  Does Patient Have a Medical Advance Directive? Yes Yes - Yes Yes Yes Yes  Type of Advance Directive [place] will Living will;Healthcare Power of [place] will [place] will [place]Living will [place]Living will [place]Living will  Does patient want to make changes to medical advance  directive? - - No - Patient declined - - - No - Patient declined  Copy of [place] Chart? No - copy requested Yes - validated most recent copy scanned in chart (See row information) No - copy requested No - copy requested No - copy requested - No - copy requested    Current Medications (verified) Outpatient Encounter Medications as of 05/30/2020  Medication Sig  . amLODipine (NORVASC) 5 MG tablet Take 5 mg by mouth daily.  .Marland Kitchenapixaban (ELIQUIS) 5 MG TABS tablet Take 5 mg by mouth in the morning and at bedtime.   .Marland Kitchenatorvastatin (LIPITOR) 80 MG tablet Take 1 tablet (80 mg total) by mouth daily.  . cholecalciferol (VITAMIN D) 1000 units tablet Take 1,000 Units by mouth daily.  . Dulaglutide (TRULICITY) 4.5 MRX/5.4MGSOPN Inject 4.5 mg as directed once a week.  . flecainide (TAMBOCOR) 50 MG tablet Take 100 mg by mouth in the morning and at bedtime.   .Marland Kitchenlevothyroxine (SYNTHROID) 125 MCG tablet Take 1 tablet (125 mcg total) by mouth daily.  .Marland Kitchenlisinopril (ZESTRIL) 20 MG tablet Take 1 tablet (20 mg total) by mouth daily.  . magnesium oxide (MAG-OX) 400 MG tablet Take 1 tablet (400 mg total) by mouth daily.  . metFORMIN (GLUCOPHAGE XR) 500 MG 24 hr tablet Take 1 tablet (500 mg total) by mouth 2 (two) times daily.  . metoprolol tartrate (LOPRESSOR) 50 MG tablet Take 50 mg by mouth 2 (two) times daily.  . Multiple Vitamin (MULTIVITAMIN) tablet Take 1 tablet by mouth daily.  . ONE TOUCH ULTRA TEST test strip USE TO CHECK SUGAR TWICE DAILY  . vitamin B-12 (CYANOCOBALAMIN) 1000 MCG tablet Take 1,000 mcg by mouth daily. Every other day   No  facility-administered encounter medications on file as of 05/30/2020.    Allergies (verified) Patient has no known allergies.   History: Past Medical History:  Diagnosis Date  . Asthma   . Diabetes mellitus without complication ([place])   . Hyperlipidemia   . Hypertension   . Lymphopenia   . Thrombocytopenia ([place])    Past Surgical History:   Procedure Laterality Date  . CARDIOVERSION N/A 11/22/2019   Procedure: CARDIOVERSION;  Surgeon: Corey Skains, MD;  Location: [facility] ORS;  Service: Cardiovascular;  Laterality: N/A;  . heart ablation    . KNEE SURGERY Bilateral    Family History  Problem Relation Age of Onset  . Hypertension Mother   . Lung cancer Father        lung  . Lung disease Father   . Hypertension Father   . Cancer Sister        breast   Social History   Socioeconomic History  . Marital status: Married    Spouse name: Not on file  . Number of children: Not on file  . Years of education: Not on file  . Highest education level: Not on file  Occupational History  . Occupation: retired  Tobacco Use  . Smoking status: Former Smoker    Packs/day: 1.00    Years: 35.00    Pack years: 35.00    Types: Cigarettes    Quit date: 03/06/1978    Years since quitting: 42.2  . Smokeless tobacco: Former Systems developer    Quit date: 03/21/2007  Vaping Use  . Vaping Use: Never used  Substance and Sexual Activity  . Alcohol use: Yes    Alcohol/week: 2.0 standard drinks    Types: 2 Shots of liquor per week    Comment: approx. mixed drink 1-2x week  . Drug use: No  . Sexual activity: Yes  Other Topics Concern  . Not on file  Social History Narrative  . Not on file   Social Determinants of Health   Financial Resource Strain: Low Risk   . Difficulty of Paying Living Expenses: Not hard at all  Food Insecurity: No Food Insecurity  . Worried About Charity fundraiser in the Last Year: Never true  . Ran Out of Food in the Last Year: Never true  Transportation Needs: No Transportation Needs  . Lack of Transportation (Medical): No  . Lack of Transportation (Non-Medical): No  Physical Activity: Inactive  . Days of Exercise per Week: 0 days  . Minutes of Exercise per Session: 0 min  Stress: No Stress Concern Present  . Feeling of Stress : Not at all  Social Connections: Not on file    Tobacco Counseling Counseling  given: Not Answered   Clinical Intake:  Pre-visit preparation completed: Yes  Pain : No/denies pain     Nutritional Status: BMI > 30  Obese Nutritional Risks: None Diabetes: Yes  How often do you need to have someone help you when you read instructions, pamphlets, or other written materials from your doctor or pharmacy?: 1 - Never What is the last grade level you completed in school?: 77yr college  Diabetic? Yes Nutrition Risk Assessment:  Has the patient had any N/V/D within the last 2 months?  No  Does the patient have any non-healing wounds?  No  Has the patient had any unintentional weight loss or weight gain?  No   Diabetes:  Is the patient diabetic?  Yes  If diabetic, was a CBG obtained today?  No  Did  the patient bring in their glucometer from home?  No  How often do you monitor your CBG's? 2-3 weekly.   Financial Strains and Diabetes Management:  Are you having any financial strains with the device, your supplies or your medication? No .  Does the patient want to be seen by Chronic Care Management for management of their diabetes?  No  Would the patient like to be referred to a Nutritionist or for Diabetic Management?  No   Diabetic Exams:  Diabetic Eye Exam: Completed 09/19/2019 Diabetic Foot Exam: Completed 01/24/2020   Interpreter Needed?: No  Information entered by :: NAllen LPN   Activities of Daily Living In your present state of health, do you have any difficulty performing the following activities: 05/30/2020 11/22/2019  Hearing? Y N  Comment right ear decreased hearing -  Vision? N N  Difficulty concentrating or making decisions? N N  Walking or climbing stairs? N N  Dressing or bathing? N N  Doing errands, shopping? N -  Preparing Food and eating ? N -  Using the Toilet? N -  In the past six months, have you accidently leaked urine? N -  Do you have problems with loss of bowel control? N -  Managing your Medications? N -  Managing your  Finances? N -  Housekeeping or managing your Housekeeping? N -  Some recent data might be hidden    Patient Care Team: Venita Lick, NP as PCP - General (Nurse Practitioner) Corey Skains, MD as Consulting Physician (Cardiology) Lavonia Dana, MD as Consulting Physician (Nephrology)  Indicate any recent Medical Services you may have received from other than [facility] providers in the past year (date may be approximate).     Assessment:   This is a routine wellness examination for [place].  Hearing/Vision screen  Hearing Screening   125Hz  250Hz  500Hz  1000Hz  2000Hz  3000Hz  4000Hz  6000Hz  8000Hz   Right ear:           Left ear:           Vision Screening Comments: Regular eye exams, [facility]  Dietary issues and exercise activities discussed: Current Exercise Habits: The patient does not participate in regular exercise at present  Goals    .  DIET - INCREASE WATER INTAKE      Recommend drinking at least 6-8 glasses of water a day     .  Patient Stated      05/30/2020, wants to weigh under 200 pounds    .  PharmD "These medications are expensive" (pt-stated)      CARE PLAN ENTRY (see longitudinal plan of care for additional care plan information)  Current Barriers:  . Polypharmacy; complex patient with multiple comorbidities including T2DM, atrial fibrillation, carotid artery stenosis, CKD, HLD, HTN . Reports cost concerns w/ his medications, particularly Trulicity and Eliquis. In the Medicare Coverage Gap currently, though thinks he will pay out of it in the next ~month . Most recent eGFR: ~47 mL/min o T2DM: last A1c 7.5%. Metformin 094 mg BID, Trulicity 3 mg weekly (increased w/ last A1c). o Afib: follows w/ Dr. Nehemiah Massed. More recurrent runs of afib recently. Follows up w/ [facility] this afternoon for EKG, consideration for DCCV. Eliquis 5 mg BID, flecainide 100 mg BID, metoprolol tartrate 100 mg BID, lisinopril 20 mg daily o ASCVD risk reduction/carotid stenosis: atorvastatin 80  mg daily, last LDL right at goal at 74 o Supplements: Vitamin D 2000 units daily, Vitamin B12 1000 units daily, MVI, mag oxide 400  mg   Pharmacist Clinical Goal(s):  Marland Kitchen Over the next 90 days, patient will work with PharmD and provider towards optimized medication management  Interventions: . Comprehensive medication review performed; medication list updated in electronic medical record . Inter-disciplinary care team collaboration (see longitudinal plan of care) . Reviewed patient's household income. Patient is over income for Eliquis and Trulicity programs.  . Provided information regarding Xarelto $85/month program. He will take this with him to cardiology appointment this afternoon and discuss benefits vs risk of changing therapy with them. May avoid switch right now given pending DCCV. Additionally, once patient pays through [place] will be cheaper than $85/month  Patient Self Care Activities:  . Patient will take medications as prescribed  Initial goal documentation       Depression Screen PHQ 2/9 Scores 05/30/2020 05/21/2019 05/18/2018 05/06/2017 05/04/2016 05/04/2016 03/19/2016  PHQ - 2 Score 0 0 0 0 2 2 0    Fall Risk Fall Risk  05/30/2020 05/12/2020 05/21/2019 11/08/2018 05/18/2018  Falls in the past year? 0 0 0 0 1  Comment - - - - -  Number falls in past yr: - 0 0 0 0  Comment - - - - -  Injury with Fall? - 0 0 0 0  Risk for fall due to : Medication side effect - - - -  Follow up Falls evaluation completed;Education provided;Falls prevention discussed - - Falls evaluation completed -    FALL RISK PREVENTION PERTAINING TO THE HOME:  Any stairs in or around the home? Yes  If so, are there any without handrails? No  Home free of loose throw rugs in walkways, pet beds, electrical cords, etc? Yes  Adequate lighting in your home to reduce risk of falls? Yes   ASSISTIVE DEVICES UTILIZED TO PREVENT FALLS:  Life alert? No  Use of a cane, walker or w/c? No   Grab bars in the bathroom? No  Shower chair or bench in shower? No  Elevated toilet seat or a handicapped toilet? Yes   TIMED UP AND GO:  Was the test performed? No .     Cognitive Function:     6CIT Screen 05/30/2020 05/18/2018 05/06/2017  What Year? 0 points 0 points 0 points  What month? 0 points 0 points 0 points  What time? 0 points 0 points 0 points  Count back from 20 2 points 0 points 0 points  Months in reverse 0 points 0 points 0 points  Repeat phrase 2 points 0 points 0 points  Total Score 4 0 0    Immunizations Immunization History  Administered Date(s) Administered  . Fluad Quad(high Dose 65+) 03/12/2020  . Influenza, High Dose Seasonal PF 01/17/2016, 01/16/2017, 01/03/2019  . Influenza-Unspecified 02/17/2015, 01/06/2018  . PFIZER SARS-COV-2 Vaccination 08/30/2019, 09/25/2019, 04/21/2020  . Pneumococcal Conjugate-13 09/18/2013, 10/10/2017  . Pneumococcal Polysaccharide-23 05/21/2014, 12/18/2018  . Td 03/27/2007  . Tdap 09/18/2013, 12/14/2018  . Zoster 03/15/2012  . Zoster Recombinat (Shingrix) 09/17/2017, 01/06/2018    TDAP status: Up to date  Flu Vaccine status: Up to date  Pneumococcal vaccine status: Up to date  Covid-19 vaccine status: Completed vaccines  Qualifies for Shingles Vaccine? Yes   Zostavax completed Yes   Shingrix Completed?: Yes  Screening Tests Health Maintenance  Topic Date Due  . Fecal DNA (Cologuard)  05/31/2020  . OPHTHALMOLOGY EXAM  09/18/2020  . HEMOGLOBIN A1C  11/10/2020  . FOOT EXAM  01/23/2021  . TETANUS/TDAP  12/13/2028  . INFLUENZA VACCINE  Completed  .  COVID-19 Vaccine  Completed  . Hepatitis C Screening  Completed  . PNA vac Low Risk Adult  Completed    Health Maintenance  There are no preventive care reminders to display for this patient.  Colorectal cancer screening: Type of screening: Cologuard. Completed 06/10/2017. Repeat every 3 years  Lung Cancer Screening: (Low Dose CT Chest recommended if Age  81-80 years, 30 pack-year currently smoking OR have quit w/in 15years.) does not qualify.   Lung Cancer Screening Referral: no  Additional Screening:  Hepatitis C Screening: does qualify; Completed 09/02/2014  Vision Screening: Recommended annual ophthalmology exams for early detection of glaucoma and other disorders of the eye. Is the patient up to date with their annual eye exam?  Yes  Who is the provider or what is the name of the office in which the patient attends annual eye exams? [facility] If pt is not established with a provider, would they like to be referred to a provider to establish care? No .   Dental Screening: Recommended annual dental exams for proper oral hygiene  Community Resource Referral / Chronic Care Management: CRR required this visit?  No   CCM required this visit?  No      Plan:     I have personally reviewed and noted the following in the patient's chart:   . Medical and social history . Use of alcohol, tobacco or illicit drugs  . Current medications and supplements . Functional ability and status . Nutritional status . Physical activity . Advanced directives . List of other physicians . Hospitalizations, surgeries, and ER visits in previous 12 months . Vitals . Screenings to include cognitive, depression, and falls . Referrals and appointments  In addition, I have reviewed and discussed with patient certain preventive protocols, quality metrics, and best practice recommendations. A written personalized care plan for preventive services as well as general preventive health recommendations were provided to patient.     Kellie Simmering, LPN   [id number]   Nurse Notes:

## 2020-05-30 NOTE — Patient Instructions (Signed)
Jonathan [name] , Thank you for taking time to come for your Medicare Wellness Visit. I appreciate your ongoing commitment to your health goals. Please review the following plan we discussed and let me know if I can assist you in the future.   Screening recommendations/referrals: Colonoscopy: cologuard 06/10/2017, due 06/10/2020 Recommended yearly ophthalmology/optometry visit for glaucoma screening and checkup Recommended yearly dental visit for hygiene and checkup  Vaccinations: Influenza vaccine: completed 03/12/2020, due 12/22/2020 Pneumococcal vaccine: completed 12/18/2018 Tdap vaccine: completed 12/14/2018 Shingles vaccine: completed    Covid-19:  04/21/2020, 09/25/2019, 08/30/2019  Advanced directives: Please bring a copy of your POA (Power of Attorney) and/or Living Will to your next appointment.   Conditions/risks identified: none  Next appointment: Follow up in one year for your annual wellness visit.   Preventive Care 35 Years and Older, Male Preventive care refers to lifestyle choices and visits with your health care provider that can promote health and wellness. What does preventive care include?  A yearly physical exam. This is also called an annual well check.  Dental exams once or twice a year.  Routine eye exams. Ask your health care provider how often you should have your eyes checked.  Personal lifestyle choices, including:  Daily care of your teeth and gums.  Regular physical activity.  Eating a healthy diet.  Avoiding tobacco and drug use.  Limiting alcohol use.  Practicing safe sex.  Taking low doses of aspirin every day.  Taking vitamin and mineral supplements as recommended by your health care provider. What happens during an annual well check? The services and screenings done by your health care provider during your annual well check will depend on your age, overall health, lifestyle risk factors, and family history of disease. Counseling  Your health  care provider may ask you questions about your:  Alcohol use.  Tobacco use.  Drug use.  Emotional well-being.  Home and relationship well-being.  Sexual activity.  Eating habits.  History of falls.  Memory and ability to understand (cognition).  Work and work Astronomer. Screening  You may have the following tests or measurements:  Height, weight, and BMI.  Blood pressure.  Lipid and cholesterol levels. These may be checked every 5 years, or more frequently if you are over 31 years old.  Skin check.  Lung cancer screening. You may have this screening every year starting at age 15 if you have a 30-pack-year history of smoking and currently smoke or have quit within the past 15 years.  Fecal occult blood test (FOBT) of the stool. You may have this test every year starting at age 11.  Flexible sigmoidoscopy or colonoscopy. You may have a sigmoidoscopy every 5 years or a colonoscopy every 10 years starting at age 42.  Prostate cancer screening. Recommendations will vary depending on your family history and other risks.  Hepatitis C blood test.  Hepatitis B blood test.  Sexually transmitted disease (STD) testing.  Diabetes screening. This is done by checking your blood sugar (glucose) after you have not eaten for a while (fasting). You may have this done every 1-3 years.  Abdominal aortic aneurysm (AAA) screening. You may need this if you are a current or former smoker.  Osteoporosis. You may be screened starting at age 71 if you are at high risk. Talk with your health care provider about your test results, treatment options, and if necessary, the need for more tests. Vaccines  Your health care provider may recommend certain vaccines, such as:  Influenza vaccine.  This is recommended every year.  Tetanus, diphtheria, and acellular pertussis (Tdap, Td) vaccine. You may need a Td booster every 10 years.  Zoster vaccine. You may need this after age  40.  Pneumococcal 13-valent conjugate (PCV13) vaccine. One dose is recommended after age 73.  Pneumococcal polysaccharide (PPSV23) vaccine. One dose is recommended after age 31. Talk to your health care provider about which screenings and vaccines you need and how often you need them. This information is not intended to replace advice given to you by your health care provider. Make sure you discuss any questions you have with your health care provider. Document Released: 06/06/2015 Document Revised: 01/28/2016 Document Reviewed: 03/11/2015 Elsevier Interactive Patient Education  2017 [place] Prevention in the Home Falls can cause injuries. They can happen to people of all ages. There are many things you can do to make your home safe and to help prevent falls. What can I do on the outside of my home?  Regularly fix the edges of walkways and driveways and fix any cracks.  Remove anything that might make you trip as you walk through a door, such as a raised step or threshold.  Trim any bushes or trees on the path to your home.  Use bright outdoor lighting.  Clear any walking paths of anything that might make someone trip, such as rocks or tools.  Regularly check to see if handrails are loose or broken. Make sure that both sides of any steps have handrails.  Any raised decks and porches should have guardrails on the edges.  Have any leaves, snow, or ice cleared regularly.  Use sand or salt on walking paths during winter.  Clean up any spills in your garage right away. This includes oil or grease spills. What can I do in the bathroom?  Use night lights.  Install grab bars by the toilet and in the tub and shower. Do not use towel bars as grab bars.  Use non-skid mats or decals in the tub or shower.  If you need to sit down in the shower, use a plastic, non-slip stool.  Keep the floor dry. Clean up any water that spills on the floor as soon as it happens.  Remove  soap buildup in the tub or shower regularly.  Attach bath mats securely with double-sided non-slip rug tape.  Do not have throw rugs and other things on the floor that can make you trip. What can I do in the bedroom?  Use night lights.  Make sure that you have a light by your bed that is easy to reach.  Do not use any sheets or blankets that are too big for your bed. They should not hang down onto the floor.  Have a firm chair that has side arms. You can use this for support while you get dressed.  Do not have throw rugs and other things on the floor that can make you trip. What can I do in the kitchen?  Clean up any spills right away.  Avoid walking on wet floors.  Keep items that you use a lot in easy-to-reach places.  If you need to reach something above you, use a strong step stool that has a grab bar.  Keep electrical cords out of the way.  Do not use floor polish or wax that makes floors slippery. If you must use wax, use non-skid floor wax.  Do not have throw rugs and other things on the floor that can make  you trip. What can I do with my stairs?  Do not leave any items on the stairs.  Make sure that there are handrails on both sides of the stairs and use them. Fix handrails that are broken or loose. Make sure that handrails are as long as the stairways.  Check any carpeting to make sure that it is firmly attached to the stairs. Fix any carpet that is loose or worn.  Avoid having throw rugs at the top or bottom of the stairs. If you do have throw rugs, attach them to the floor with carpet tape.  Make sure that you have a light switch at the top of the stairs and the bottom of the stairs. If you do not have them, ask someone to add them for you. What else can I do to help prevent falls?  Wear shoes that:  Do not have high heels.  Have rubber bottoms.  Are comfortable and fit you well.  Are closed at the toe. Do not wear sandals.  If you use a  stepladder:  Make sure that it is fully opened. Do not climb a closed stepladder.  Make sure that both sides of the stepladder are locked into place.  Ask someone to hold it for you, if possible.  Clearly mark and make sure that you can see:  Any grab bars or handrails.  First and last steps.  Where the edge of each step is.  Use tools that help you move around (mobility aids) if they are needed. These include:  Canes.  Walkers.  Scooters.  Crutches.  Turn on the lights when you go into a dark area. Replace any light bulbs as soon as they burn out.  Set up your furniture so you have a clear path. Avoid moving your furniture around.  If any of your floors are uneven, fix them.  If there are any pets around you, be aware of where they are.  Review your medicines with your doctor. Some medicines can make you feel dizzy. This can increase your chance of falling. Ask your doctor what other things that you can do to help prevent falls. This information is not intended to replace advice given to you by your health care provider. Make sure you discuss any questions you have with your health care provider. Document Released: 03/06/2009 Document Revised: 10/16/2015 Document Reviewed: 06/14/2014 Elsevier Interactive Patient Education  2017 [employer].

## 2020-08-11 ENCOUNTER — Ambulatory Visit (INDEPENDENT_AMBULATORY_CARE_PROVIDER_SITE_OTHER): Payer: Medicare Other | Admitting: Internal Medicine

## 2020-08-11 ENCOUNTER — Other Ambulatory Visit: Payer: Self-pay

## 2020-08-11 ENCOUNTER — Encounter: Payer: Self-pay | Admitting: Internal Medicine

## 2020-08-11 VITALS — BP 146/85 | HR 85 | Temp 98.3°F | Ht 69.29 in | Wt 211.0 lb

## 2020-08-11 DIAGNOSIS — E1122 Type 2 diabetes mellitus with diabetic chronic kidney disease: Secondary | ICD-10-CM

## 2020-08-11 DIAGNOSIS — N1832 Chronic kidney disease, stage 3b: Secondary | ICD-10-CM | POA: Diagnosis not present

## 2020-08-11 LAB — URINALYSIS, ROUTINE W REFLEX MICROSCOPIC
Bilirubin, UA: NEGATIVE
Glucose, UA: NEGATIVE
Ketones, UA: NEGATIVE
Leukocytes,UA: NEGATIVE
Nitrite, UA: NEGATIVE
Protein,UA: NEGATIVE
RBC, UA: NEGATIVE
Specific Gravity, UA: 1.015 (ref 1.005–1.030)
Urobilinogen, Ur: 0.2 mg/dL (ref 0.2–1.0)
pH, UA: 6 (ref 5.0–7.5)

## 2020-08-11 LAB — BAYER DCA HB A1C WAIVED: HB A1C (BAYER DCA - WAIVED): 7.4 % — ABNORMAL HIGH (ref <7.0)

## 2020-08-11 NOTE — Progress Notes (Signed)
BP (!) 146/85   Pulse 85   Temp 98.3 F (36.8 C) (Oral)   Ht 5' 9.29" (1.76 m)   Wt 211 lb (95.7 kg)   SpO2 99%   BMI 30.90 kg/m    Subjective:    Patient ID: Jonathan [name], male    DOB: 02-Nov-1945, 75 y.o.   MRN: [id number]  HPI: Jonathan [name] is a 75 y.o. male  Diabetes He presents for his follow-up (fsbs 140 - 130 -- is on metformin 500 mg daily and trulicity 4.5 ) diabetic visit. He has type 2 diabetes mellitus. Pertinent negatives for hypoglycemia include no confusion, dizziness, headaches, nervousness/anxiousness or speech difficulty. Pertinent negatives for diabetes include no chest pain, no fatigue, no polydipsia, no polyphagia, no polyuria and no weakness.  Hypertension Pertinent negatives include no chest pain, headaches, palpitations or shortness of breath.  Hyperlipidemia Pertinent negatives include no chest pain or shortness of breath.    Chief Complaint  Patient presents with  . Diabetes  . Hypertension  . Hyperlipidemia    Relevant past medical, surgical, family and social history reviewed and updated as indicated. Interim medical history since our last visit reviewed. Allergies and medications reviewed and updated.  Review of Systems  Constitutional: Negative for activity change, appetite change, chills, fatigue and fever.  HENT: Negative for congestion.   Eyes: Negative for visual disturbance.  Respiratory: Negative for apnea, cough, chest tightness, shortness of breath and wheezing.   Cardiovascular: Negative for chest pain, palpitations and leg swelling.  Gastrointestinal: Negative for abdominal distention, abdominal pain, diarrhea and nausea.  Endocrine: Negative for cold intolerance, heat intolerance, polydipsia, polyphagia and polyuria.  Genitourinary: Negative for difficulty urinating, dysuria, frequency, hematuria and urgency.  Skin: Negative for color change and rash.  Neurological: Negative for dizziness, speech difficulty, weakness,  light-headedness, numbness and headaches.  Psychiatric/Behavioral: Negative for behavioral problems and confusion. The patient is not nervous/anxious.     Per HPI unless specifically indicated above     Objective:    BP (!) 146/85   Pulse 85   Temp 98.3 F (36.8 C) (Oral)   Ht 5' 9.29" (1.76 m)   Wt 211 lb (95.7 kg)   SpO2 99%   BMI 30.90 kg/m   Wt Readings from Last 3 Encounters:  08/11/20 211 lb (95.7 kg)  05/30/20 210 lb (95.3 kg)  05/12/20 212 lb (96.2 kg)    Physical Exam Vitals and nursing note reviewed.  Constitutional:      General: He is not in acute distress.    Appearance: Normal appearance. He is not ill-appearing or diaphoretic.  HENT:     Head: Normocephalic and atraumatic.     Right Ear: Tympanic membrane and external ear normal. There is no impacted cerumen.     Left Ear: External ear normal.     Nose: No congestion or rhinorrhea.     Mouth/Throat:     Pharynx: No oropharyngeal exudate or posterior oropharyngeal erythema.  Eyes:     Conjunctiva/sclera: Conjunctivae normal.     Pupils: Pupils are equal, round, and reactive to light.  Cardiovascular:     Rate and Rhythm: Normal rate and regular rhythm.     Heart sounds: No murmur heard. No friction rub. No gallop.   Pulmonary:     Effort: No respiratory distress.     Breath sounds: No stridor. No wheezing or rhonchi.  Chest:     Chest wall: No tenderness.  Abdominal:     General: Abdomen  is flat. Bowel sounds are normal.     Palpations: Abdomen is soft. There is no mass.     Tenderness: There is no abdominal tenderness.  Musculoskeletal:     Cervical back: Normal range of motion and neck supple. No rigidity or tenderness.     Left lower leg: No edema.  Skin:    General: Skin is warm and dry.  Neurological:     Mental Status: He is alert.     Results for orders placed or performed in visit on 05/12/20  CBC with Differential/Platelet  Result Value Ref Range   WBC 4.5 3.4 - 10.8 x10E3/uL    RBC 4.74 4.14 - 5.80 x10E6/uL   Hemoglobin 14.5 13.0 - 17.7 g/dL   Hematocrit 51.7 00.1 - 51.0 %   MCV 87 79 - 97 fL   MCH 30.6 26.6 - 33.0 pg   MCHC 35.3 31.5 - 35.7 g/dL   RDW 74.9 44.9 - 67.5 %   Platelets 131 (L) 150 - 450 x10E3/uL   Neutrophils 57 Not Estab. %   Lymphs 29 Not Estab. %   Monocytes 10 Not Estab. %   Eos 3 Not Estab. %   Basos 1 Not Estab. %   Neutrophils Absolute 2.6 1.4 - 7.0 x10E3/uL   Lymphocytes Absolute 1.3 0.7 - 3.1 x10E3/uL   Monocytes Absolute 0.4 0.1 - 0.9 x10E3/uL   EOS (ABSOLUTE) 0.1 0.0 - 0.4 x10E3/uL   Basophils Absolute 0.0 0.0 - 0.2 x10E3/uL   Immature Granulocytes 0 Not Estab. %   Immature Grans (Abs) 0.0 0.0 - 0.1 x10E3/uL  Basic metabolic panel  Result Value Ref Range   Glucose 145 (H) 65 - 99 mg/dL   BUN 19 8 - 27 mg/dL   Creatinine, Ser 9.16 (H) 0.76 - 1.27 mg/dL   GFR calc non Af Amer 47 (L) >59 mL/min/1.73   GFR calc Af Amer 55 (L) >59 mL/min/1.73   BUN/Creatinine Ratio 13 10 - 24   Sodium 138 134 - 144 mmol/L   Potassium 5.1 3.5 - 5.2 mmol/L   Chloride 102 96 - 106 mmol/L   CO2 23 20 - 29 mmol/L   Calcium 9.6 8.6 - 10.2 mg/dL  Bayer DCA Hb B8G Waived  Result Value Ref Range   HB A1C (BAYER DCA - WAIVED) 6.9 <7.0 %  Lipid Panel w/o Chol/HDL Ratio  Result Value Ref Range   Cholesterol, Total 150 100 - 199 mg/dL   Triglycerides 665 0 - 149 mg/dL   HDL 50 >99 mg/dL   VLDL Cholesterol Cal 26 5 - 40 mg/dL   LDL Chol Calc (NIH) 74 0 - 99 mg/dL      Assessment & Plan:  1. CKD: recheck next visit.    Ref. Range 01/24/2020 11:43 02/01/2020 10:17 05/12/2020 10:23 05/12/2020 10:24  Creatinine Latest Ref Range: 0.76 - 1.27 mg/dL 3.57 (H) 0.17 (H)  7.93 (H)    Ref. Range 01/24/2020 11:43 02/01/2020 10:17 05/12/2020 10:23 05/12/2020 10:24  GFR, Est Non African American Latest Ref Range: >59 mL/min/1.73 41 (L) 40 (L)  47 (L)  GFR, Est African American Latest Ref Range: >59 mL/min/1.73 47 (L) 46 (L)  55 (L)     2. DM :a1c a little high,  given that 75's to watch diet. Is on metformin 500 mg bid ? Needs to increase dose   diabetic diet plan given to pt  adviced regarding hypoglycemia and instructions given to pt today on how to prevent and treat the same  if it were to occur. pt acknowledges the plan and voices understanding of the same.  exercise plan given and encouraged.   advice diabetic yearly podiatry, ophthalmology , nutritionist , dental check q 6 months,  Problem List Items Addressed This Visit      Endocrine   Type 2 diabetes mellitus with chronic kidney disease, without long-term current use of insulin (HCC) - Primary   Relevant Orders   Bayer DCA Hb A1c Waived (STAT)

## 2020-08-12 LAB — BASIC METABOLIC PANEL
BUN/Creatinine Ratio: 19 (ref 10–24)
BUN: 28 mg/dL — ABNORMAL HIGH (ref 8–27)
CO2: 22 mmol/L (ref 20–29)
Calcium: 10 mg/dL (ref 8.6–10.2)
Chloride: 100 mmol/L (ref 96–106)
Creatinine, Ser: 1.44 mg/dL — ABNORMAL HIGH (ref 0.76–1.27)
Glucose: 125 mg/dL — ABNORMAL HIGH (ref 65–99)
Potassium: 5.1 mmol/L (ref 3.5–5.2)
Sodium: 139 mmol/L (ref 134–144)
eGFR: 51 mL/min/{1.73_m2} — ABNORMAL LOW (ref >59)

## 2020-08-28 DIAGNOSIS — I480 Paroxysmal atrial fibrillation: Secondary | ICD-10-CM | POA: Diagnosis not present

## 2020-08-28 DIAGNOSIS — Z8679 Personal history of other diseases of the circulatory system: Secondary | ICD-10-CM | POA: Diagnosis not present

## 2020-08-28 DIAGNOSIS — I6523 Occlusion and stenosis of bilateral carotid arteries: Secondary | ICD-10-CM | POA: Diagnosis not present

## 2020-08-28 DIAGNOSIS — I4892 Unspecified atrial flutter: Secondary | ICD-10-CM | POA: Diagnosis not present

## 2020-08-28 DIAGNOSIS — E782 Mixed hyperlipidemia: Secondary | ICD-10-CM | POA: Diagnosis not present

## 2020-08-28 DIAGNOSIS — Z9889 Other specified postprocedural states: Secondary | ICD-10-CM | POA: Diagnosis not present

## 2020-08-28 DIAGNOSIS — I10 Essential (primary) hypertension: Secondary | ICD-10-CM | POA: Diagnosis not present

## 2020-11-12 ENCOUNTER — Encounter: Payer: Self-pay | Admitting: Nurse Practitioner

## 2020-11-12 ENCOUNTER — Ambulatory Visit (INDEPENDENT_AMBULATORY_CARE_PROVIDER_SITE_OTHER): Payer: Medicare Other | Admitting: Nurse Practitioner

## 2020-11-12 ENCOUNTER — Other Ambulatory Visit: Payer: Self-pay

## 2020-11-12 VITALS — BP 134/79 | HR 80 | Temp 98.2°F | Wt 206.8 lb

## 2020-11-12 DIAGNOSIS — E785 Hyperlipidemia, unspecified: Secondary | ICD-10-CM

## 2020-11-12 DIAGNOSIS — I480 Paroxysmal atrial fibrillation: Secondary | ICD-10-CM

## 2020-11-12 DIAGNOSIS — E6609 Other obesity due to excess calories: Secondary | ICD-10-CM

## 2020-11-12 DIAGNOSIS — D696 Thrombocytopenia, unspecified: Secondary | ICD-10-CM | POA: Diagnosis not present

## 2020-11-12 DIAGNOSIS — D692 Other nonthrombocytopenic purpura: Secondary | ICD-10-CM | POA: Diagnosis not present

## 2020-11-12 DIAGNOSIS — E039 Hypothyroidism, unspecified: Secondary | ICD-10-CM | POA: Diagnosis not present

## 2020-11-12 DIAGNOSIS — E1159 Type 2 diabetes mellitus with other circulatory complications: Secondary | ICD-10-CM | POA: Diagnosis not present

## 2020-11-12 DIAGNOSIS — E1122 Type 2 diabetes mellitus with diabetic chronic kidney disease: Secondary | ICD-10-CM | POA: Diagnosis not present

## 2020-11-12 DIAGNOSIS — E1169 Type 2 diabetes mellitus with other specified complication: Secondary | ICD-10-CM | POA: Diagnosis not present

## 2020-11-12 DIAGNOSIS — Z6830 Body mass index (BMI) 30.0-30.9, adult: Secondary | ICD-10-CM

## 2020-11-12 DIAGNOSIS — I152 Hypertension secondary to endocrine disorders: Secondary | ICD-10-CM

## 2020-11-12 DIAGNOSIS — N1832 Chronic kidney disease, stage 3b: Secondary | ICD-10-CM | POA: Diagnosis not present

## 2020-11-12 DIAGNOSIS — E538 Deficiency of other specified B group vitamins: Secondary | ICD-10-CM | POA: Diagnosis not present

## 2020-11-12 LAB — MICROALBUMIN, URINE WAIVED
Creatinine, Urine Waived: 300 mg/dL (ref 10–300)
Microalb, Ur Waived: 30 mg/L — ABNORMAL HIGH (ref 0–19)
Microalb/Creat Ratio: <30 mg/g (ref <30)

## 2020-11-12 LAB — BAYER DCA HB A1C WAIVED: HB A1C (BAYER DCA - WAIVED): 6.7 % (ref <7.0)

## 2020-11-12 NOTE — Assessment & Plan Note (Signed)
Chronic, ongoing.  Recommend he monitor BP at least a few mornings a week at home and document.  DASH diet at home.  Continue current medication regimen and adjust as needed.  Labs today.  Return in 3 months.

## 2020-11-12 NOTE — Assessment & Plan Note (Signed)
Chronic, sees cardiology.  Continue current medication regimen as prescribed by cardiology at this time and adjust as needed.  Recent cardiology note reviewed -- will obtain Mag level as requested.  Continue collaboration.  Labs today CMP and CBC and Mag.

## 2020-11-12 NOTE — Patient Instructions (Signed)
Diabetes Mellitus and Nutrition, Adult When you have diabetes, or diabetes mellitus, it is very important to have healthy eating habits because your blood sugar (glucose) levels are greatly affected by what you eat and drink. Eating healthy foods in the right amounts, at about the same times every day, can help you:  Control your blood glucose.  Lower your risk of heart disease.  Improve your blood pressure.  Reach or maintain a healthy weight. What can affect my meal plan? Every person with diabetes is different, and each person has different needs for a meal plan. Your health care provider may recommend that you work with a dietitian to make a meal plan that is best for you. Your meal plan may vary depending on factors such as:  The calories you need.  The medicines you take.  Your weight.  Your blood glucose, blood pressure, and cholesterol levels.  Your activity level.  Other health conditions you have, such as heart or kidney disease. How do carbohydrates affect me? Carbohydrates, also called carbs, affect your blood glucose level more than any other type of food. Eating carbs naturally raises the amount of glucose in your blood. Carb counting is a method for keeping track of how many carbs you eat. Counting carbs is important to keep your blood glucose at a healthy level, especially if you use insulin or take certain oral diabetes medicines. It is important to know how many carbs you can safely have in each meal. This is different for every person. Your dietitian can help you calculate how many carbs you should have at each meal and for each snack. How does alcohol affect me? Alcohol can cause a sudden decrease in blood glucose (hypoglycemia), especially if you use insulin or take certain oral diabetes medicines. Hypoglycemia can be a life-threatening condition. Symptoms of hypoglycemia, such as sleepiness, dizziness, and confusion, are similar to symptoms of having too much  alcohol.  Do not drink alcohol if: ? Your health care provider tells you not to drink. ? You are pregnant, may be pregnant, or are planning to become pregnant.  If you drink alcohol: ? Do not drink on an empty stomach. ? Limit how much you use to:  0-1 drink a day for women.  0-2 drinks a day for men. ? Be aware of how much alcohol is in your drink. In the [place] one drink equals one 12 oz bottle of beer (355 mL), one 5 oz glass of wine (148 mL), or one 1 oz glass of hard liquor (44 mL). ? Keep yourself hydrated with water, diet soda, or unsweetened iced tea.  Keep in mind that regular soda, juice, and other mixers may contain a lot of sugar and must be counted as carbs. What are tips for following this plan? Reading food labels  Start by checking the serving size on the "Nutrition Facts" label of packaged foods and drinks. The amount of calories, carbs, fats, and other nutrients listed on the label is based on one serving of the item. Many items contain more than one serving per package.  Check the total grams (g) of carbs in one serving. You can calculate the number of servings of carbs in one serving by dividing the total carbs by 15. For example, if a food has 30 g of total carbs per serving, it would be equal to 2 servings of carbs.  Check the number of grams (g) of saturated fats and trans fats in one serving. Choose foods that have   a low amount or none of these fats.  Check the number of milligrams (mg) of salt (sodium) in one serving. Most people should limit total sodium intake to less than 2,300 mg per day.  Always check the nutrition information of foods labeled as "low-fat" or "nonfat." These foods may be higher in added sugar or refined carbs and should be avoided.  Talk to your dietitian to identify your daily goals for nutrients listed on the label. Shopping  Avoid buying canned, pre-made, or processed foods. These foods tend to be high in fat, sodium, and added  sugar.  Shop around the outside edge of the grocery store. This is where you will most often find fresh fruits and vegetables, bulk grains, fresh meats, and fresh dairy. Cooking  Use low-heat cooking methods, such as baking, instead of high-heat cooking methods like deep frying.  Cook using healthy oils, such as olive, canola, or sunflower oil.  Avoid cooking with butter, cream, or high-fat meats. Meal planning  Eat meals and snacks regularly, preferably at the same times every day. Avoid going long periods of time without eating.  Eat foods that are high in fiber, such as fresh fruits, vegetables, beans, and whole grains. Talk with your dietitian about how many servings of carbs you can eat at each meal.  Eat 4-6 oz (112-168 g) of lean protein each day, such as lean meat, chicken, fish, eggs, or tofu. One ounce (oz) of lean protein is equal to: ? 1 oz (28 g) of meat, chicken, or fish. ? 1 egg. ?  cup (62 g) of tofu.  Eat some foods each day that contain healthy fats, such as avocado, nuts, seeds, and fish.   What foods should I eat? Fruits Berries. Apples. Oranges. Peaches. Apricots. Plums. Grapes. Mango. Papaya. Pomegranate. Kiwi. Cherries. Vegetables Lettuce. Spinach. Leafy greens, including kale, chard, collard greens, and mustard greens. Beets. Cauliflower. Cabbage. Broccoli. Carrots. Green beans. Tomatoes. Peppers. Onions. Cucumbers. Brussels sprouts. Grains Whole grains, such as whole-wheat or whole-grain bread, crackers, tortillas, cereal, and pasta. Unsweetened oatmeal. Quinoa. Brown or wild rice. Meats and other proteins Seafood. Poultry without skin. Lean cuts of poultry and beef. Tofu. Nuts. Seeds. Dairy Low-fat or fat-free dairy products such as milk, yogurt, and cheese. The items listed above may not be a complete list of foods and beverages you can eat. Contact a dietitian for more information. What foods should I avoid? Fruits Fruits canned with  syrup. Vegetables Canned vegetables. Frozen vegetables with butter or cream sauce. Grains Refined white flour and flour products such as bread, pasta, snack foods, and cereals. Avoid all processed foods. Meats and other proteins Fatty cuts of meat. Poultry with skin. Breaded or fried meats. Processed meat. Avoid saturated fats. Dairy Full-fat yogurt, cheese, or milk. Beverages Sweetened drinks, such as soda or iced tea. The items listed above may not be a complete list of foods and beverages you should avoid. Contact a dietitian for more information. Questions to ask a health care provider  Do I need to meet with a diabetes educator?  Do I need to meet with a dietitian?  What number can I call if I have questions?  When are the best times to check my blood glucose? Where to find more information:  American Diabetes Association: diabetes.org  Academy of Nutrition and Dietetics: www.eatright.org  National Institute of Diabetes and Digestive and Kidney Diseases: www.niddk.nih.gov  Association of Diabetes Care and Education Specialists: www.diabeteseducator.org Summary  It is important to have healthy eating   habits because your blood sugar (glucose) levels are greatly affected by what you eat and drink.  A healthy meal plan will help you control your blood glucose and maintain a healthy lifestyle.  Your health care provider may recommend that you work with a dietitian to make a meal plan that is best for you.  Keep in mind that carbohydrates (carbs) and alcohol have immediate effects on your blood glucose levels. It is important to count carbs and to use alcohol carefully. This information is not intended to replace advice given to you by your health care provider. Make sure you discuss any questions you have with your health care provider. Document Revised: 04/17/2019 Document Reviewed: 04/17/2019 Elsevier Patient Education  2021 [employer].  

## 2020-11-12 NOTE — Assessment & Plan Note (Signed)
Chronic, ongoing.  Continue to collaborate with nephrology as needed and renal dose medication, Lisinopril for kidney protection.  CMP today.

## 2020-11-12 NOTE — Assessment & Plan Note (Signed)
Chronic, ongoing.  Continue with current regimen and adjust as needed based on labs.  Lipid panel today. 

## 2020-11-12 NOTE — Assessment & Plan Note (Signed)
Recheck CBC every 6 months (ordered today), no current symptoms.  Previously followed by Dr. Brahamanday.  CBC today. °

## 2020-11-12 NOTE — Assessment & Plan Note (Signed)
Lost 5 pounds from previous visit, praised for this.  BMI 30.28.  Recommended eating smaller high protein, low fat meals more frequently and exercising 30 mins a day 5 times a week with a goal of 10-15lb weight loss in the next 3 months. Patient voiced their understanding and motivation to adhere to these recommendations.

## 2020-11-12 NOTE — Assessment & Plan Note (Signed)
Ongoing with chronic Metformin use, will recheck this level today and adjust supplement as needed. 

## 2020-11-12 NOTE — Assessment & Plan Note (Signed)
Chronic, ongoing. Continue current medication regimen and adjust as needed based on labs.  TSH and Free T4 today. 

## 2020-11-12 NOTE — Assessment & Plan Note (Signed)
Chronic, ongoing.  A1C 6.7% today, downward trend -- praised for this.  Continue Trulicity 4.5 MG weekly + continue Metformin ER 500 MG BID.   Renal function limits medications that can be used + cost -- donut hole issues with Medicare.  Recommend he check blood sugar twice a day, in morning fasting and 2 hours after a meal, document these.  Collaborate with CCM team.  Return in 3 months.

## 2020-11-12 NOTE — Assessment & Plan Note (Signed)
Noted on exam, on anticoagulant therapy for a-fib.  Recommend gentle skin care at home and monitor for wounds, if present then immediately notify provider. 

## 2020-11-12 NOTE — Progress Notes (Signed)
BP 134/79   Pulse 80   Temp 98.2 F (36.8 C) (Oral)   Wt 206 lb 12.8 oz (93.8 kg)   SpO2 97%   BMI 30.28 kg/m    Subjective:    Patient ID: Jonathan [name], male    DOB: 1945/07/10, 75 y.o.   MRN: [id number]  HPI: Jonathan [name] is a 75 y.o. male  Chief Complaint  Patient presents with   Diabetes    Requested Diabetic Eye Exam at today's visit from [facility].    Hyperlipidemia   Hypertension   DIABETES A1c 7.4% in March. Currently taking Trulicity 4.5 MG weekly -- $271 a month with donut hole -- has talked to [facility] pharmacist and no assistance available.  Also taking Metformin XR 500 MG BID. Reports he has been trying to lose weight and focused on diet --1 to 1.5 miles a day. Tried Invokana, but was discontinued in past due to kidney function. Hypoglycemic episodes:no Polydipsia/polyuria: no Visual disturbance: no Chest pain: no Paresthesias: no Glucose Monitoring: yes             Accucheck frequency: weekly             Fasting glucose: 120 range             Post prandial: 140-150 range             Evening:              Before meals: Taking Insulin?: no             Long acting insulin:             Short acting insulin: Blood Pressure Monitoring: daily Retinal Examination: Not Up to date -- goes every 2 years Foot Exam: Up to Date Pneumovax: Up to Date Influenza: Up to Date Aspirin: no   CHRONIC KIDNEY DISEASE Last labs GFR 51 and CRT 1.44.  Has ongoing low platelet to monitor every 6 months, most recent December 2021 was 131.  He also takes B12 daily for history of low levels-- recent level 971 in February 2021. CKD status: stable Medications renally dose: yes Previous renal evaluation: yes Pneumovax:  Up to Date Influenza Vaccine:  Up to Date  ATRIAL FIBRILLATION Saw Dr. Nehemiah Massed last 04/03/20 -- he continues Metoprolol 25 MG BID, Eliquis, Flecainide 100 MG BID, Lisinopril. Continues on Atorvastatin for HLD.  Cardiology would like Mag level checked  today due to Flecainide, noted on review recent note.  He reports lack of energy with Flecainide and Metoprolol -- he failed recent ablations.    Had seen Dr. Marcello Moores from EP last year about left atrial appendage closure device, but has opted not to do this at this time. Last EF was 50% with mild LVH in October 2020.   Atrial fibrillation status: controlled Satisfied with current treatment: stable Medication side effects:  no Medication compliance: good compliance Etiology of atrial fibrillation:  Palpitations:  no Chest pain:  no Dyspnea on exertion:  no Orthopnea:  no Syncope:  no Edema:  no Ventricular rate control: B-blocker Anti-coagulation: long acting  HYPOTHYROIDISM Continues Levothyroxine 125 MCG daily.  Last TSH in February 2021 was 1.080. Thyroid control status:controlled Satisfied with current treatment? no Medication side effects: no Medication compliance: good compliance Etiology of hypothyroidism:  Recent dose adjustment:no Fatigue: no Cold intolerance: no Heat intolerance: no Weight gain: no Weight loss: no Constipation: no Diarrhea/loose stools: no Palpitations: no Lower extremity edema: no Anxiety/depressed mood: no  Relevant past medical, surgical, family and social history reviewed and updated as indicated. Interim medical history since our last visit reviewed. Allergies and medications reviewed and updated.  Review of Systems  Constitutional:  Positive for fatigue (with medications). Negative for activity change, diaphoresis and fever.  Respiratory:  Negative for cough, chest tightness, shortness of breath and wheezing.   Cardiovascular:  Negative for chest pain, palpitations and leg swelling.  Gastrointestinal: Negative.   Endocrine: Negative for cold intolerance, heat intolerance, polydipsia, polyphagia and polyuria.  Neurological: Negative.   Psychiatric/Behavioral: Negative.     Per HPI unless specifically indicated above     Objective:     BP 134/79   Pulse 80   Temp 98.2 F (36.8 C) (Oral)   Wt 206 lb 12.8 oz (93.8 kg)   SpO2 97%   BMI 30.28 kg/m   Wt Readings from Last 3 Encounters:  11/12/20 206 lb 12.8 oz (93.8 kg)  08/11/20 211 lb (95.7 kg)  05/30/20 210 lb (95.3 kg)    Physical Exam Vitals and nursing note reviewed.  Constitutional:      General: He is awake. He is not in acute distress.    Appearance: He is well-developed, well-groomed and overweight. He is not ill-appearing.  HENT:     Head: Normocephalic and atraumatic.     Right Ear: Hearing normal. No drainage.     Left Ear: Hearing normal. No drainage.  Eyes:     General: Lids are normal.        Right eye: No discharge.        Left eye: No discharge.     Conjunctiva/sclera: Conjunctivae normal.     Pupils: Pupils are equal, round, and reactive to light.  Neck:     Trachea: Trachea normal.  Cardiovascular:     Rate and Rhythm: Normal rate and regular rhythm.     Heart sounds: Normal heart sounds, S1 normal and S2 normal. No murmur heard.   No gallop.  Pulmonary:     Effort: Pulmonary effort is normal. No accessory muscle usage or respiratory distress.     Breath sounds: Normal breath sounds.  Abdominal:     General: Bowel sounds are normal.     Palpations: Abdomen is soft.  Musculoskeletal:        General: Normal range of motion.     Cervical back: Normal range of motion and neck supple.     Right lower leg: No edema.     Left lower leg: No edema.  Skin:    General: Skin is warm and dry.     Comments: Scattered small pale bruises to bilateral upper extremities.    Neurological:     Mental Status: He is alert and oriented to person, place, and time.  Psychiatric:        Attention and Perception: Attention normal.        Mood and Affect: Mood normal.        Speech: Speech normal.        Behavior: Behavior normal. Behavior is cooperative.        Thought Content: Thought content normal.    Results for orders placed or performed in  visit on 08/11/20  Bayer DCA Hb A1c Waived (STAT)  Result Value Ref Range   HB A1C (BAYER DCA - WAIVED) 7.4 (H) <1.6 %  Basic metabolic panel  Result Value Ref Range   Glucose 125 (H) 65 - 99 mg/dL   BUN 28 (H) 8 - 27  mg/dL   Creatinine, Ser 1.44 (H) 0.76 - 1.27 mg/dL   eGFR 51 (L) >59 mL/min/1.73   BUN/Creatinine Ratio 19 10 - 24   Sodium 139 134 - 144 mmol/L   Potassium 5.1 3.5 - 5.2 mmol/L   Chloride 100 96 - 106 mmol/L   CO2 22 20 - 29 mmol/L   Calcium 10.0 8.6 - 10.2 mg/dL  Urinalysis, Routine w reflex microscopic  Result Value Ref Range   Specific Gravity, UA 1.015 1.005 - 1.030   pH, UA 6.0 5.0 - 7.5   Color, UA Yellow Yellow   Appearance Ur Clear Clear   Leukocytes,UA Negative Negative   Protein,UA Negative Negative/Trace   Glucose, UA Negative Negative   Ketones, UA Negative Negative   RBC, UA Negative Negative   Bilirubin, UA Negative Negative   Urobilinogen, Ur 0.2 0.2 - 1.0 mg/dL   Nitrite, UA Negative Negative      Assessment & Plan:   Problem List Items Addressed This Visit       Cardiovascular and Mediastinum   Hypertension associated with diabetes (HCC)    Chronic, ongoing.  Recommend he monitor BP at least a few mornings a week at home and document.  DASH diet at home.  Continue current medication regimen and adjust as needed.  Labs today.  Return in 3 months.       Relevant Medications   metoprolol tartrate (LOPRESSOR) 25 MG tablet   Other Relevant Orders   Bayer DCA Hb A1c Waived   Microalbumin, Urine Waived   Comprehensive metabolic panel   Paroxysmal A-fib (HCC)    Chronic, sees cardiology.  Continue current medication regimen as prescribed by cardiology at this time and adjust as needed.  Recent cardiology note reviewed -- will obtain Mag level as requested.  Continue collaboration.  Labs today CMP and CBC and Mag.       Relevant Medications   metoprolol tartrate (LOPRESSOR) 25 MG tablet   Other Relevant Orders   Magnesium   Senile  purpura ([place])    Noted on exam, on anticoagulant therapy for a-fib.  Recommend gentle skin care at home and monitor for wounds, if present then immediately notify provider.       Relevant Medications   metoprolol tartrate (LOPRESSOR) 25 MG tablet     Endocrine   Hypothyroidism    Chronic, ongoing.  Continue current medication regimen and adjust as needed based on labs.  TSH and Free T 4 today.       Relevant Medications   metoprolol tartrate (LOPRESSOR) 25 MG tablet   Other Relevant Orders   TSH   T4, free   Hyperlipidemia associated with type 2 diabetes mellitus (HCC)    Chronic, ongoing.  Continue with current regimen and adjust as needed based on labs.  Lipid panel today.       Relevant Orders   Bayer DCA Hb A1c Waived   Lipid Panel w/o Chol/HDL Ratio   Type 2 diabetes mellitus with chronic kidney disease, without long-term current use of insulin (HCC) - Primary    Chronic, ongoing.  A1C 6.7% today, downward trend -- praised for this.  Continue Trulicity 4.5 MG weekly + continue Metformin ER 500 MG BID.   Renal function limits medications that can be used + cost -- donut hole issues with Medicare.  Recommend he check blood sugar twice a day, in morning fasting and 2 hours after a meal, document these.  Collaborate with CCM team.  Return in 3 months.  Relevant Orders   Bayer DCA Hb A1c Waived     Genitourinary   CKD (chronic kidney disease), stage III (HCC)    Chronic, ongoing.  Continue to collaborate with nephrology as needed and renal dose medication, Lisinopril for kidney protection.  CMP today.         Other   Obesity    Lost 5 pounds from previous visit, praised for this.  BMI 30.28.  Recommended eating smaller high protein, low fat meals more frequently and exercising 30 mins a day 5 times a week with a goal of 10-15lb weight loss in the next 3 months. Patient voiced their understanding and motivation to adhere to these recommendations.         Thrombocytopenia (HCC)    Recheck CBC every 6 months (ordered today), no current symptoms.  Previously followed by Dr. Tish Men.  CBC today.       Relevant Orders   CBC with Differential/Platelet   Vitamin B12 deficiency    Ongoing with chronic Metformin use, will recheck this level today and adjust supplement as needed.       Relevant Orders   CBC with Differential/Platelet   Vitamin B12     Follow up plan: Return in about 3 months (around 02/12/2021) for T2DM, HTN/HLD, THYROID.

## 2020-11-13 LAB — CBC WITH DIFFERENTIAL/PLATELET
Basophils Absolute: 0 10*3/uL (ref 0.0–0.2)
Basos: 1 %
EOS (ABSOLUTE): 0.1 10*3/uL (ref 0.0–0.4)
Eos: 3 %
Hematocrit: 38.1 % (ref 37.5–51.0)
Hemoglobin: 13.4 g/dL (ref 13.0–17.7)
Immature Grans (Abs): 0 10*3/uL (ref 0.0–0.1)
Immature Granulocytes: 0 %
Lymphocytes Absolute: 1.2 10*3/uL (ref 0.7–3.1)
Lymphs: 27 %
MCH: 30.2 pg (ref 26.6–33.0)
MCHC: 35.2 g/dL (ref 31.5–35.7)
MCV: 86 fL (ref 79–97)
Monocytes Absolute: 0.5 10*3/uL (ref 0.1–0.9)
Monocytes: 11 %
Neutrophils Absolute: 2.6 10*3/uL (ref 1.4–7.0)
Neutrophils: 58 %
Platelets: 121 10*3/uL — ABNORMAL LOW (ref 150–450)
RBC: 4.44 x10E6/uL (ref 4.14–5.80)
RDW: 12.6 % (ref 11.6–15.4)
WBC: 4.4 10*3/uL (ref 3.4–10.8)

## 2020-11-13 LAB — COMPREHENSIVE METABOLIC PANEL
ALT: 10 IU/L (ref 0–44)
AST: 18 IU/L (ref 0–40)
Albumin/Globulin Ratio: 2.2 (ref 1.2–2.2)
Albumin: 4.9 g/dL — ABNORMAL HIGH (ref 3.7–4.7)
Alkaline Phosphatase: 98 IU/L (ref 44–121)
BUN/Creatinine Ratio: 14 (ref 10–24)
BUN: 22 mg/dL (ref 8–27)
Bilirubin Total: 0.6 mg/dL (ref 0.0–1.2)
CO2: 23 mmol/L (ref 20–29)
Calcium: 9.8 mg/dL (ref 8.6–10.2)
Chloride: 102 mmol/L (ref 96–106)
Creatinine, Ser: 1.6 mg/dL — ABNORMAL HIGH (ref 0.76–1.27)
Globulin, Total: 2.2 g/dL (ref 1.5–4.5)
Glucose: 126 mg/dL — ABNORMAL HIGH (ref 65–99)
Potassium: 5.2 mmol/L (ref 3.5–5.2)
Sodium: 138 mmol/L (ref 134–144)
Total Protein: 7.1 g/dL (ref 6.0–8.5)
eGFR: 45 mL/min/{1.73_m2} — ABNORMAL LOW (ref >59)

## 2020-11-13 LAB — MAGNESIUM: Magnesium: 1.6 mg/dL (ref 1.6–2.3)

## 2020-11-13 LAB — T4, FREE: Free T4: 1.62 ng/dL (ref 0.82–1.77)

## 2020-11-13 LAB — LIPID PANEL W/O CHOL/HDL RATIO
Cholesterol, Total: 146 mg/dL (ref 100–199)
HDL: 48 mg/dL (ref >39)
LDL Chol Calc (NIH): 75 mg/dL (ref 0–99)
Triglycerides: 130 mg/dL (ref 0–149)
VLDL Cholesterol Cal: 23 mg/dL (ref 5–40)

## 2020-11-13 LAB — TSH: TSH: 0.495 u[IU]/mL (ref 0.450–4.500)

## 2020-11-13 LAB — VITAMIN B12: Vitamin B-12: 983 pg/mL (ref 232–1245)

## 2020-11-13 NOTE — Progress Notes (Signed)
Contacted via MyChart   Good evening Jonathan [name], your labs have returned.   - Kidney function, creatinine and eGFR, remain baseline for you kidney disease wise with no worsening.  Liver function, AST and ALT, are normal. - Cholesterol levels are normal, continue your statin. - Thyroid labs are normal.  Continue current Levothyroxine dosing. - CBC shows no anemia and baseline low platelets with no worsening. - B12 and magnesium level normal.  Any questions? Keep being awesome!!  Thank you for allowing me to participate in your care.  I appreciate you. Kindest regards, Divya Munshi

## 2021-01-01 DIAGNOSIS — E782 Mixed hyperlipidemia: Secondary | ICD-10-CM | POA: Diagnosis not present

## 2021-01-01 DIAGNOSIS — I10 Essential (primary) hypertension: Secondary | ICD-10-CM | POA: Diagnosis not present

## 2021-01-01 DIAGNOSIS — I6523 Occlusion and stenosis of bilateral carotid arteries: Secondary | ICD-10-CM | POA: Diagnosis not present

## 2021-01-01 DIAGNOSIS — I480 Paroxysmal atrial fibrillation: Secondary | ICD-10-CM | POA: Diagnosis not present

## 2021-01-01 DIAGNOSIS — Z79899 Other long term (current) drug therapy: Secondary | ICD-10-CM | POA: Diagnosis not present

## 2021-01-01 DIAGNOSIS — Z5181 Encounter for therapeutic drug level monitoring: Secondary | ICD-10-CM | POA: Diagnosis not present

## 2021-01-21 DIAGNOSIS — E119 Type 2 diabetes mellitus without complications: Secondary | ICD-10-CM | POA: Diagnosis not present

## 2021-01-21 LAB — HM DIABETES EYE EXAM

## 2021-02-09 DIAGNOSIS — I6523 Occlusion and stenosis of bilateral carotid arteries: Secondary | ICD-10-CM | POA: Diagnosis not present

## 2021-02-09 DIAGNOSIS — I480 Paroxysmal atrial fibrillation: Secondary | ICD-10-CM | POA: Diagnosis not present

## 2021-02-09 DIAGNOSIS — E782 Mixed hyperlipidemia: Secondary | ICD-10-CM | POA: Diagnosis not present

## 2021-02-09 DIAGNOSIS — I10 Essential (primary) hypertension: Secondary | ICD-10-CM | POA: Diagnosis not present

## 2021-02-12 ENCOUNTER — Encounter: Payer: Self-pay | Admitting: Nurse Practitioner

## 2021-02-12 ENCOUNTER — Ambulatory Visit (INDEPENDENT_AMBULATORY_CARE_PROVIDER_SITE_OTHER): Payer: Medicare Other | Admitting: Nurse Practitioner

## 2021-02-12 ENCOUNTER — Other Ambulatory Visit: Payer: Self-pay

## 2021-02-12 VITALS — BP 103/68 | HR 80 | Temp 98.3°F | Wt 207.8 lb

## 2021-02-12 DIAGNOSIS — E039 Hypothyroidism, unspecified: Secondary | ICD-10-CM

## 2021-02-12 DIAGNOSIS — N1832 Chronic kidney disease, stage 3b: Secondary | ICD-10-CM

## 2021-02-12 DIAGNOSIS — Z23 Encounter for immunization: Secondary | ICD-10-CM

## 2021-02-12 DIAGNOSIS — E785 Hyperlipidemia, unspecified: Secondary | ICD-10-CM

## 2021-02-12 DIAGNOSIS — I152 Hypertension secondary to endocrine disorders: Secondary | ICD-10-CM | POA: Diagnosis not present

## 2021-02-12 DIAGNOSIS — E1159 Type 2 diabetes mellitus with other circulatory complications: Secondary | ICD-10-CM

## 2021-02-12 DIAGNOSIS — E1169 Type 2 diabetes mellitus with other specified complication: Secondary | ICD-10-CM

## 2021-02-12 DIAGNOSIS — E1122 Type 2 diabetes mellitus with diabetic chronic kidney disease: Secondary | ICD-10-CM

## 2021-02-12 DIAGNOSIS — D692 Other nonthrombocytopenic purpura: Secondary | ICD-10-CM | POA: Diagnosis not present

## 2021-02-12 DIAGNOSIS — E6609 Other obesity due to excess calories: Secondary | ICD-10-CM

## 2021-02-12 DIAGNOSIS — Z6830 Body mass index (BMI) 30.0-30.9, adult: Secondary | ICD-10-CM

## 2021-02-12 DIAGNOSIS — Z1211 Encounter for screening for malignant neoplasm of colon: Secondary | ICD-10-CM | POA: Diagnosis not present

## 2021-02-12 DIAGNOSIS — I480 Paroxysmal atrial fibrillation: Secondary | ICD-10-CM

## 2021-02-12 LAB — BAYER DCA HB A1C WAIVED: HB A1C (BAYER DCA - WAIVED): 7 % — ABNORMAL HIGH (ref 4.8–5.6)

## 2021-02-12 NOTE — Assessment & Plan Note (Signed)
Noted on exam, on anticoagulant therapy for a-fib.  Recommend gentle skin care at home and monitor for wounds, if present then immediately notify provider. 

## 2021-02-12 NOTE — Assessment & Plan Note (Signed)
Chronic, sees cardiology.  Continue current medication regimen as prescribed by cardiology at this time and adjust as needed.  Recent cardiology note reviewed.  Continue collaboration.   °

## 2021-02-12 NOTE — Progress Notes (Addendum)
BP 103/68   Pulse 80   Temp 98.3 F (36.8 C) (Oral)   Wt 207 lb 12.8 oz (94.3 kg)   SpO2 97%   BMI 30.43 kg/m    Subjective:    Patient ID: Jonathan [name], male    DOB: [birth year], 75 y.o.   MRN: [id number]  HPI: Jonathan [name] is a 75 y.o. male  Chief Complaint  Patient presents with   Diabetes    Patient is here for 3 month follow up. Patient denies having any concerns at today's visit.    Hyperlipidemia   Hypertension   Hypothyroidism   DIABETES A1c 6.7% in June. Currently taking Trulicity 4.5 MG weekly and Metformin XR 500 MG BID. Reports he has been trying to lose weight and focused on diet --1 to 1.5 miles a day. Tried Invokana, but was discontinued in past due to kidney function. Hypoglycemic episodes:no Polydipsia/polyuria: no Visual disturbance: no Chest pain: no Paresthesias: no Glucose Monitoring: yes             Accucheck frequency: weekly             Fasting glucose: 140-150 range             Post prandial:              Evening:              Before meals: Taking Insulin?: no             Long acting insulin:             Short acting insulin: Blood Pressure Monitoring: daily Retinal Examination: Not Up to date -- recent visit Foot Exam: Up to Date Pneumovax: Up to Date Influenza: Up to Date Aspirin: no   HYPERTENSION / HYPERLIPIDEMIA Satisfied with current treatment? yes Duration of hypertension: chronic BP monitoring frequency: not checking BP range:  BP medication side effects: no Duration of hyperlipidemia: chronic Cholesterol medication side effects: no Cholesterol supplements: none Medication compliance: good compliance Aspirin: no Recent stressors: no Recurrent headaches: no Visual changes: no Palpitations: no Dyspnea: no Chest pain: no Lower extremity edema: no Dizzy/lightheaded: no    CHRONIC KIDNEY DISEASE CKD status: stable Medications renally dose: yes Previous renal evaluation: yes Pneumovax:  Up to Date Influenza  Vaccine:  Up to Date   ATRIAL FIBRILLATION Saw Dr. Gwen Pounds last 01/01/21 -- he continues Metoprolol 25 MG BID, Eliquis, Flecainide 100 MG BID, Lisinopril. Continues on Atorvastatin for HLD.  Had recent stress testing and echo, no results in chart yet.     Had seen Dr. Maisie Fus from EP last year about left atrial appendage closure device, but has opted not to do this at this time. Last EF was 50% with mild LVH in October 2020.   Atrial fibrillation status: controlled Satisfied with current treatment: stable Medication side effects:  no Medication compliance: good compliance Etiology of atrial fibrillation:  Palpitations:  no Chest pain:  no Dyspnea on exertion:  no Orthopnea:  no Syncope:  no Edema:  no Ventricular rate control: B-blocker Anti-coagulation: long acting   HYPOTHYROIDISM Continues Levothyroxine 125 MCG daily.   Thyroid control status:controlled Satisfied with current treatment? no Medication side effects: no Medication compliance: good compliance Etiology of hypothyroidism:  Recent dose adjustment:no Fatigue: no Cold intolerance: no Heat intolerance: no Weight gain: no Weight loss: no Constipation: no Diarrhea/loose stools: no Palpitations: no Lower extremity edema: no Anxiety/depressed mood: no   Relevant past medical, surgical, family and social  history reviewed and updated as indicated. Interim medical history since our last visit reviewed. Allergies and medications reviewed and updated.  Review of Systems  Constitutional:  Negative for activity change, diaphoresis, fatigue and fever.  Respiratory:  Negative for cough, chest tightness, shortness of breath and wheezing.   Cardiovascular:  Negative for chest pain, palpitations and leg swelling.  Gastrointestinal: Negative.   Endocrine: Negative for cold intolerance, heat intolerance, polydipsia, polyphagia and polyuria.  Neurological: Negative.   Psychiatric/Behavioral: Negative.     Per HPI unless  specifically indicated above     Objective:    BP 103/68   Pulse 80   Temp 98.3 F (36.8 C) (Oral)   Wt 207 lb 12.8 oz (94.3 kg)   SpO2 97%   BMI 30.43 kg/m   Wt Readings from Last 3 Encounters:  02/12/21 207 lb 12.8 oz (94.3 kg)  11/12/20 206 lb 12.8 oz (93.8 kg)  08/11/20 211 lb (95.7 kg)    Physical Exam Vitals and nursing note reviewed.  Constitutional:      General: He is awake. He is not in acute distress.    Appearance: He is well-developed, well-groomed and overweight. He is not ill-appearing.  HENT:     Head: Normocephalic and atraumatic.     Right Ear: Hearing normal. No drainage.     Left Ear: Hearing normal. No drainage.  Eyes:     General: Lids are normal.        Right eye: No discharge.        Left eye: No discharge.     Conjunctiva/sclera: Conjunctivae normal.     Pupils: Pupils are equal, round, and reactive to light.  Neck:     Trachea: Trachea normal.  Cardiovascular:     Rate and Rhythm: Normal rate and regular rhythm.     Heart sounds: Normal heart sounds, S1 normal and S2 normal. No murmur heard.   No gallop.  Pulmonary:     Effort: Pulmonary effort is normal. No accessory muscle usage or respiratory distress.     Breath sounds: Normal breath sounds.  Abdominal:     General: Bowel sounds are normal.     Palpations: Abdomen is soft. There is no hepatomegaly or splenomegaly.  Musculoskeletal:        General: Normal range of motion.     Cervical back: Normal range of motion and neck supple.     Right lower leg: No edema.     Left lower leg: No edema.  Skin:    General: Skin is warm and dry.  Neurological:     Mental Status: He is alert and oriented to person, place, and time.  Psychiatric:        Attention and Perception: Attention normal.        Mood and Affect: Mood normal.        Speech: Speech normal.        Behavior: Behavior normal. Behavior is cooperative.        Thought Content: Thought content normal.   Diabetic Foot Exam -  Simple   Simple Foot Form Visual Inspection See comments: Yes Sensation Testing Intact to touch and monofilament testing bilaterally: Yes Pulse Check Posterior Tibialis and Dorsalis pulse intact bilaterally: Yes Comments Bunions bilaterally.      Results for orders placed or performed in visit on 02/03/21  HM DIABETES EYE EXAM  Result Value Ref Range   HM Diabetic Eye Exam No Retinopathy No Retinopathy      Assessment &  Plan:   Problem List Items Addressed This Visit       Cardiovascular and Mediastinum   Hypertension associated with diabetes (HCC)    Chronic, ongoing. BP at goal in office and at home -- recommend he alert if consistently low and symptoms present.  Recommend he monitor BP at least a few mornings a week at home and document.  DASH diet at home.  Continue current medication regimen and adjust as needed.  Labs today: A1c.  Return in 3 months.      Relevant Orders   Bayer DCA Hb A1c Waived   Paroxysmal A-fib (HCC)    Chronic, sees cardiology.  Continue current medication regimen as prescribed by cardiology at this time and adjust as needed.  Recent cardiology note reviewed.  Continue collaboration.        Senile purpura (HCC)    Noted on exam, on anticoagulant therapy for a-fib.  Recommend gentle skin care at home and monitor for wounds, if present then immediately notify provider.        Endocrine   Hypothyroidism    Chronic, ongoing.  Continue current medication regimen and adjust as needed based on labs.  Thyroid labs stable recent check.      Hyperlipidemia associated with type 2 diabetes mellitus (HCC)    Chronic, ongoing.  Continue with current regimen and adjust as needed based on labs.  Lipid panel up to date.      Relevant Orders   Bayer DCA Hb A1c Waived   Type 2 diabetes mellitus with chronic kidney disease, without long-term current use of insulin (HCC) - Primary    Chronic, ongoing.  A1C 7% today, mild upward trend.  Continue Trulicity 4.5  MG weekly + continue Metformin ER 500 MG BID.   Renal function limits medications that can be used + cost -- donut hole issues with Medicare.  Recommend he check blood sugar twice a day, in morning fasting and 2 hours after a meal, document these.  Collaborate with CCM team.  Return in 3 months.      Relevant Orders   Bayer DCA Hb A1c Waived     Genitourinary   CKD (chronic kidney disease), stage III (HCC)    Chronic, ongoing.  Continue to collaborate with nephrology as needed and renal dose medication, Lisinopril for kidney protection.  CMP up to date, recheck next visit.        Other   Obesity    BMI 30.43.  Recommended eating smaller high protein, low fat meals more frequently and exercising 30 mins a day 5 times a week with a goal of 10-15lb weight loss in the next 3 months. Patient voiced their understanding and motivation to adhere to these recommendations.       Other Visit Diagnoses     Flu vaccine need       Flu vaccine today   Relevant Orders   Flu Vaccine QUAD High Dose(Fluad) (Completed)   Colon cancer screening       Cologuard ordered   Relevant Orders   Cologuard        Follow up plan: Return in about 3 months (around 05/14/2021) for T2DM, HTN/HLD, THYROID, CKD.

## 2021-02-12 NOTE — Patient Instructions (Signed)
Atrial Fibrillation °Atrial fibrillation is a type of heartbeat that is irregular or fast. If you have this condition, your heart beats without any order. This makes it hard for your heart to pump blood in a normal way. °Atrial fibrillation may come and go, or it may become a long-lasting problem. If this condition is not treated, it can put you at higher risk for stroke, heart failure, and other heart problems. °What are the causes? °This condition may be caused by diseases that damage the heart. They include: °High blood pressure. °Heart failure. °Heart valve disease. °Heart surgery. °Other causes include: °Diabetes. °Thyroid disease. °Being overweight. °Kidney disease. °Sometimes the cause is not known. °What increases the risk? °You are more likely to develop this condition if: °You are older. °You smoke. °You exercise often and very hard. °You have a family history of this condition. °You are a man. °You use drugs. °You drink a lot of alcohol. °You have lung conditions, such as emphysema, pneumonia, or COPD. °You have sleep apnea. °What are the signs or symptoms? °Common symptoms of this condition include: °A feeling that your heart is beating very fast. °Chest pain or discomfort. °Feeling short of breath. °Suddenly feeling light-headed or weak. °Getting tired easily during activity. °Fainting. °Sweating. °In some cases, there are no symptoms. °How is this treated? °Treatment for this condition depends on underlying conditions and how you feel when you have atrial fibrillation. They include: °Medicines to: °Prevent blood clots. °Treat heart rate or heart rhythm problems. °Using devices, such as a pacemaker, to correct heart rhythm problems. °Doing surgery to remove the part of the heart that sends bad signals. °Closing an area where clots can form in the heart (left atrial appendage). °In some cases, your doctor will treat other underlying conditions. °Follow these instructions at home: °Medicines °Take  over-the-counter and prescription medicines only as told by your doctor. °Do not take any new medicines without first talking to your doctor. °If you are taking blood thinners: °Talk with your doctor before you take any medicines that have aspirin or NSAIDs, such as ibuprofen, in them. °Take your medicine exactly as told by your doctor. Take it at the same time each day. °Avoid activities that could hurt or bruise you. Follow instructions about how to prevent falls. °Wear a bracelet that says you are taking blood thinners. Or, carry a card that lists what medicines you take. °Lifestyle °  °Do not use any products that have nicotine or tobacco in them. These include cigarettes, e-cigarettes, and chewing tobacco. If you need help quitting, ask your doctor. °Eat heart-healthy foods. Talk with your doctor about the right eating plan for you. °Exercise regularly as told by your doctor. °Do not drink alcohol. °Lose weight if you are overweight. °Do not use drugs, including cannabis. °General instructions °If you have a condition that causes breathing to stop for a short period of time (apnea), treat it as told by your doctor. °Keep a healthy weight. Do not use diet pills unless your doctor says they are safe for you. Diet pills may make heart problems worse. °Keep all follow-up visits as told by your doctor. This is important. °Contact a doctor if: °You notice a change in the speed, rhythm, or strength of your heartbeat. °You are taking a blood-thinning medicine and you get more bruising. °You get tired more easily when you move or exercise. °You have a sudden change in weight. °Get help right away if: ° °You have pain in your chest or   your belly (abdomen). °You have trouble breathing. °You have side effects of blood thinners, such as blood in your vomit, poop (stool), or pee (urine), or bleeding that cannot stop. °You have any signs of a stroke. "BE FAST" is an easy way to remember the main warning signs: °B - Balance.  Signs are dizziness, sudden trouble walking, or loss of balance. °E - Eyes. Signs are trouble seeing or a change in how you see. °F - Face. Signs are sudden weakness or loss of feeling in the face, or the face or eyelid drooping on one side. °A - Arms. Signs are weakness or loss of feeling in an arm. This happens suddenly and usually on one side of the body. °S - Speech. Signs are sudden trouble speaking, slurred speech, or trouble understanding what people say. °T - Time. Time to call emergency services. Write down what time symptoms started. °You have other signs of a stroke, such as: °A sudden, very bad headache with no known cause. °Feeling like you may vomit (nausea). °Vomiting. °A seizure. °These symptoms may be an emergency. Do not wait to see if the symptoms will go away. Get medical help right away. Call your local emergency services (911 in the [place] Do not drive yourself to the hospital. °Summary °Atrial fibrillation is a type of heartbeat that is irregular or fast. °You are at higher risk of this condition if you smoke, are older, have diabetes, or are overweight. °Follow your doctor's instructions about medicines, diet, exercise, and follow-up visits. °Get help right away if you have signs or symptoms of a stroke. °Get help right away if you cannot catch your breath, or you have chest pain or discomfort. °This information is not intended to replace advice given to you by your health care provider. Make sure you discuss any questions you have with your health care provider. °Document Revised: 11/01/2018 Document Reviewed: 11/01/2018 °Elsevier Patient Education © 2022 [employer] ° °

## 2021-02-12 NOTE — Assessment & Plan Note (Signed)
Chronic, ongoing.  Continue to collaborate with nephrology as needed and renal dose medication, Lisinopril for kidney protection.  CMP up to date, recheck next visit.

## 2021-02-12 NOTE — Progress Notes (Deleted)
BP 103/68   Pulse 80   Temp 98.3 F (36.8 C) (Oral)   Wt 207 lb 12.8 oz (94.3 kg)   SpO2 97%   BMI 30.43 kg/m    Subjective:    Patient ID: Jonathan [name], male    DOB: 07-Sep-1945, 75 y.o.   MRN: [id number]  HPI: Jonathan [name] is a 75 y.o. male  Chief Complaint  Patient presents with   Diabetes    Patient is here for 3 month follow up. Patient denies having any concerns at today's visit.    Hyperlipidemia   Hypertension   Hypothyroidism   DIABETES A1c 6.7% June. Currently taking Trulicity 4.5 MG weekly and Metformin XR 500 MG BID. Been been trying to lose weight and focused on diet --1 to 1.5 miles a day. Tried Invokana, but was discontinued in past due to kidney function. Hypoglycemic episodes:no Polydipsia/polyuria: no Visual disturbance: no Chest pain: no Paresthesias: no Glucose Monitoring: yes             Accucheck frequency: weekly             Fasting glucose: 130 or so in morning             Post prandial: 140-150 range             Evening: occasional 100-110             Before meals: Taking Insulin?: no             Long acting insulin:             Short acting insulin: Blood Pressure Monitoring: daily Retinal Examination: Not Up to date -- August 31st [facility] Foot Exam: Up to Date Pneumovax: Up to Date Influenza: Up to Date Aspirin: no   HYPERTENSION / HYPERLIPIDEMIA Satisfied with current treatment? yes Duration of hypertension: chronic BP monitoring frequency: daily BP range: 110/70 range on average with occasional lower levels BP medication side effects: no Duration of hyperlipidemia: chronic Cholesterol medication side effects: no Cholesterol supplements: none Medication compliance: good compliance Aspirin: no Recent stressors: no Recurrent headaches: no Visual changes: no Palpitations: no Dyspnea: no Chest pain: no Lower extremity edema: no Dizzy/lightheaded: no   CHRONIC KIDNEY DISEASE CKD status: stable Medications renally  dose: yes Previous renal evaluation: yes Pneumovax:  Up to Date Influenza Vaccine:  Up to Date  ATRIAL FIBRILLATION Saw Dr. Gwen Pounds last 01/01/21 -- he continues Metoprolol 25 MG BID, Eliquis, Flecainide 100 MG BID, Lisinopril. Continues on Atorvastatin for HLD.  He has recent stress test, echo -- do not have results as of yet. Last EF was 50% with mild LVH in October 2020.    Had seen Dr. Maisie Fus from EP last year about left atrial appendage closure device, but has opted not to do this at this time.  Atrial fibrillation status: controlled Satisfied with current treatment: stable Medication side effects:  no Medication compliance: good compliance Etiology of atrial fibrillation:  Palpitations:  no Chest pain:  no Dyspnea on exertion:  no Orthopnea:  no Syncope:  no Edema:  no Ventricular rate control: B-blocker Anti-coagulation: long acting  HYPOTHYROIDISM Continues Levothyroxine 125 MCG daily.   Thyroid control status:controlled Satisfied with current treatment? no Medication side effects: no Medication compliance: good compliance Etiology of hypothyroidism:  Recent dose adjustment:no Fatigue: no Cold intolerance: no Heat intolerance: no Weight gain: no Weight loss: no Constipation: no Diarrhea/loose stools: no Palpitations: no Lower extremity edema: no Anxiety/depressed mood: no  Relevant past medical, surgical, family and social history reviewed and updated as indicated. Interim medical history since our last visit reviewed. Allergies and medications reviewed and updated.  Review of Systems  Constitutional:  Negative for activity change, diaphoresis, fatigue and fever.  Respiratory:  Negative for cough, chest tightness, shortness of breath and wheezing.   Cardiovascular:  Negative for chest pain, palpitations and leg swelling.  Gastrointestinal: Negative.   Endocrine: Negative for cold intolerance, heat intolerance, polydipsia, polyphagia and polyuria.   Neurological: Negative.   Psychiatric/Behavioral: Negative.     Per HPI unless specifically indicated above     Objective:    BP 103/68   Pulse 80   Temp 98.3 F (36.8 C) (Oral)   Wt 207 lb 12.8 oz (94.3 kg)   SpO2 97%   BMI 30.43 kg/m   Wt Readings from Last 3 Encounters:  02/12/21 207 lb 12.8 oz (94.3 kg)  11/12/20 206 lb 12.8 oz (93.8 kg)  08/11/20 211 lb (95.7 kg)    Physical Exam Vitals and nursing note reviewed.  Constitutional:      General: He is awake. He is not in acute distress.    Appearance: He is well-developed, well-groomed and overweight. He is not ill-appearing.  HENT:     Head: Normocephalic and atraumatic.     Right Ear: Hearing normal. No drainage.     Left Ear: Hearing normal. No drainage.  Eyes:     General: Lids are normal.        Right eye: No discharge.        Left eye: No discharge.     Conjunctiva/sclera: Conjunctivae normal.     Pupils: Pupils are equal, round, and reactive to light.  Neck:     Trachea: Trachea normal.  Cardiovascular:     Rate and Rhythm: Normal rate and regular rhythm.     Heart sounds: Normal heart sounds, S1 normal and S2 normal. No murmur heard.   No gallop.  Pulmonary:     Effort: Pulmonary effort is normal. No accessory muscle usage or respiratory distress.     Breath sounds: Normal breath sounds.  Abdominal:     General: Bowel sounds are normal.     Palpations: Abdomen is soft.  Musculoskeletal:        General: Normal range of motion.     Cervical back: Normal range of motion and neck supple.     Right lower leg: No edema.     Left lower leg: No edema.  Skin:    General: Skin is warm and dry.     Comments: Scattered small pale bruises to bilateral upper extremities.    Neurological:     Mental Status: He is alert and oriented to person, place, and time.  Psychiatric:        Attention and Perception: Attention normal.        Mood and Affect: Mood normal.        Speech: Speech normal.        Behavior:  Behavior normal. Behavior is cooperative.        Thought Content: Thought content normal.    Results for orders placed or performed in visit on 02/03/21  HM DIABETES EYE EXAM  Result Value Ref Range   HM Diabetic Eye Exam No Retinopathy No Retinopathy      Assessment & Plan:   Problem List Items Addressed This Visit       Cardiovascular and Mediastinum   Hypertension associated with diabetes (HCC)  Chronic, ongoing. BP at goal in office and at home -- recommend he alert if consistently low and symptoms present.  Recommend he monitor BP at least a few mornings a week at home and document.  DASH diet at home.  Continue current medication regimen and adjust as needed.  Labs today: A1c.  Return in 3 months.      Relevant Orders   Bayer DCA Hb A1c Waived   Paroxysmal A-fib (HCC)    Chronic, sees cardiology.  Continue current medication regimen as prescribed by cardiology at this time and adjust as needed.  Recent cardiology note reviewed.  Continue collaboration.        Senile purpura (HCC)    Noted on exam, on anticoagulant therapy for a-fib.  Recommend gentle skin care at home and monitor for wounds, if present then immediately notify provider.        Endocrine   Hypothyroidism    Chronic, ongoing.  Continue current medication regimen and adjust as needed based on labs.  Thyroid labs stable recent check.      Hyperlipidemia associated with type 2 diabetes mellitus (HCC)    Chronic, ongoing.  Continue with current regimen and adjust as needed based on labs.  Lipid panel up to date.      Relevant Orders   Bayer DCA Hb A1c Waived   Type 2 diabetes mellitus with chronic kidney disease, without long-term current use of insulin (HCC) - Primary    Chronic, ongoing.  A1C 7% today, mild upward trend.  Continue Trulicity 4.5 MG weekly + continue Metformin ER 500 MG BID.   Renal function limits medications that can be used + cost -- donut hole issues with Medicare.  Recommend he check  blood sugar twice a day, in morning fasting and 2 hours after a meal, document these.  Collaborate with CCM team.  Return in 3 months.      Relevant Orders   Bayer DCA Hb A1c Waived     Genitourinary   CKD (chronic kidney disease), stage III (HCC)    Chronic, ongoing.  Continue to collaborate with nephrology as needed and renal dose medication, Lisinopril for kidney protection.  CMP up to date, recheck next visit.        Other   Obesity    BMI 30.43.  Recommended eating smaller high protein, low fat meals more frequently and exercising 30 mins a day 5 times a week with a goal of 10-15lb weight loss in the next 3 months. Patient voiced their understanding and motivation to adhere to these recommendations.       Other Visit Diagnoses     Flu vaccine need       Flu vaccine today   Relevant Orders   Flu Vaccine QUAD High Dose(Fluad) (Completed)   Colon cancer screening       Cologuard ordered   Relevant Orders   Cologuard        Follow up plan: Return in about 3 months (around 05/14/2021) for T2DM, HTN/HLD, THYROID, CKD.

## 2021-02-12 NOTE — Assessment & Plan Note (Signed)
Chronic, ongoing.  A1C 7% today, mild upward trend.  Continue Trulicity 4.5 MG weekly + continue Metformin ER 500 MG BID.   Renal function limits medications that can be used + cost -- donut hole issues with Medicare.  Recommend he check blood sugar twice a day, in morning fasting and 2 hours after a meal, document these.  Collaborate with CCM team.  Return in 3 months.

## 2021-02-12 NOTE — Assessment & Plan Note (Signed)
BMI 30.43.  Recommended eating smaller high protein, low fat meals more frequently and exercising 30 mins a day 5 times a week with a goal of 10-15lb weight loss in the next 3 months. Patient voiced their understanding and motivation to adhere to these recommendations. ° °

## 2021-02-12 NOTE — Assessment & Plan Note (Signed)
Chronic, ongoing.  Continue current medication regimen and adjust as needed based on labs.  Thyroid labs stable recent check. °

## 2021-02-12 NOTE — Assessment & Plan Note (Signed)
Chronic, ongoing.  Continue with current regimen and adjust as needed based on labs.  Lipid panel up to date. 

## 2021-02-12 NOTE — Assessment & Plan Note (Signed)
Chronic, ongoing. BP at goal in office and at home -- recommend he alert if consistently low and symptoms present.  Recommend he monitor BP at least a few mornings a week at home and document.  DASH diet at home.  Continue current medication regimen and adjust as needed.  Labs today: A1c.  Return in 3 months.

## 2021-02-15 ENCOUNTER — Other Ambulatory Visit: Payer: Self-pay | Admitting: Nurse Practitioner

## 2021-02-16 NOTE — Telephone Encounter (Signed)
Requested medication (s) are due for refill today:  Yes  Requested medication (s) are on the active medication list:   Yes  Future visit scheduled:   Yes   Last ordered: 02/04/2020  6 ml, 4 refills  Returned because the amount is not specified.  Pharmacy is requesting 2 each however the former Rx was ordered in ml.   Provider to review for how it's to be prescribed.   Requested Prescriptions  Pending Prescriptions Disp Refills   TRULICITY 4.5 MG/0.5ML SOPN [Pharmacy Med Name: TRULICITY 4.5 MG/0.5 ML PEN]  14    Sig: INJECT 4.5 MG AS DIRECTED ONCE A WEEK.     Endocrinology:  Diabetes - GLP-1 Receptor Agonists Passed - 02/15/2021  9:33 AM      Passed - HBA1C is between 0 and 7.9 and within 180 days    HB A1C (BAYER DCA - WAIVED)  Date Value Ref Range Status  02/12/2021 7.0 (H) 4.8 - 5.6 % Final    Comment:             Prediabetes: 5.7 - 6.4          Diabetes: >6.4          Glycemic control for adults with diabetes: <7.0               **Please note reference interval change**           Passed - Valid encounter within last 6 months    Recent Outpatient Visits           4 days ago Type 2 diabetes mellitus with stage 3b chronic kidney disease, without long-term current use of insulin (HCC)   [facility] [place], Jolene T, NP   3 months ago Type 2 diabetes mellitus with stage 3b chronic kidney disease, without long-term current use of insulin (HCC)   [facility] [place], Jolene T, NP   6 months ago Type 2 diabetes mellitus with stage 3b chronic kidney disease, without long-term current use of insulin (HCC)   [facility] [place], Avanti, MD   9 months ago Type 2 diabetes mellitus with stage 3b chronic kidney disease, without long-term current use of insulin (HCC)   [facility] Cannady, Jolene T, NP   11 months ago Type 2 diabetes mellitus with stage 3b chronic kidney disease, without long-term current use of insulin (HCC)   [facility] [place], Dorie Rank, NP       Future Appointments             In 3 months Cannady, Dorie Rank, NP [facility]   In 3 months  [facility]

## 2021-02-18 DIAGNOSIS — Z1211 Encounter for screening for malignant neoplasm of colon: Secondary | ICD-10-CM | POA: Diagnosis not present

## 2021-02-19 DIAGNOSIS — I480 Paroxysmal atrial fibrillation: Secondary | ICD-10-CM | POA: Diagnosis not present

## 2021-02-19 DIAGNOSIS — I10 Essential (primary) hypertension: Secondary | ICD-10-CM | POA: Diagnosis not present

## 2021-02-19 DIAGNOSIS — I6523 Occlusion and stenosis of bilateral carotid arteries: Secondary | ICD-10-CM | POA: Diagnosis not present

## 2021-02-19 DIAGNOSIS — E782 Mixed hyperlipidemia: Secondary | ICD-10-CM | POA: Diagnosis not present

## 2021-02-24 LAB — COLOGUARD: Cologuard: NEGATIVE

## 2021-02-24 NOTE — Progress Notes (Signed)
Contacted via MyChart   Negative Cologuard!!  Woohoo!!  No more screening needed at this time, recommendation for screening is up to age 75.:)

## 2021-04-03 ENCOUNTER — Other Ambulatory Visit: Payer: Self-pay | Admitting: Nurse Practitioner

## 2021-04-04 NOTE — Telephone Encounter (Signed)
Requested Prescriptions  Pending Prescriptions Disp Refills  . metFORMIN (GLUCOPHAGE-XR) 500 MG 24 hr tablet [Pharmacy Med Name: METFORMIN HCL ER 500 MG TABLET] 180 tablet 0    Sig: TAKE 1 TABLET BY MOUTH TWICE A DAY     Endocrinology:  Diabetes - Biguanides Failed - 04/03/2021  2:41 PM      Failed - Cr in normal range and within 360 days    Creatinine  Date Value Ref Range Status  11/15/2011 1.41 (H) 0.60 - 1.30 mg/dL Final   Creatinine, Ser  Date Value Ref Range Status  11/12/2020 1.60 (H) 0.76 - 1.27 mg/dL Final         Failed - eGFR in normal range and within 360 days    EGFR (African American)  Date Value Ref Range Status  11/15/2011 60 (L)  Final   GFR calc Af Amer  Date Value Ref Range Status  05/12/2020 55 (L) >59 mL/min/1.73 Final    Comment:    **In accordance with recommendations from the NKF-ASN Task force,**   [facility] is in the process of updating its eGFR calculation to the   2021 CKD-EPI creatinine equation that estimates kidney function   without a race variable.    EGFR (Non-African Amer.)  Date Value Ref Range Status  11/15/2011 52 (L)  Final    Comment:    eGFR values <14m/min/1.73 m2 may be an indication of chronic kidney disease (CKD). Calculated eGFR is useful in patients with stable renal function. The eGFR calculation will not be reliable in acutely ill patients when serum creatinine is changing rapidly. It is not useful in  patients on dialysis. The eGFR calculation may not be applicable to patients at the low and high extremes of body sizes, pregnant women, and vegetarians.    GFR calc non Af Amer  Date Value Ref Range Status  05/12/2020 47 (L) >59 mL/min/1.73 Final   eGFR  Date Value Ref Range Status  11/12/2020 45 (L) >59 mL/min/1.73 Final         Passed - HBA1C is between 0 and 7.9 and within 180 days    HB A1C (BAYER DCA - WAIVED)  Date Value Ref Range Status  02/12/2021 7.0 (H) 4.8 - 5.6 % Final    Comment:              Prediabetes: 5.7 - 6.4          Diabetes: >6.4          Glycemic control for adults with diabetes: <7.0               **Please note reference interval change**          Passed - Valid encounter within last 6 months    Recent Outpatient Visits          1 month ago Type 2 diabetes mellitus with stage 3b chronic kidney disease, without long-term current use of insulin ([place]   4 months ago Type 2 diabetes mellitus with stage 3b chronic kidney disease, without long-term current use of insulin ([place]   7 months ago Type 2 diabetes mellitus with stage 3b chronic kidney disease, without long-term current use of insulin ([place]   [facility] [place], Avanti, MD   10 months ago Type 2 diabetes mellitus with stage 3b chronic kidney disease, without long-term current use of insulin ([place]   [facility] [place], Gooding T, NP   1 year ago Type 2 diabetes mellitus with stage 3b chronic kidney disease, without long-term current use of insulin ([place])   [place], Barbaraann Faster, NP      Future Appointments            In 1 month Cannady, Barbaraann Faster, NP [facility]   In 1 month  [facility]

## 2021-05-10 ENCOUNTER — Other Ambulatory Visit: Payer: Self-pay | Admitting: Nurse Practitioner

## 2021-05-10 ENCOUNTER — Encounter: Payer: Self-pay | Admitting: Nurse Practitioner

## 2021-05-11 NOTE — Telephone Encounter (Signed)
Requested Prescriptions  Pending Prescriptions Disp Refills   metFORMIN (GLUCOPHAGE-XR) 500 MG 24 hr tablet [Pharmacy Med Name: METFORMIN HCL ER 500 MG TABLET] 180 tablet 0    Sig: TAKE 1 TABLET BY MOUTH TWICE A DAY     Endocrinology:  Diabetes - Biguanides Failed - 05/10/2021  5:00 PM      Failed - Cr in normal range and within 360 days    Creatinine  Date Value Ref Range Status  11/15/2011 1.41 (H) 0.60 - 1.30 mg/dL Final   Creatinine, Ser  Date Value Ref Range Status  11/12/2020 1.60 (H) 0.76 - 1.27 mg/dL Final         Failed - eGFR in normal range and within 360 days    EGFR (African American)  Date Value Ref Range Status  11/15/2011 60 (L)  Final   GFR calc Af Amer  Date Value Ref Range Status  05/12/2020 55 (L) >59 mL/min/1.73 Final    Comment:    **In accordance with recommendations from the NKF-ASN Task force,**   [facility] is in the process of updating its eGFR calculation to the   2021 CKD-EPI creatinine equation that estimates kidney function   without a race variable.    EGFR (Non-African Amer.)  Date Value Ref Range Status  11/15/2011 52 (L)  Final    Comment:    eGFR values <10m/min/1.73 m2 may be an indication of chronic kidney disease (CKD). Calculated eGFR is useful in patients with stable renal function. The eGFR calculation will not be reliable in acutely ill patients when serum creatinine is changing rapidly. It is not useful in  patients on dialysis. The eGFR calculation may not be applicable to patients at the low and high extremes of body sizes, pregnant women, and vegetarians.    GFR calc non Af Amer  Date Value Ref Range Status  05/12/2020 47 (L) >59 mL/min/1.73 Final   eGFR  Date Value Ref Range Status  11/12/2020 45 (L) >59 mL/min/1.73 Final         Passed - HBA1C is between 0 and 7.9 and within 180 days    HB A1C (BAYER DCA - WAIVED)  Date Value Ref Range Status  02/12/2021 7.0 (H) 4.8 - 5.6 % Final    Comment:              Prediabetes: 5.7 - 6.4          Diabetes: >6.4          Glycemic control for adults with diabetes: <7.0               **Please note reference interval change**          Passed - Valid encounter within last 6 months    Recent Outpatient Visits          2 months ago Type 2 diabetes mellitus with stage 3b chronic kidney disease, without long-term current use of insulin ([facility]   [facility]   6 months ago Type 2 diabetes mellitus with stage 3b chronic kidney disease, without long-term current use of insulin ([place]   [facility] [place]   9 months ago Type 2 diabetes mellitus with stage 3b chronic kidney disease, without long-term current use of insulin ([facility]   [facility] V[place], Avanti, MD   12 months ago Type 2 diabetes mellitus with stage 3b chronic kidney disease, without long-term current use of insulin ([facility]   [facility] [place], Elkhorn T, NP   1 year ago Type 2 diabetes mellitus with stage 3b chronic kidney disease, without long-term current use of insulin ([place])   [place], Barbaraann Faster, NP      Future Appointments            In 1 week Cannady, Barbaraann Faster, NP [facility]   In 3 weeks  [facility]

## 2021-05-16 NOTE — Patient Instructions (Incomplete)

## 2021-05-19 ENCOUNTER — Ambulatory Visit: Payer: Medicare Other | Admitting: Nurse Practitioner

## 2021-05-22 ENCOUNTER — Ambulatory Visit (INDEPENDENT_AMBULATORY_CARE_PROVIDER_SITE_OTHER): Payer: Medicare Other | Admitting: Nurse Practitioner

## 2021-05-22 ENCOUNTER — Encounter: Payer: Self-pay | Admitting: Nurse Practitioner

## 2021-05-22 ENCOUNTER — Other Ambulatory Visit: Payer: Self-pay

## 2021-05-22 VITALS — BP 104/65 | HR 86 | Temp 97.9°F | Wt 200.2 lb

## 2021-05-22 DIAGNOSIS — I152 Hypertension secondary to endocrine disorders: Secondary | ICD-10-CM

## 2021-05-22 DIAGNOSIS — N1832 Chronic kidney disease, stage 3b: Secondary | ICD-10-CM | POA: Diagnosis not present

## 2021-05-22 DIAGNOSIS — E1159 Type 2 diabetes mellitus with other circulatory complications: Secondary | ICD-10-CM

## 2021-05-22 DIAGNOSIS — Z6830 Body mass index (BMI) 30.0-30.9, adult: Secondary | ICD-10-CM

## 2021-05-22 DIAGNOSIS — E559 Vitamin D deficiency, unspecified: Secondary | ICD-10-CM | POA: Diagnosis not present

## 2021-05-22 DIAGNOSIS — E039 Hypothyroidism, unspecified: Secondary | ICD-10-CM

## 2021-05-22 DIAGNOSIS — D696 Thrombocytopenia, unspecified: Secondary | ICD-10-CM

## 2021-05-22 DIAGNOSIS — D692 Other nonthrombocytopenic purpura: Secondary | ICD-10-CM | POA: Diagnosis not present

## 2021-05-22 DIAGNOSIS — E1122 Type 2 diabetes mellitus with diabetic chronic kidney disease: Secondary | ICD-10-CM | POA: Diagnosis not present

## 2021-05-22 DIAGNOSIS — E6609 Other obesity due to excess calories: Secondary | ICD-10-CM

## 2021-05-22 DIAGNOSIS — I480 Paroxysmal atrial fibrillation: Secondary | ICD-10-CM | POA: Diagnosis not present

## 2021-05-22 DIAGNOSIS — E1169 Type 2 diabetes mellitus with other specified complication: Secondary | ICD-10-CM

## 2021-05-22 DIAGNOSIS — E785 Hyperlipidemia, unspecified: Secondary | ICD-10-CM | POA: Diagnosis not present

## 2021-05-22 LAB — BAYER DCA HB A1C WAIVED: HB A1C (BAYER DCA - WAIVED): 7.6 % — ABNORMAL HIGH (ref 4.8–5.6)

## 2021-05-22 MED ORDER — LEVOTHYROXINE SODIUM 125 MCG PO TABS: 125.0000 ug | ORAL_TABLET | Freq: Every day | ORAL | 4 refills | Status: DC

## 2021-05-22 MED ORDER — ATORVASTATIN CALCIUM 80 MG PO TABS: 80.0000 mg | ORAL_TABLET | Freq: Every day | ORAL | 4 refills | Status: DC

## 2021-05-22 MED ORDER — LISINOPRIL 20 MG PO TABS: 20.0000 mg | ORAL_TABLET | Freq: Every day | ORAL | 4 refills | Status: DC

## 2021-05-22 MED ORDER — METFORMIN HCL ER 500 MG PO TB24: 500.0000 mg | ORAL_TABLET | Freq: Two times a day (BID) | ORAL | 4 refills | Status: DC

## 2021-05-22 NOTE — Assessment & Plan Note (Signed)
Chronic, ongoing. BP at goal in office and at home -- recommend he alert if consistent lows and symptoms present.  Recommend he monitor BP at least a few mornings a week at home and document.  DASH diet at home.  Continue current medication regimen and adjust as needed.  Labs today: BMP and CBC.  Return in 3 months.

## 2021-05-22 NOTE — Patient Instructions (Signed)
If you are ever sick you can take Claritin (plain not the D) and/or Coricidin as these will not affect heart or blood pressure.  DASH Eating Plan DASH stands for Dietary Approaches to Stop Hypertension. The DASH eating plan is a healthy eating plan that has been shown to: Reduce high blood pressure (hypertension). Reduce your risk for type 2 diabetes, heart disease, and stroke. Help with weight loss. What are tips for following this plan? Reading food labels Check food labels for the amount of salt (sodium) per serving. Choose foods with less than 5 percent of the Daily Value of sodium. Generally, foods with less than 300 milligrams (mg) of sodium per serving fit into this eating plan. To find whole grains, look for the word "whole" as the first word in the ingredient list. Shopping Buy products labeled as "low-sodium" or "no salt added." Buy fresh foods. Avoid canned foods and pre-made or frozen meals. Cooking Avoid adding salt when cooking. Use salt-free seasonings or herbs instead of table salt or sea salt. Check with your health care provider or pharmacist before using salt substitutes. Do not fry foods. Cook foods using healthy methods such as baking, boiling, grilling, roasting, and broiling instead. Cook with heart-healthy oils, such as olive, canola, avocado, soybean, or sunflower oil. Meal planning  Eat a balanced diet that includes: 4 or more servings of fruits and 4 or more servings of vegetables each day. Try to fill one-half of your plate with fruits and vegetables. 6-8 servings of whole grains each day. Less than 6 oz (170 g) of lean meat, poultry, or fish each day. A 3-oz (85-g) serving of meat is about the same size as a deck of cards. One egg equals 1 oz (28 g). 2-3 servings of low-fat dairy each day. One serving is 1 cup (237 mL). 1 serving of nuts, seeds, or beans 5 times each week. 2-3 servings of heart-healthy fats. Healthy fats called omega-3 fatty acids are found in  foods such as walnuts, flaxseeds, fortified milks, and eggs. These fats are also found in cold-water fish, such as sardines, salmon, and mackerel. Limit how much you eat of: Canned or prepackaged foods. Food that is high in trans fat, such as some fried foods. Food that is high in saturated fat, such as fatty meat. Desserts and other sweets, sugary drinks, and other foods with added sugar. Full-fat dairy products. Do not salt foods before eating. Do not eat more than 4 egg yolks a week. Try to eat at least 2 vegetarian meals a week. Eat more home-cooked food and less restaurant, buffet, and fast food. Lifestyle When eating at a restaurant, ask that your food be prepared with less salt or no salt, if possible. If you drink alcohol: Limit how much you use to: 0-1 drink a day for women who are not pregnant. 0-2 drinks a day for men. Be aware of how much alcohol is in your drink. In the [place] one drink equals one 12 oz bottle of beer (355 mL), one 5 oz glass of wine (148 mL), or one 1 oz glass of hard liquor (44 mL). General information Avoid eating more than 2,300 mg of salt a day. If you have hypertension, you may need to reduce your sodium intake to 1,500 mg a day. Work with your health care provider to maintain a healthy body weight or to lose weight. Ask what an ideal weight is for you. Get at least 30 minutes of exercise that causes your heart to  beat faster (aerobic exercise) most days of the week. Activities may include walking, swimming, or biking. Work with your health care provider or dietitian to adjust your eating plan to your individual calorie needs. What foods should I eat? Fruits All fresh, dried, or frozen fruit. Canned fruit in natural juice (without added sugar). Vegetables Fresh or frozen vegetables (raw, steamed, roasted, or grilled). Low-sodium or reduced-sodium tomato and vegetable juice. Low-sodium or reduced-sodium tomato sauce and tomato paste. Low-sodium or  reduced-sodium canned vegetables. Grains Whole-grain or whole-wheat bread. Whole-grain or whole-wheat pasta. Brown rice. Orpah Cobb. Bulgur. Whole-grain and low-sodium cereals. Pita bread. Low-fat, low-sodium crackers. Whole-wheat flour tortillas. Meats and other proteins Skinless chicken or [place]. Ground chicken or [place]. Pork with fat trimmed off. Fish and seafood. Egg whites. Dried beans, peas, or lentils. Unsalted nuts, nut butters, and seeds. Unsalted canned beans. Lean cuts of beef with fat trimmed off. Low-sodium, lean precooked or cured meat, such as sausages or meat loaves. Dairy Low-fat (1%) or fat-free (skim) milk. Reduced-fat, low-fat, or fat-free cheeses. Nonfat, low-sodium ricotta or cottage cheese. Low-fat or nonfat yogurt. Low-fat, low-sodium cheese. Fats and oils Soft margarine without trans fats. Vegetable oil. Reduced-fat, low-fat, or light mayonnaise and salad dressings (reduced-sodium). Canola, safflower, olive, avocado, soybean, and sunflower oils. Avocado. Seasonings and condiments Herbs. Spices. Seasoning mixes without salt. Other foods Unsalted popcorn and pretzels. Fat-free sweets. The items listed above may not be a complete list of foods and beverages you can eat. Contact a dietitian for more information. What foods should I avoid? Fruits Canned fruit in a light or heavy syrup. Fried fruit. Fruit in cream or butter sauce. Vegetables Creamed or fried vegetables. Vegetables in a cheese sauce. Regular canned vegetables (not low-sodium or reduced-sodium). Regular canned tomato sauce and paste (not low-sodium or reduced-sodium). Regular tomato and vegetable juice (not low-sodium or reduced-sodium). Rosita Fire. Olives. Grains Baked goods made with fat, such as croissants, muffins, or some breads. Dry pasta or rice meal packs. Meats and other proteins Fatty cuts of meat. Ribs. Fried meat. Tomasa Blase. Bologna, salami, and other precooked or cured meats, such as sausages or  meat loaves. Fat from the back of a pig (fatback). Bratwurst. Salted nuts and seeds. Canned beans with added salt. Canned or smoked fish. Whole eggs or egg yolks. Chicken or [place] with skin. Dairy Whole or 2% milk, cream, and half-and-half. Whole or full-fat cream cheese. Whole-fat or sweetened yogurt. Full-fat cheese. Nondairy creamers. Whipped toppings. Processed cheese and cheese spreads. Fats and oils Butter. Stick margarine. Lard. Shortening. Ghee. Bacon fat. Tropical oils, such as coconut, palm kernel, or palm oil. Seasonings and condiments Onion salt, garlic salt, seasoned salt, table salt, and sea salt. Worcestershire sauce. Tartar sauce. Barbecue sauce. Teriyaki sauce. Soy sauce, including reduced-sodium. Steak sauce. Canned and packaged gravies. Fish sauce. Oyster sauce. Cocktail sauce. Store-bought horseradish. Ketchup. Mustard. Meat flavorings and tenderizers. Bouillon cubes. Hot sauces. Pre-made or packaged marinades. Pre-made or packaged taco seasonings. Relishes. Regular salad dressings. Other foods Salted popcorn and pretzels. The items listed above may not be a complete list of foods and beverages you should avoid. Contact a dietitian for more information. Where to find more information National Heart, Lung, and Blood Institute: PopSteam.is American Heart Association: www.heart.org Academy of Nutrition and Dietetics: www.eatright.org National Kidney Foundation: www.kidney.org Summary The DASH eating plan is a healthy eating plan that has been shown to reduce high blood pressure (hypertension). It may also reduce your risk for type 2 diabetes, heart disease, and stroke. When  on the DASH eating plan, aim to eat more fresh fruits and vegetables, whole grains, lean proteins, low-fat dairy, and heart-healthy fats. With the DASH eating plan, you should limit salt (sodium) intake to 2,300 mg a day. If you have hypertension, you may need to reduce your sodium intake to 1,500 mg a  day. Work with your health care provider or dietitian to adjust your eating plan to your individual calorie needs. This information is not intended to replace advice given to you by your health care provider. Make sure you discuss any questions you have with your health care provider. Document Revised: 04/13/2019 Document Reviewed: 04/13/2019 Elsevier Patient Education  2022 [employer].

## 2021-05-22 NOTE — Progress Notes (Signed)
BP 104/65    Pulse 86    Temp 97.9 F (36.6 C) (Oral)    Wt 200 lb 3.2 oz (90.8 kg)    SpO2 97%    BMI 29.32 kg/m    Subjective:    Patient ID: Jonathan [name], male    DOB: 11/01/1945, 75 y.o.   MRN: [id number]  HPI: Jonathan [name] is a 75 y.o. male  Chief Complaint  Patient presents with   Hypertension   Diabetes   Hyperlipidemia   Chronic Kidney Disease   DIABETES A1c 7% in September. Currently taking Trulicity 4.5 MG weekly and Metformin XR 500 MG BID.  Tried Invokana, but was discontinued in past due to kidney function. Hypoglycemic episodes:no Polydipsia/polyuria: no Visual disturbance: no Chest pain: no Paresthesias: no Glucose Monitoring: yes             Accucheck frequency: occasionally             Fasting glucose:              Post prandial:              Evening:              Before meals: Taking Insulin?: no             Long acting insulin:             Short acting insulin: Blood Pressure Monitoring: daily Retinal Examination: Up to date  Foot Exam: Up to Date Pneumovax: Up to Date Influenza: Up to Date Aspirin: no   HYPERTENSION / HYPERLIPIDEMIA Continues on Amlodipine, Lisinopril, Metoprolol, Atorvastatin, Apixaban. Satisfied with current treatment? yes Duration of hypertension: chronic BP monitoring frequency: occasional BP range: 100-110/60-70 at home BP medication side effects: no Duration of hyperlipidemia: chronic Cholesterol medication side effects: no Cholesterol supplements: none Medication compliance: good compliance Aspirin: no Recent stressors: no Recurrent headaches: no Visual changes: no Palpitations: no Dyspnea: no Chest pain: no Lower extremity edema: no Dizzy/lightheaded: no    CHRONIC KIDNEY DISEASE CKD status: stable Medications renally dose: yes Previous renal evaluation: yes Pneumovax:  Up to Date Influenza Vaccine:  Up to Date   ATRIAL FIBRILLATION Saw Dr. Gwen Pounds last 02/19/21 -- continues Metoprolol 25 MG BID,  Eliquis, Flecainide 100 MG BID, Lisinopril. Continues on Atorvastatin for HLD.     Had seen Dr. Maisie Fus from EP in 2021 about left atrial appendage closure device, but has opted not to do this at this time. Last EF was > 55% on 02/09/21. Atrial fibrillation status: controlled Satisfied with current treatment: stable Medication side effects:  no Medication compliance: good compliance Etiology of atrial fibrillation:  Palpitations:  no Chest pain:  no Dyspnea on exertion:  no Orthopnea:  no Syncope:  no Edema:  no Ventricular rate control: B-blocker Anti-coagulation: long acting   HYPOTHYROIDISM Continues Levothyroxine 125 MCG daily.   Thyroid control status:controlled Satisfied with current treatment? no Medication side effects: no Medication compliance: good compliance Etiology of hypothyroidism:  Recent dose adjustment:no Fatigue: no Cold intolerance: no Heat intolerance: no Weight gain: no Weight loss: no Constipation: no Diarrhea/loose stools: no Palpitations: no Lower extremity edema: no Anxiety/depressed mood: no   Relevant past medical, surgical, family and social history reviewed and updated as indicated. Interim medical history since our last visit reviewed. Allergies and medications reviewed and updated.  Review of Systems  Constitutional:  Negative for activity change, diaphoresis, fatigue and fever.  Respiratory:  Negative for cough, chest tightness, shortness  of breath and wheezing.   Cardiovascular:  Negative for chest pain, palpitations and leg swelling.  Gastrointestinal: Negative.   Endocrine: Negative for cold intolerance, heat intolerance, polydipsia, polyphagia and polyuria.  Neurological: Negative.   Psychiatric/Behavioral: Negative.     Per HPI unless specifically indicated above     Objective:    BP 104/65    Pulse 86    Temp 97.9 F (36.6 C) (Oral)    Wt 200 lb 3.2 oz (90.8 kg)    SpO2 97%    BMI 29.32 kg/m   Wt Readings from Last 3  Encounters:  05/22/21 200 lb 3.2 oz (90.8 kg)  02/12/21 207 lb 12.8 oz (94.3 kg)  11/12/20 206 lb 12.8 oz (93.8 kg)    Physical Exam Vitals and nursing note reviewed.  Constitutional:      General: He is awake. He is not in acute distress.    Appearance: He is well-developed, well-groomed and overweight. He is not ill-appearing.  HENT:     Head: Normocephalic and atraumatic.     Right Ear: Hearing normal. No drainage.     Left Ear: Hearing normal. No drainage.  Eyes:     General: Lids are normal.        Right eye: No discharge.        Left eye: No discharge.     Conjunctiva/sclera: Conjunctivae normal.     Pupils: Pupils are equal, round, and reactive to light.  Neck:     Trachea: Trachea normal.  Cardiovascular:     Rate and Rhythm: Normal rate and regular rhythm.     Heart sounds: Normal heart sounds, S1 normal and S2 normal. No murmur heard.   No gallop.  Pulmonary:     Effort: Pulmonary effort is normal. No accessory muscle usage or respiratory distress.     Breath sounds: Normal breath sounds.  Abdominal:     General: Bowel sounds are normal.     Palpations: Abdomen is soft. There is no hepatomegaly or splenomegaly.  Musculoskeletal:        General: Normal range of motion.     Cervical back: Normal range of motion and neck supple.     Right lower leg: No edema.     Left lower leg: No edema.  Skin:    General: Skin is warm and dry.  Neurological:     Mental Status: He is alert and oriented to person, place, and time.  Psychiatric:        Attention and Perception: Attention normal.        Mood and Affect: Mood normal.        Speech: Speech normal.        Behavior: Behavior normal. Behavior is cooperative.        Thought Content: Thought content normal.   Results for orders placed or performed in visit on 02/12/21  Bayer DCA Hb A1c Waived  Result Value Ref Range   HB A1C (BAYER DCA - WAIVED) 7.0 (H) 4.8 - 5.6 %  Cologuard  Result Value Ref Range   Cologuard  Negative Negative      Assessment & Plan:   Problem List Items Addressed This Visit       Cardiovascular and Mediastinum   Hypertension associated with diabetes ([place])    Chronic, ongoing. BP at goal in office and at home -- recommend he alert if consistent lows and symptoms present.  Recommend he monitor BP at least a few mornings a week at home  and document.  DASH diet at home.  Continue current medication regimen and adjust as needed.  Labs today: BMP and CBC.  Return in 3 months.      Relevant Medications   metFORMIN (GLUCOPHAGE-XR) 500 MG 24 hr tablet   lisinopril (ZESTRIL) 20 MG tablet   atorvastatin (LIPITOR) 80 MG tablet   Other Relevant Orders   Bayer DCA Hb A1c Waived   Basic metabolic panel   Paroxysmal A-fib (HCC)    Chronic, sees cardiology.  Continue current medication regimen as prescribed by cardiology at this time and adjust as needed.  Recent cardiology note reviewed.  Continue collaboration.        Relevant Medications   lisinopril (ZESTRIL) 20 MG tablet   atorvastatin (LIPITOR) 80 MG tablet   Senile purpura ([place])    Noted on exam, on anticoagulant therapy for a-fib.  Recommend gentle skin care at home and monitor for wounds, if present then immediately notify provider.      Relevant Medications   lisinopril (ZESTRIL) 20 MG tablet   atorvastatin (LIPITOR) 80 MG tablet     Endocrine   Hyperlipidemia associated with type 2 diabetes mellitus (HCC)    Chronic, ongoing.  Continue with current regimen and adjust as needed based on labs.  Lipid panel today.      Relevant Medications   metFORMIN (GLUCOPHAGE-XR) 500 MG 24 hr tablet   lisinopril (ZESTRIL) 20 MG tablet   atorvastatin (LIPITOR) 80 MG tablet   Other Relevant Orders   Bayer DCA Hb A1c Waived   Lipid Panel w/o Chol/HDL Ratio   Hypothyroidism    Chronic, ongoing.  Continue current medication regimen and adjust as needed based on labs.  Thyroid labs stable recent check.      Relevant Medications    levothyroxine (SYNTHROID) 125 MCG tablet   Type 2 diabetes mellitus with chronic kidney disease, without long-term current use of insulin (HCC) - Primary    Chronic, ongoing.  A1C 7.6% today, mild upward trend due to dietary indiscretions over holidays.  Continue Trulicity 4.5 MG weekly + continue Metformin ER 500 MG BID.   Renal function limits medications that can be used + cost -- donut hole issues with Medicare.  Recommend he check blood sugar twice a day, in morning fasting and 2 hours after a meal, document these.  Collaborate with CCM team.  May need to adjust regimen next visit, could consider a very low dose sulfonyurea if needed to keep cost low and avoid insulin.  Return in 3 months.      Relevant Medications   metFORMIN (GLUCOPHAGE-XR) 500 MG 24 hr tablet   lisinopril (ZESTRIL) 20 MG tablet   atorvastatin (LIPITOR) 80 MG tablet   Other Relevant Orders   Bayer DCA Hb A1c Waived     Genitourinary   CKD (chronic kidney disease), stage III (HCC)    Chronic, ongoing.  Continue to collaborate with nephrology as needed and renal dose medication, Lisinopril for kidney protection.  BMP and CBC today.      Relevant Orders   Bayer DCA Hb A1c Waived   Basic metabolic panel     Hematopoietic and Hemostatic   Thrombocytopenia (HCC)    Recheck CBC every 6 months (ordered today), no current symptoms.  Previously followed by Dr. Tish Men.  CBC today.      Relevant Orders   CBC with Differential/Platelet     Other   Obesity    BMI 29.32, has lost 7 pounds.  Recommended eating  smaller high protein, low fat meals more frequently and exercising 30 mins a day 5 times a week with a goal of 10-15lb weight loss in the next 3 months. Patient voiced their understanding and motivation to adhere to these recommendations.       Relevant Medications   metFORMIN (GLUCOPHAGE-XR) 500 MG 24 hr tablet   Other Visit Diagnoses     Vitamin D deficiency       History of low levels, check today and  start supplement as needed.   Relevant Orders   VITAMIN D 25 Hydroxy (Vit-D Deficiency, Fractures)        Follow up plan: Return in about 3 months (around 08/20/2021) for T2DM, HTN/HLD, A-FIB, CKD, THYROID.

## 2021-05-22 NOTE — Assessment & Plan Note (Signed)
Chronic, ongoing.  A1C 7.6% today, mild upward trend due to dietary indiscretions over holidays.  Continue Trulicity 4.5 MG weekly + continue Metformin ER 500 MG BID.   Renal function limits medications that can be used + cost -- donut hole issues with Medicare.  Recommend he check blood sugar twice a day, in morning fasting and 2 hours after a meal, document these.  Collaborate with CCM team.  May need to adjust regimen next visit, could consider a very low dose sulfonyurea if needed to keep cost low and avoid insulin.  Return in 3 months.

## 2021-05-22 NOTE — Assessment & Plan Note (Signed)
Chronic, ongoing.  Continue to collaborate with nephrology as needed and renal dose medication, Lisinopril for kidney protection.  BMP and CBC today.

## 2021-05-22 NOTE — Assessment & Plan Note (Signed)
Noted on exam, on anticoagulant therapy for a-fib.  Recommend gentle skin care at home and monitor for wounds, if present then immediately notify provider. 

## 2021-05-22 NOTE — Assessment & Plan Note (Signed)
Chronic, ongoing.  Continue with current regimen and adjust as needed based on labs.  Lipid panel today. 

## 2021-05-22 NOTE — Assessment & Plan Note (Signed)
Recheck CBC every 6 months (ordered today), no current symptoms.  Previously followed by Dr. Kizzie Bane.  CBC today.

## 2021-05-22 NOTE — Assessment & Plan Note (Signed)
Chronic, sees cardiology.  Continue current medication regimen as prescribed by cardiology at this time and adjust as needed.  Recent cardiology note reviewed.  Continue collaboration.

## 2021-05-22 NOTE — Assessment & Plan Note (Signed)
BMI 29.32, has lost 7 pounds.  Recommended eating smaller high protein, low fat meals more frequently and exercising 30 mins a day 5 times a week with a goal of 10-15lb weight loss in the next 3 months. Patient voiced their understanding and motivation to adhere to these recommendations.

## 2021-05-22 NOTE — Assessment & Plan Note (Signed)
Chronic, ongoing.  Continue current medication regimen and adjust as needed based on labs.  Thyroid labs stable recent check.

## 2021-05-23 ENCOUNTER — Other Ambulatory Visit: Payer: Self-pay | Admitting: Nurse Practitioner

## 2021-05-23 DIAGNOSIS — N1832 Chronic kidney disease, stage 3b: Secondary | ICD-10-CM

## 2021-05-23 DIAGNOSIS — D696 Thrombocytopenia, unspecified: Secondary | ICD-10-CM

## 2021-05-23 LAB — CBC WITH DIFFERENTIAL/PLATELET
Basophils Absolute: 0 10*3/uL (ref 0.0–0.2)
Basos: 1 %
EOS (ABSOLUTE): 0.1 10*3/uL (ref 0.0–0.4)
Eos: 4 %
Hematocrit: 39.4 % (ref 37.5–51.0)
Hemoglobin: 13.3 g/dL (ref 13.0–17.7)
Immature Grans (Abs): 0 10*3/uL (ref 0.0–0.1)
Immature Granulocytes: 0 %
Lymphocytes Absolute: 0.8 10*3/uL (ref 0.7–3.1)
Lymphs: 24 %
MCH: 29.1 pg (ref 26.6–33.0)
MCHC: 33.8 g/dL (ref 31.5–35.7)
MCV: 86 fL (ref 79–97)
Monocytes Absolute: 0.4 10*3/uL (ref 0.1–0.9)
Monocytes: 12 %
Neutrophils Absolute: 2 10*3/uL (ref 1.4–7.0)
Neutrophils: 59 %
Platelets: 127 10*3/uL — ABNORMAL LOW (ref 150–450)
RBC: 4.57 x10E6/uL (ref 4.14–5.80)
RDW: 12.9 % (ref 11.6–15.4)
WBC: 3.3 10*3/uL — ABNORMAL LOW (ref 3.4–10.8)

## 2021-05-23 LAB — BASIC METABOLIC PANEL
BUN/Creatinine Ratio: 14 (ref 10–24)
BUN: 28 mg/dL — ABNORMAL HIGH (ref 8–27)
CO2: 22 mmol/L (ref 20–29)
Calcium: 9.2 mg/dL (ref 8.6–10.2)
Chloride: 102 mmol/L (ref 96–106)
Creatinine, Ser: 2.01 mg/dL — ABNORMAL HIGH (ref 0.76–1.27)
Glucose: 174 mg/dL — ABNORMAL HIGH (ref 70–99)
Potassium: 4.7 mmol/L (ref 3.5–5.2)
Sodium: 139 mmol/L (ref 134–144)
eGFR: 34 mL/min/{1.73_m2} — ABNORMAL LOW (ref >59)

## 2021-05-23 LAB — LIPID PANEL W/O CHOL/HDL RATIO
Cholesterol, Total: 141 mg/dL (ref 100–199)
HDL: 45 mg/dL (ref >39)
LDL Chol Calc (NIH): 65 mg/dL (ref 0–99)
Triglycerides: 188 mg/dL — ABNORMAL HIGH (ref 0–149)
VLDL Cholesterol Cal: 31 mg/dL (ref 5–40)

## 2021-05-23 LAB — VITAMIN D 25 HYDROXY (VIT D DEFICIENCY, FRACTURES): Vit D, 25-Hydroxy: 43.1 ng/mL (ref 30.0–100.0)

## 2021-05-23 NOTE — Progress Notes (Signed)
Contacted via [facility]  -- need lab only visit scheduled for 4 weeks please.   Good morning Mr. Karbowski, your labs have returned: - Kidney function, creatinine and eGFR, is showing some decline this check.  I would like to recheck this in 4 weeks via outpatient labs only.  If any further decline or eGFR remains in 30 range, I may have you return to nephrology.  If eGFR gets to be less then 30 we would have to stop Metformin.  Please increase fluid intake at home and avoid any Ibuprofen containing products.  My staff will call to schedule lab only visit. - Cholesterol levels show LDL at goal, continue medication. - CBC shows slightly low white blood cell count, we will recheck this in 4 weeks too as very mildly low.  Platelets remain on lower side, but no worsening.  We will continue to check every 6 months. - Vitamin D is normal.  Any questions? Keep being amazing!!  Thank you for allowing me to participate in your care.  I appreciate you. Kindest regards, Orson Rho

## 2021-06-01 ENCOUNTER — Ambulatory Visit (INDEPENDENT_AMBULATORY_CARE_PROVIDER_SITE_OTHER): Payer: Medicare Other | Admitting: *Deleted

## 2021-06-01 DIAGNOSIS — Z0000 Encounter for general adult medical examination without abnormal findings: Secondary | ICD-10-CM | POA: Diagnosis not present

## 2021-06-01 NOTE — Progress Notes (Signed)
Subjective:   Jonathan [name] is a 76 y.o. male who presents for Medicare Annual/Subsequent preventive examination.  I connected with  Jonathan [name] on 06/01/21 by a  telephoneenabled telemedicine application and verified that I am speaking with the correct person using two identifiers.   I discussed the limitations of evaluation and management by telemedicine. The patient expressed understanding and agreed to proceed.  Patient location: home  Provider location:  Tele-Health  not in office    Review of Systems     Cardiac Risk Factors include: advanced age (>17men, >60 women);diabetes mellitus;male gender;hypertension;obesity (BMI >30kg/m2)     Objective:    Today's Vitals   There is no height or weight on file to calculate BMI.  Advanced Directives 06/01/2021 05/30/2020 05/21/2019 05/18/2018 05/06/2017 08/13/2016 05/04/2016  Does Patient Have a Medical Advance Directive? Yes Yes Yes - Yes Yes Yes  Type of Estate agent of [employer] Power of [place];Living will Living will;Healthcare Power of [employer] Power of [place];Living will Healthcare Power of [place];Living will Healthcare Power of [place];Living will Healthcare Power of [place];Living will  Does patient want to make changes to medical advance directive? - - - No - Patient declined - - -  Copy of Healthcare Power of Attorney in Chart? Yes - validated most recent copy scanned in chart (See row information) No - copy requested Yes - validated most recent copy scanned in chart (See row information) No - copy requested No - copy requested No - copy requested -    Current Medications (verified) Outpatient Encounter Medications as of 06/01/2021  Medication Sig   amLODipine (NORVASC) 5 MG tablet Take 5 mg by mouth daily.   apixaban (ELIQUIS) 5 MG TABS tablet Take 5 mg by mouth in the morning and at bedtime.    atorvastatin (LIPITOR) 80 MG tablet Take 1 tablet (80 mg total) by mouth  daily.   Cholecalciferol (D3-1000) 25 MCG (1000 UT) capsule    cholecalciferol (VITAMIN D) 1000 units tablet Take 1,000 Units by mouth daily.   levothyroxine (SYNTHROID) 125 MCG tablet Take 1 tablet (125 mcg total) by mouth daily.   lisinopril (ZESTRIL) 20 MG tablet Take 1 tablet (20 mg total) by mouth daily.   magnesium oxide (MAG-OX) 400 MG tablet Take 1 tablet (400 mg total) by mouth daily.   metFORMIN (GLUCOPHAGE-XR) 500 MG 24 hr tablet Take 1 tablet (500 mg total) by mouth 2 (two) times daily.   metoprolol tartrate (LOPRESSOR) 25 MG tablet Take 25 mg by mouth 2 (two) times daily.   Multiple Vitamin (MULTIVITAMIN) tablet Take 1 tablet by mouth daily.   ONE TOUCH ULTRA TEST test strip USE TO CHECK SUGAR TWICE DAILY   TRULICITY 4.5 MG/0.5ML SOPN INJECT 4.5 MG AS DIRECTED ONCE A WEEK.   vitamin B-12 (CYANOCOBALAMIN) 1000 MCG tablet Take 1,000 mcg by mouth daily. Every other day   flecainide (TAMBOCOR) 50 MG tablet Take 100 mg by mouth in the morning and at bedtime.    No facility-administered encounter medications on file as of 06/01/2021.    Allergies (verified) Patient has no known allergies.   History: Past Medical History:  Diagnosis Date   Diabetes mellitus without complication (HCC)    Hyperlipidemia    Hypertension    Lymphopenia    Thrombocytopenia (HCC)    Past Surgical History:  Procedure Laterality Date   CARDIOVERSION N/A 11/22/2019   Procedure: CARDIOVERSION;  Surgeon: Lamar Blinks, MD;  Location: [facility] ORS;  Service: Cardiovascular;  Laterality: N/A;   heart ablation     KNEE SURGERY Bilateral    Family History  Problem Relation Age of Onset   Hypertension Mother    Lung cancer Father        lung   Lung disease Father    Hypertension Father    Cancer Sister        breast   Stomach cancer Sister    Healthy Daughter    Social History   Socioeconomic History   Marital status: Married    Spouse name: Not on file   Number of children: Not on file    Years of education: Not on file   Highest education level: Not on file  Occupational History   Occupation: retired  Tobacco Use   Smoking status: Former    Packs/day: 1.00    Years: 35.00    Pack years: 35.00    Types: Cigarettes    Quit date: 03/06/1978    Years since quitting: 43.2   Smokeless tobacco: Former    Quit date: 03/21/2007  Vaping Use   Vaping Use: Never used  Substance and Sexual Activity   Alcohol use: Yes    Alcohol/week: 2.0 standard drinks    Types: 2 Shots of liquor per week    Comment: approx. mixed drink 1-2x week   Drug use: No   Sexual activity: Yes  Other Topics Concern   Not on file  Social History Narrative   Not on file   Social Determinants of Health   Financial Resource Strain: Low Risk    Difficulty of Paying Living Expenses: Not hard at all  Food Insecurity: No Food Insecurity   Worried About Programme researcher, broadcasting/film/videounning Out of Food in the Last Year: Never true   Ran Out of Food in the Last Year: Never true  Transportation Needs: No Transportation Needs   Lack of Transportation (Medical): No   Lack of Transportation (Non-Medical): No  Physical Activity: Sufficiently Active   Days of Exercise per Week: 4 days   Minutes of Exercise per Session: 40 min  Stress: No Stress Concern Present   Feeling of Stress : Not at all  Social Connections: Moderately Integrated   Frequency of Communication with Friends and Family: More than three times a week   Frequency of Social Gatherings with Friends and Family: More than three times a week   Attends Religious Services: More than 4 times per year   Active Member of [employer]ubs or Organizations: No   Attends Engineer, structuralClub or Organization Meetings: Never   Marital Status: Married    Tobacco Counseling Counseling given: Not Answered   Clinical Intake:  Pre-visit preparation completed: Yes  Pain : No/denies pain     Nutritional Risks: None Diabetes: No  How often do you need to have someone help you when you read  instructions, pamphlets, or other written materials from your doctor or pharmacy?: 1 - Never  Diabetic?  Yes  Nutrition Risk Assessment:  Has the patient had any N/V/D within the last 2 months?  No  Does the patient have any non-healing wounds?  No  Has the patient had any unintentional weight loss or weight gain?  No   Diabetes:  Is the patient diabetic?  Yes  If diabetic, was a CBG obtained today?  No  Did the patient bring in their glucometer from home?  No  How often do you monitor your CBG's? 4 x a week.   Financial Strains and Diabetes Management:  Are you  having any financial strains with the device, your supplies or your medication? No .  Does the patient want to be seen by Chronic Care Management for management of their diabetes?  No  Would the patient like to be referred to a Nutritionist or for Diabetic Management?  No   Diabetic Exams:  Diabetic Eye Exam: Completed . Overdue for diabetic eye exam. Pt has been advised about the importance in completing this exam.   Diabetic Foot Exam . Pt has been advised about the importance in completing this exam.   Interpreter Needed?: No  Information entered by :: Remi HaggardJulie Jacob Cicero LPN   Activities of Daily Living In your present state of health, do you have any difficulty performing the following activities: 06/01/2021  Hearing? N  Vision? N  Difficulty concentrating or making decisions? N  Walking or climbing stairs? N  Dressing or bathing? N  Doing errands, shopping? N  Preparing Food and eating ? N  Using the Toilet? N  In the past six months, have you accidently leaked urine? N  Do you have problems with loss of bowel control? N  Managing your Medications? N  Managing your Finances? N  Housekeeping or managing your Housekeeping? N  Some recent data might be hidden    Patient Care Team: Marjie Skiffannady, Jolene T, NP as PCP - General (Nurse Practitioner) Lamar BlinksKowalski, Bruce J, MD as Consulting Physician (Cardiology) Lamont DowdyKolluru,  Sarath, MD as Consulting Physician (Nephrology)  Indicate any recent Medical Services you may have received from other than [facility] providers in the past year (date may be approximate).     Assessment:   This is a routine wellness examination for [place].  Hearing/Vision screen Hearing Screening - Comments:: No trouble hearing Some tinnitus  Vision Screening - Comments:: Up to date Vision  Dietary issues and exercise activities discussed: Current Exercise Habits: Home exercise routine, Type of exercise: walking, Time (Minutes): 40, Frequency (Times/Week): 4, Weekly Exercise (Minutes/Week): 160, Intensity: Moderate, Exercise limited by: None identified   Goals Addressed             This Visit's Progress    DIET - INCREASE WATER INTAKE   On track    Recommend drinking at least 6-8 glasses of water a day      Patient Stated       Stay healthy       Depression Screen PHQ 2/9 Scores 06/01/2021 08/11/2020 05/30/2020 05/21/2019 05/18/2018 05/06/2017 05/04/2016  PHQ - 2 Score 0 0 0 0 0 0 2    Fall Risk Fall Risk  06/01/2021 08/11/2020 05/30/2020 05/12/2020 05/21/2019  Falls in the past year? 0 0 0 0 0  Comment - - - - -  Number falls in past yr: 0 - - 0 0  Comment - - - - -  Injury with Fall? 0 - - 0 0  Risk for fall due to : - No Fall Risks Medication side effect - -  Follow up Falls evaluation completed;Education provided;Falls prevention discussed - Falls evaluation completed;Education provided;Falls prevention discussed - -    FALL RISK PREVENTION PERTAINING TO THE HOME:  Any stairs in or around the home? No  If so, are there any without handrails? No  Home free of loose throw rugs in walkways, pet beds, electrical cords, etc? Yes  Adequate lighting in your home to reduce risk of falls? Yes   ASSISTIVE DEVICES UTILIZED TO PREVENT FALLS:  Life alert? No  Use of a cane, walker or w/c? No  Grab  bars in the bathroom? No  Shower chair or bench in shower? Yes  Elevated toilet seat  or a handicapped toilet? No   TIMED UP AND GO:  Was the test performed? No .    Cognitive Function:  Normal cognitive status assessed by direct observation by this Nurse Health Advisor. No abnormalities found.       6CIT Screen 05/30/2020 05/18/2018 05/06/2017  What Year? 0 points 0 points 0 points  What month? 0 points 0 points 0 points  What time? 0 points 0 points 0 points  Count back from 20 2 points 0 points 0 points  Months in reverse 0 points 0 points 0 points  Repeat phrase 2 points 0 points 0 points  Total Score 4 0 0    Immunizations Immunization History  Administered Date(s) Administered   Fluad Quad(high Dose 65+) 03/12/2020, 02/12/2021   Influenza, High Dose Seasonal PF 01/17/2016, 01/16/2017, 01/03/2019   Influenza-Unspecified 02/17/2015, 01/06/2018   PFIZER(Purple Top)SARS-COV-2 Vaccination 08/30/2019, 09/25/2019, 04/21/2020   Pneumococcal Conjugate-13 09/18/2013, 10/10/2017   Pneumococcal Polysaccharide-23 05/21/2014, 12/18/2018   Td 03/27/2007   Tdap 09/18/2013, 12/14/2018   Zoster Recombinat (Shingrix) 09/17/2017, 01/06/2018   Zoster, Live 03/15/2012    TDAP status: Up to date  Flu Vaccine status: Up to date  Pneumococcal vaccine status: Up to date  Covid-19 vaccine status: Information provided on how to obtain vaccines.   Qualifies for Shingles Vaccine? No   Zostavax completed Yes   Shingrix Completed?: Yes  Screening Tests Health Maintenance  Topic Date Due   COVID-19 Vaccine (4 - Booster for Pfizer series) 06/07/2021 (Originally 06/16/2020)   HEMOGLOBIN A1C  11/20/2021   OPHTHALMOLOGY EXAM  01/21/2022   FOOT EXAM  02/12/2022   Fecal DNA (Cologuard)  02/19/2024   TETANUS/TDAP  12/13/2028   Pneumonia Vaccine 11+ Years old  Completed   INFLUENZA VACCINE  Completed   Hepatitis C Screening  Completed   Zoster Vaccines- Shingrix  Completed   HPV VACCINES  Aged Out    Health Maintenance  There are no preventive care reminders to display  for this patient.  Colorectal cancer screening: Type of screening: Cologuard. Completed 2022. Repeat every 3 years  Lung Cancer Screening: (Low Dose CT Chest recommended if Age 64-80 years, 30 pack-year currently smoking OR have quit w/in 15years.) does not qualify.   Lung Cancer Screening Referral:   Additional Screening:  Hepatitis C Screening: does not qualify; Completed 2016  Vision Screening: Recommended annual ophthalmology exams for early detection of glaucoma and other disorders of the eye. Is the patient up to date with their annual eye exam?  Yes  Who is the provider or what is the name of the office in which the patient attends annual eye exams? Vision If pt is not established with a provider, would they like to be referred to a provider to establish care? No .   Dental Screening: Recommended annual dental exams for proper oral hygiene  Community Resource Referral / Chronic Care Management: CRR required this visit?  No   CCM required this visit?  No      Plan:     I have personally reviewed and noted the following in the patients chart:   Medical and social history Use of alcohol, tobacco or illicit drugs  Current medications and supplements including opioid prescriptions. Patient is not currently taking opioid prescriptions. Functional ability and status Nutritional status Physical activity Advanced directives List of other physicians Hospitalizations, surgeries, and ER visits in previous 12  months Vitals Screenings to include cognitive, depression, and falls Referrals and appointments  In addition, I have reviewed and discussed with patient certain preventive protocols, quality metrics, and best practice recommendations. A written personalized care plan for preventive services as well as general preventive health recommendations were provided to patient.     Remi Haggard, LPN   06/27/5571   Nurse Notes:

## 2021-06-01 NOTE — Patient Instructions (Signed)
Mr. Jonathan [name] , Thank you for taking time to come for your Medicare Wellness Visit. I appreciate your ongoing commitment to your health goals. Please review the following plan we discussed and let me know if I can assist you in the future.   Screening recommendations/referrals: Colonoscopy: up to date Recommended yearly ophthalmology/optometry visit for glaucoma screening and checkup Recommended yearly dental visit for hygiene and checkup  Vaccinations: Influenza vaccine: up to date Pneumococcal vaccine: up to date Tdap vaccine: up to date Shingles vaccine: up to date    Advanced directives: on file  Conditions/risks identified:   Next appointment: 08-20-2021 @ 3:30  [facility]  Preventive Care 65 Years and Older, Male Preventive care refers to lifestyle choices and visits with your health care provider that can promote health and wellness. What does preventive care include? A yearly physical exam. This is also called an annual well check. Dental exams once or twice a year. Routine eye exams. Ask your health care provider how often you should have your eyes checked. Personal lifestyle choices, including: Daily care of your teeth and gums. Regular physical activity. Eating a healthy diet. Avoiding tobacco and drug use. Limiting alcohol use. Practicing safe sex. Taking low doses of aspirin every day. Taking vitamin and mineral supplements as recommended by your health care provider. What happens during an annual well check? The services and screenings done by your health care provider during your annual well check will depend on your age, overall health, lifestyle risk factors, and family history of disease. Counseling  Your health care provider may ask you questions about your: Alcohol use. Tobacco use. Drug use. Emotional well-being. Home and relationship well-being. Sexual activity. Eating habits. History of falls. Memory and ability to understand (cognition). Work and work  Astronomer. Screening  You may have the following tests or measurements: Height, weight, and BMI. Blood pressure. Lipid and cholesterol levels. These may be checked every 5 years, or more frequently if you are over 79 years old. Skin check. Lung cancer screening. You may have this screening every year starting at age 88 if you have a 30-pack-year history of smoking and currently smoke or have quit within the past 15 years. Fecal occult blood test (FOBT) of the stool. You may have this test every year starting at age 42. Flexible sigmoidoscopy or colonoscopy. You may have a sigmoidoscopy every 5 years or a colonoscopy every 10 years starting at age 22. Prostate cancer screening. Recommendations will vary depending on your family history and other risks. Hepatitis C blood test. Hepatitis B blood test. Sexually transmitted disease (STD) testing. Diabetes screening. This is done by checking your blood sugar (glucose) after you have not eaten for a while (fasting). You may have this done every 1-3 years. Abdominal aortic aneurysm (AAA) screening. You may need this if you are a current or former smoker. Osteoporosis. You may be screened starting at age 67 if you are at high risk. Talk with your health care provider about your test results, treatment options, and if necessary, the need for more tests. Vaccines  Your health care provider may recommend certain vaccines, such as: Influenza vaccine. This is recommended every year. Tetanus, diphtheria, and acellular pertussis (Tdap, Td) vaccine. You may need a Td booster every 10 years. Zoster vaccine. You may need this after age 22. Pneumococcal 13-valent conjugate (PCV13) vaccine. One dose is recommended after age 77. Pneumococcal polysaccharide (PPSV23) vaccine. One dose is recommended after age 51. Talk to your health care provider about which screenings  and vaccines you need and how often you need them. This information is not intended to replace  advice given to you by your health care provider. Make sure you discuss any questions you have with your health care provider. Document Released: 06/06/2015 Document Revised: 01/28/2016 Document Reviewed: 03/11/2015 Elsevier Interactive Patient Education  2017 [place] Prevention in the Home Falls can cause injuries. They can happen to people of all ages. There are many things you can do to make your home safe and to help prevent falls. What can I do on the outside of my home? Regularly fix the edges of walkways and driveways and fix any cracks. Remove anything that might make you trip as you walk through a door, such as a raised step or threshold. Trim any bushes or trees on the path to your home. Use bright outdoor lighting. Clear any walking paths of anything that might make someone trip, such as rocks or tools. Regularly check to see if handrails are loose or broken. Make sure that both sides of any steps have handrails. Any raised decks and porches should have guardrails on the edges. Have any leaves, snow, or ice cleared regularly. Use sand or salt on walking paths during winter. Clean up any spills in your garage right away. This includes oil or grease spills. What can I do in the bathroom? Use night lights. Install grab bars by the toilet and in the tub and shower. Do not use towel bars as grab bars. Use non-skid mats or decals in the tub or shower. If you need to sit down in the shower, use a plastic, non-slip stool. Keep the floor dry. Clean up any water that spills on the floor as soon as it happens. Remove soap buildup in the tub or shower regularly. Attach bath mats securely with double-sided non-slip rug tape. Do not have throw rugs and other things on the floor that can make you trip. What can I do in the bedroom? Use night lights. Make sure that you have a light by your bed that is easy to reach. Do not use any sheets or blankets that are too big for your bed.  They should not hang down onto the floor. Have a firm chair that has side arms. You can use this for support while you get dressed. Do not have throw rugs and other things on the floor that can make you trip. What can I do in the kitchen? Clean up any spills right away. Avoid walking on wet floors. Keep items that you use a lot in easy-to-reach places. If you need to reach something above you, use a strong step stool that has a grab bar. Keep electrical cords out of the way. Do not use floor polish or wax that makes floors slippery. If you must use wax, use non-skid floor wax. Do not have throw rugs and other things on the floor that can make you trip. What can I do with my stairs? Do not leave any items on the stairs. Make sure that there are handrails on both sides of the stairs and use them. Fix handrails that are broken or loose. Make sure that handrails are as long as the stairways. Check any carpeting to make sure that it is firmly attached to the stairs. Fix any carpet that is loose or worn. Avoid having throw rugs at the top or bottom of the stairs. If you do have throw rugs, attach them to the floor with carpet tape.  Make sure that you have a light switch at the top of the stairs and the bottom of the stairs. If you do not have them, ask someone to add them for you. What else can I do to help prevent falls? Wear shoes that: Do not have high heels. Have rubber bottoms. Are comfortable and fit you well. Are closed at the toe. Do not wear sandals. If you use a stepladder: Make sure that it is fully opened. Do not climb a closed stepladder. Make sure that both sides of the stepladder are locked into place. Ask someone to hold it for you, if possible. Clearly mark and make sure that you can see: Any grab bars or handrails. First and last steps. Where the edge of each step is. Use tools that help you move around (mobility aids) if they are needed. These  include: Canes. Walkers. Scooters. Crutches. Turn on the lights when you go into a dark area. Replace any light bulbs as soon as they burn out. Set up your furniture so you have a clear path. Avoid moving your furniture around. If any of your floors are uneven, fix them. If there are any pets around you, be aware of where they are. Review your medicines with your doctor. Some medicines can make you feel dizzy. This can increase your chance of falling. Ask your doctor what other things that you can do to help prevent falls. This information is not intended to replace advice given to you by your health care provider. Make sure you discuss any questions you have with your health care provider. Document Released: 03/06/2009 Document Revised: 10/16/2015 Document Reviewed: 06/14/2014 Elsevier Interactive Patient Education  2017 [employer].

## 2021-06-20 ENCOUNTER — Encounter: Payer: Self-pay | Admitting: Nurse Practitioner

## 2021-06-23 ENCOUNTER — Other Ambulatory Visit: Payer: Self-pay

## 2021-06-23 ENCOUNTER — Other Ambulatory Visit: Payer: Medicare Other

## 2021-06-23 DIAGNOSIS — N1832 Chronic kidney disease, stage 3b: Secondary | ICD-10-CM

## 2021-06-23 DIAGNOSIS — D696 Thrombocytopenia, unspecified: Secondary | ICD-10-CM

## 2021-06-23 LAB — MICROALBUMIN, URINE WAIVED
Creatinine, Urine Waived: 300 mg/dL (ref 10–300)
Microalb, Ur Waived: 80 mg/L — ABNORMAL HIGH (ref 0–19)

## 2021-06-24 LAB — CBC WITH DIFFERENTIAL/PLATELET
Basophils Absolute: 0 10*3/uL (ref 0.0–0.2)
Basos: 1 %
EOS (ABSOLUTE): 0.1 10*3/uL (ref 0.0–0.4)
Eos: 3 %
Hematocrit: 39.7 % (ref 37.5–51.0)
Hemoglobin: 13.5 g/dL (ref 13.0–17.7)
Immature Grans (Abs): 0 10*3/uL (ref 0.0–0.1)
Immature Granulocytes: 0 %
Lymphocytes Absolute: 1 10*3/uL (ref 0.7–3.1)
Lymphs: 27 %
MCH: 29.2 pg (ref 26.6–33.0)
MCHC: 34 g/dL (ref 31.5–35.7)
MCV: 86 fL (ref 79–97)
Monocytes Absolute: 0.4 10*3/uL (ref 0.1–0.9)
Monocytes: 11 %
Neutrophils Absolute: 2.3 10*3/uL (ref 1.4–7.0)
Neutrophils: 58 %
Platelets: 116 10*3/uL — ABNORMAL LOW (ref 150–450)
RBC: 4.62 x10E6/uL (ref 4.14–5.80)
RDW: 12.9 % (ref 11.6–15.4)
WBC: 3.9 10*3/uL (ref 3.4–10.8)

## 2021-06-24 LAB — BASIC METABOLIC PANEL
BUN/Creatinine Ratio: 12 (ref 10–24)
BUN: 23 mg/dL (ref 8–27)
CO2: 23 mmol/L (ref 20–29)
Calcium: 9.3 mg/dL (ref 8.6–10.2)
Chloride: 100 mmol/L (ref 96–106)
Creatinine, Ser: 1.86 mg/dL — ABNORMAL HIGH (ref 0.76–1.27)
Glucose: 145 mg/dL — ABNORMAL HIGH (ref 70–99)
Potassium: 4.8 mmol/L (ref 3.5–5.2)
Sodium: 140 mmol/L (ref 134–144)
eGFR: 37 mL/min/{1.73_m2} — ABNORMAL LOW (ref >59)

## 2021-06-24 LAB — PTH, INTACT AND CALCIUM: PTH: 29 pg/mL (ref 15–65)

## 2021-06-24 NOTE — Progress Notes (Signed)
Contacted via MyChart   Good evening Jonathan [name], your labs have returned.  CBC now shows normal white blood cell count, good news.  Platelets remain on lower side, but this is baseline for you and we will continue to monitor.  Kidney function, creatinine and eGFR, remains in stage 3b kidney disease -- for now since the eGFR is >30 we can continue Metformin 500 MG twice a day, but if this goes <30 we will need to stop medication.  Parathyroid lab normal.  I recommend we recheck kidney function next visit, if eGFR remains in 30 range I would recommend a return visit to kidney doctor.  Any questions? Keep being awesome!!  Thank you for allowing me to participate in your care.  I appreciate you. Kindest regards, Cash Duce

## 2021-08-13 DIAGNOSIS — Z5181 Encounter for therapeutic drug level monitoring: Secondary | ICD-10-CM | POA: Diagnosis not present

## 2021-08-13 DIAGNOSIS — Z79899 Other long term (current) drug therapy: Secondary | ICD-10-CM | POA: Diagnosis not present

## 2021-08-13 DIAGNOSIS — Z8679 Personal history of other diseases of the circulatory system: Secondary | ICD-10-CM | POA: Diagnosis not present

## 2021-08-13 DIAGNOSIS — E119 Type 2 diabetes mellitus without complications: Secondary | ICD-10-CM | POA: Diagnosis not present

## 2021-08-13 DIAGNOSIS — I480 Paroxysmal atrial fibrillation: Secondary | ICD-10-CM | POA: Diagnosis not present

## 2021-08-13 DIAGNOSIS — I10 Essential (primary) hypertension: Secondary | ICD-10-CM | POA: Diagnosis not present

## 2021-08-13 DIAGNOSIS — I6523 Occlusion and stenosis of bilateral carotid arteries: Secondary | ICD-10-CM | POA: Diagnosis not present

## 2021-08-13 DIAGNOSIS — E782 Mixed hyperlipidemia: Secondary | ICD-10-CM | POA: Diagnosis not present

## 2021-08-13 DIAGNOSIS — Z9889 Other specified postprocedural states: Secondary | ICD-10-CM | POA: Diagnosis not present

## 2021-08-16 NOTE — Patient Instructions (Addendum)
Jonathan [name] cost? ? ?Start taking Amlodipine every other day and alert me if blood pressure gets higher then 130/90. ? ?Diabetes Mellitus and Nutrition, Adult ?When you have diabetes, or diabetes mellitus, it is very important to have healthy eating habits because your blood sugar (glucose) levels are greatly affected by what you eat and drink. Eating healthy foods in the right amounts, at about the same times every day, can help you: ?Manage your blood glucose. ?Lower your risk of heart disease. ?Improve your blood pressure. ?Reach or maintain a healthy weight. ?What can affect my meal plan? ?Every person with diabetes is different, and each person has different needs for a meal plan. Your health care provider may recommend that you work with a dietitian to make a meal plan that is best for you. Your meal plan may vary depending on factors such as: ?The calories you need. ?The medicines you take. ?Your weight. ?Your blood glucose, blood pressure, and cholesterol levels. ?Your activity level. ?Other health conditions you have, such as heart or kidney disease. ?How do carbohydrates affect me? ?Carbohydrates, also called carbs, affect your blood glucose level more than any other type of food. Eating carbs raises the amount of glucose in your blood. ?It is important to know how many carbs you can safely have in each meal. This is different for every person. Your dietitian can help you calculate how many carbs you should have at each meal and for each snack. ?How does alcohol affect me? ?Alcohol can cause a decrease in blood glucose (hypoglycemia), especially if you use insulin or take certain diabetes medicines by mouth. Hypoglycemia can be a life-threatening condition. Symptoms of hypoglycemia, such as sleepiness, dizziness, and confusion, are similar to symptoms of having too much alcohol. ?Do not drink alcohol if: ?Your health care provider tells you not to drink. ?You are pregnant, may be pregnant, or are planning to  become pregnant. ?If you drink alcohol: ?Limit how much you have to: ?0-1 drink a day for women. ?0-2 drinks a day for men. ?Know how much alcohol is in your drink. In the [place] one drink equals one 12 oz bottle of beer (355 mL), one 5 oz glass of wine (148 mL), or one 1? oz glass of hard liquor (44 mL). ?Keep yourself hydrated with water, diet soda, or unsweetened iced tea. Keep in mind that regular soda, juice, and other mixers may contain a lot of sugar and must be counted as carbs. ?What are tips for following this plan? ?Reading food labels ?Start by checking the serving size on the Nutrition Facts label of packaged foods and drinks. The number of calories and the amount of carbs, fats, and other nutrients listed on the label are based on one serving of the item. Many items contain more than one serving per package. ?Check the total grams (g) of carbs in one serving. ?Check the number of grams of saturated fats and trans fats in one serving. Choose foods that have a low amount or none of these fats. ?Check the number of milligrams (mg) of salt (sodium) in one serving. Most people should limit total sodium intake to less than 2,300 mg per day. ?Always check the nutrition information of foods labeled as "low-fat" or "nonfat." These foods may be higher in added sugar or refined carbs and should be avoided. ?Talk to your dietitian to identify your daily goals for nutrients listed on the label. ?Shopping ?Avoid buying canned, pre-made, or processed foods. These foods tend to be high  in fat, sodium, and added sugar. ?Shop around the outside edge of the grocery store. This is where you will most often find fresh fruits and vegetables, bulk grains, fresh meats, and fresh dairy products. ?Cooking ?Use low-heat cooking methods, such as baking, instead of high-heat cooking methods, such as deep frying. ?Cook using healthy oils, such as olive, canola, or sunflower oil. ?Avoid cooking with butter, cream, or high-fat  meats. ?Meal planning ?Eat meals and snacks regularly, preferably at the same times every day. Avoid going long periods of time without eating. ?Eat foods that are high in fiber, such as fresh fruits, vegetables, beans, and whole grains. ?Eat 4-6 oz (112-168 g) of lean protein each day, such as lean meat, chicken, fish, eggs, or tofu. One ounce (oz) (28 g) of lean protein is equal to: ?1 oz (28 g) of meat, chicken, or fish. ?1 egg. ?? cup (62 g) of tofu. ?Eat some foods each day that contain healthy fats, such as avocado, nuts, seeds, and fish. ?What foods should I eat? ?Fruits ?Berries. Apples. Oranges. Peaches. Apricots. Plums. Grapes. Mangoes. Papayas. Pomegranates. Kiwi. Cherries. ?Vegetables ?Leafy greens, including lettuce, spinach, kale, chard, collard greens, mustard greens, and cabbage. Beets. Cauliflower. Broccoli. Carrots. Green beans. Tomatoes. Peppers. Onions. Cucumbers. Brussels sprouts. ?Grains ?Whole grains, such as whole-wheat or whole-grain bread, crackers, tortillas, cereal, and pasta. Unsweetened oatmeal. Quinoa. Brown or wild rice. ?Meats and other proteins ?Seafood. Poultry without skin. Lean cuts of poultry and beef. Tofu. Nuts. Seeds. ?Dairy ?Low-fat or fat-free dairy products such as milk, yogurt, and cheese. ?The items listed above may not be a complete list of foods and beverages you can eat and drink. Contact a dietitian for more information. ?What foods should I avoid? ?Fruits ?Fruits canned with syrup. ?Vegetables ?Canned vegetables. Frozen vegetables with butter or cream sauce. ?Grains ?Refined white flour and flour products such as bread, pasta, snack foods, and cereals. Avoid all processed foods. ?Meats and other proteins ?Fatty cuts of meat. Poultry with skin. Breaded or fried meats. Processed meat. Avoid saturated fats. ?Dairy ?Full-fat yogurt, cheese, or milk. ?Beverages ?Sweetened drinks, such as soda or iced tea. ?The items listed above may not be a complete list of foods and  beverages you should avoid. Contact a dietitian for more information. ?Questions to ask a health care provider ?Do I need to meet with a certified diabetes care and education specialist? ?Do I need to meet with a dietitian? ?What number can I call if I have questions? ?When are the best times to check my blood glucose? ?Where to find more information: ?American Diabetes Association: diabetes.org ?Academy of Nutrition and Dietetics: eatright.org ?[employer] of Diabetes and Digestive and Kidney Diseases: StageSync.si ?Association of Diabetes Care & Education Specialists: diabeteseducator.org ?Summary ?It is important to have healthy eating habits because your blood sugar (glucose) levels are greatly affected by what you eat and drink. It is important to use alcohol carefully. ?A healthy meal plan will help you manage your blood glucose and lower your risk of heart disease. ?Your health care provider may recommend that you work with a dietitian to make a meal plan that is best for you. ?This information is not intended to replace advice given to you by your health care provider. Make sure you discuss any questions you have with your health care provider. ?Document Revised: 12/12/2019 Document Reviewed: 12/12/2019 ?Elsevier Patient Education ? 2022 Elsevier Inc. ? ?

## 2021-08-20 ENCOUNTER — Encounter: Payer: Self-pay | Admitting: Nurse Practitioner

## 2021-08-20 ENCOUNTER — Ambulatory Visit (INDEPENDENT_AMBULATORY_CARE_PROVIDER_SITE_OTHER): Payer: Medicare Other | Admitting: Nurse Practitioner

## 2021-08-20 VITALS — BP 108/64 | HR 77 | Temp 97.4°F | Wt 197.6 lb

## 2021-08-20 DIAGNOSIS — E039 Hypothyroidism, unspecified: Secondary | ICD-10-CM | POA: Diagnosis not present

## 2021-08-20 DIAGNOSIS — E1122 Type 2 diabetes mellitus with diabetic chronic kidney disease: Secondary | ICD-10-CM | POA: Diagnosis not present

## 2021-08-20 DIAGNOSIS — E6609 Other obesity due to excess calories: Secondary | ICD-10-CM | POA: Diagnosis not present

## 2021-08-20 DIAGNOSIS — Z9889 Other specified postprocedural states: Secondary | ICD-10-CM | POA: Diagnosis not present

## 2021-08-20 DIAGNOSIS — N1832 Chronic kidney disease, stage 3b: Secondary | ICD-10-CM | POA: Diagnosis not present

## 2021-08-20 DIAGNOSIS — E1169 Type 2 diabetes mellitus with other specified complication: Secondary | ICD-10-CM

## 2021-08-20 DIAGNOSIS — I480 Paroxysmal atrial fibrillation: Secondary | ICD-10-CM | POA: Diagnosis not present

## 2021-08-20 DIAGNOSIS — D696 Thrombocytopenia, unspecified: Secondary | ICD-10-CM

## 2021-08-20 DIAGNOSIS — E785 Hyperlipidemia, unspecified: Secondary | ICD-10-CM | POA: Diagnosis not present

## 2021-08-20 DIAGNOSIS — I152 Hypertension secondary to endocrine disorders: Secondary | ICD-10-CM | POA: Diagnosis not present

## 2021-08-20 DIAGNOSIS — D692 Other nonthrombocytopenic purpura: Secondary | ICD-10-CM

## 2021-08-20 DIAGNOSIS — Z6830 Body mass index (BMI) 30.0-30.9, adult: Secondary | ICD-10-CM

## 2021-08-20 DIAGNOSIS — E1159 Type 2 diabetes mellitus with other circulatory complications: Secondary | ICD-10-CM | POA: Diagnosis not present

## 2021-08-20 DIAGNOSIS — I6523 Occlusion and stenosis of bilateral carotid arteries: Secondary | ICD-10-CM | POA: Diagnosis not present

## 2021-08-20 DIAGNOSIS — Z8679 Personal history of other diseases of the circulatory system: Secondary | ICD-10-CM

## 2021-08-20 LAB — BAYER DCA HB A1C WAIVED: HB A1C (BAYER DCA - WAIVED): 6.2 % — ABNORMAL HIGH (ref 4.8–5.6)

## 2021-08-20 LAB — MICROALBUMIN, URINE WAIVED
Creatinine, Urine Waived: 300 mg/dL (ref 10–300)
Microalb, Ur Waived: 30 mg/L — ABNORMAL HIGH (ref 0–19)
Microalb/Creat Ratio: <30 mg/g (ref <30)

## 2021-08-20 NOTE — Progress Notes (Signed)
? ?BP 108/64 (BP Location: Left Arm, Patient Position: Sitting, Cuff Size: Normal)   Pulse 77   Temp (!) 97.4 ?F (36.3 ?C) (Oral)   Wt 197 lb 9.6 oz (89.6 kg)   SpO2 98%   BMI 28.94 kg/m?   ? ?Subjective:  ? ? Patient ID: Jonathan [name], male    DOB: [birth year], 76 y.o.   MRN: [id number] ? ?HPI: ?Jonathan [name] is a 76 y.o. male ? ?Chief Complaint  ?Patient presents with  ? Diabetes  ? Hyperlipidemia  ? Hypertension  ? Atrial Fibrillation  ? Chronic Kidney Disease  ? Hypothyroidism  ? ?DIABETES ?A1c 7.6% in December. Currently taking Trulicity 4.5 MG weekly (still having difficulty obtaining this) and Metformin XR 500 MG BID.   ? ?Tried Invokana, but was discontinued in past due to kidney function. ?Hypoglycemic episodes:no ?Polydipsia/polyuria: no ?Visual disturbance: no ?Chest pain: no ?Paresthesias: no ?Glucose Monitoring: yes ?            Accucheck frequency: not checking ?            Fasting glucose:  ?            Post prandial:  ?            Evening:  ?            Before meals: ?Taking Insulin?: no ?            Long acting insulin: ?            Short acting insulin: ?Blood Pressure Monitoring: daily ?Retinal Examination: Up to date  ?Foot Exam: Up to Date ?Pneumovax: Up to Date ?Influenza: Up to Date ?Aspirin: no  ? ?HYPERTENSION / HYPERLIPIDEMIA ?Continues on Amlodipine, Lisinopril, Metoprolol, Atorvastatin, Apixaban. ?Satisfied with current treatment? yes ?Duration of hypertension: chronic ?BP monitoring frequency: occasional ?BP range: 117/61 at home this morning ?BP medication side effects: no ?Duration of hyperlipidemia: chronic ?Cholesterol medication side effects: no ?Cholesterol supplements: none ?Medication compliance: good compliance ?Aspirin: no ?Recent stressors: no ?Recurrent headaches: no ?Visual changes: no ?Palpitations: no ?Dyspnea: no ?Chest pain: no ?Lower extremity edema: no ?Dizzy/lightheaded: no  ?  ?CHRONIC KIDNEY DISEASE ?CKD status: stable ?Medications renally dose: yes ?Previous  renal evaluation: yes ?Pneumovax:  Up to Date ?Influenza Vaccine:  Up to Date ?  ?ATRIAL FIBRILLATION ?Saw Dr. Gwen Pounds last 08/13/21 -- continues Metoprolol 25 MG BID, Eliquis, Flecainide 100 MG BID, Lisinopril. Continues on Atorvastatin for HLD.   ?  ?Saw Dr. Maisie Fus from [facility] in 2021 about left atrial appendage closure device, but has opted not to do this at this time. Last EF was > 55% on 02/09/21. ?Atrial fibrillation status: controlled ?Satisfied with current treatment: stable ?Medication side effects:  no ?Medication compliance: good compliance ?Etiology of atrial fibrillation:  ?Palpitations:  no ?Chest pain:  no ?Dyspnea on exertion:  no ?Orthopnea:  no ?Syncope:  no ?Edema:  no ?Ventricular rate control: B-blocker ?Anti-coagulation: long acting ?  ?HYPOTHYROIDISM ?Continues Levothyroxine 125 MCG daily.   ?Thyroid control status:controlled ?Satisfied with current treatment? no ?Medication side effects: no ?Medication compliance: good compliance ?Etiology of hypothyroidism:  ?Recent dose adjustment:no ?Fatigue: no ?Cold intolerance: no ?Heat intolerance: no ?Weight gain: no ?Weight loss: no ?Constipation: no ?Diarrhea/loose stools: no ?Palpitations: no ?Lower extremity edema: no ?Anxiety/depressed mood: no  ? ?Relevant past medical, surgical, family and social history reviewed and updated as indicated. Interim medical history since our last visit reviewed. ?Allergies and medications reviewed and updated. ? ?Review  of Systems  ?Constitutional:  Negative for activity change, diaphoresis, fatigue and fever.  ?Respiratory:  Negative for cough, chest tightness, shortness of breath and wheezing.   ?Cardiovascular:  Negative for chest pain, palpitations and leg swelling.  ?Gastrointestinal: Negative.   ?Endocrine: Negative for cold intolerance, heat intolerance, polydipsia, polyphagia and polyuria.  ?Neurological: Negative.   ?Psychiatric/Behavioral: Negative.    ? ?Per HPI unless specifically indicated above ? ?    ?Objective:  ?  ?BP 108/64 (BP Location: Left Arm, Patient Position: Sitting, Cuff Size: Normal)   Pulse 77   Temp (!) 97.4 ?F (36.3 ?C) (Oral)   Wt 197 lb 9.6 oz (89.6 kg)   SpO2 98%   BMI 28.94 kg/m?   ?Wt Readings from Last 3 Encounters:  ?08/20/21 197 lb 9.6 oz (89.6 kg)  ?05/22/21 200 lb 3.2 oz (90.8 kg)  ?02/12/21 207 lb 12.8 oz (94.3 kg)  ?  ?Physical Exam ?Vitals and nursing note reviewed.  ?Constitutional:   ?   General: He is awake. He is not in acute distress. ?   Appearance: He is well-developed, well-groomed and overweight. He is not ill-appearing.  ?HENT:  ?   Head: Normocephalic and atraumatic.  ?   Right Ear: Hearing normal. No drainage.  ?   Left Ear: Hearing normal. No drainage.  ?Eyes:  ?   General: Lids are normal.     ?   Right eye: No discharge.     ?   Left eye: No discharge.  ?   Conjunctiva/sclera: Conjunctivae normal.  ?   Pupils: Pupils are equal, round, and reactive to light.  ?Neck:  ?   Trachea: Trachea normal.  ?Cardiovascular:  ?   Rate and Rhythm: Normal rate and regular rhythm.  ?   Heart sounds: Normal heart sounds, S1 normal and S2 normal. No murmur heard. ?  No gallop.  ?Pulmonary:  ?   Effort: Pulmonary effort is normal. No accessory muscle usage or respiratory distress.  ?   Breath sounds: Normal breath sounds.  ?Abdominal:  ?   General: Bowel sounds are normal.  ?   Palpations: Abdomen is soft. There is no hepatomegaly or splenomegaly.  ?Musculoskeletal:     ?   General: Normal range of motion.  ?   Cervical back: Normal range of motion and neck supple.  ?   Right lower leg: No edema.  ?   Left lower leg: No edema.  ?Skin: ?   General: Skin is warm and dry.  ?Neurological:  ?   Mental Status: He is alert and oriented to person, place, and time.  ?Psychiatric:     ?   Attention and Perception: Attention normal.     ?   Mood and Affect: Mood normal.     ?   Speech: Speech normal.     ?   Behavior: Behavior normal. Behavior is cooperative.     ?   Thought Content: Thought  content normal.  ? ?Results for orders placed or performed in visit on 08/20/21  ?Bayer DCA Hb A1c Waived  ?Result Value Ref Range  ? HB A1C (BAYER DCA - WAIVED) 6.2 (H) 4.8 - 5.6 %  ?Microalbumin, Urine Waived  ?Result Value Ref Range  ? Microalb, Ur Waived 30 (H) 0 - 19 mg/L  ? Creatinine, Urine Waived 300 10 - 300 mg/dL  ? Microalb/Creat Ratio <30 <30 mg/g  ? ?   ?Assessment & Plan:  ? ?Problem List Items Addressed This Visit   ? ?  ?  Cardiovascular and Mediastinum  ? Bilateral carotid artery stenosis  ?  Noted on past imaging, may benefit return to vascular in future.  Continue preventative medications. ?  ?  ? Hypertension associated with diabetes (HCC)  ?  Chronic, ongoing. BP at goal in office and at home (on lower side) -- recommend he trial cutting back on Amlodipine to every other day and continue to reduce if BP remains <130/90.  Recommend he monitor BP at least a few mornings a week at home and document.  DASH diet at home.  Continue remainder of current medication regimen and adjust as needed.  Labs today: CMP and CBC + urine ALB.  Return in 3 months. ?  ?  ? Relevant Orders  ? Bayer DCA Hb A1c Waived (Completed)  ? Microalbumin, Urine Waived (Completed)  ? Comprehensive metabolic panel  ? Paroxysmal A-fib (HCC)  ?  Chronic, sees cardiology.  Rate controlled.  Continue current medication regimen as prescribed by cardiology at this time and adjust as needed.  Recent cardiology note reviewed.  Continue collaboration.   ?  ?  ? Relevant Orders  ? Comprehensive metabolic panel  ? Lipid Panel w/o Chol/HDL Ratio  ? Senile purpura (HCC)  ?  Noted on exam, on anticoagulant therapy for a-fib.  Recommend gentle skin care at home and monitor for wounds, if present then immediately notify provider. ?  ?  ?  ? Endocrine  ? Hyperlipidemia associated with type 2 diabetes mellitus (HCC)  ?  Chronic, ongoing.  Continue with current regimen and adjust as needed based on labs.  Lipid panel today. ?  ?  ? Relevant Orders   ? Bayer DCA Hb A1c Waived (Completed)  ? Comprehensive metabolic panel  ? Lipid Panel w/o Chol/HDL Ratio  ? Hypothyroidism  ?  Chronic, ongoing.  Continue current medication regimen and adjust as needed based on labs.

## 2021-08-20 NOTE — Assessment & Plan Note (Signed)
Chronic, ongoing.  Continue current medication regimen and adjust as needed based on labs.  Thyroid labs obtained today. ?

## 2021-08-20 NOTE — Assessment & Plan Note (Signed)
Chronic, ongoing. BP at goal in office and at home (on lower side) -- recommend he trial cutting back on Amlodipine to every other day and continue to reduce if BP remains <130/90.  Recommend he monitor BP at least a few mornings a week at home and document.  DASH diet at home.  Continue remainder of current medication regimen and adjust as needed.  Labs today: CMP and CBC + urine ALB.  Return in 3 months. ?

## 2021-08-20 NOTE — Assessment & Plan Note (Signed)
Chronic, ongoing.  Continue to collaborate with nephrology as needed and renal dose medication, Lisinopril for kidney protection.  CMP and CBC today.  Urine Alb 30 today (March 2023). ?

## 2021-08-20 NOTE — Assessment & Plan Note (Signed)
Chronic, ongoing.  Continue with current regimen and adjust as needed based on labs.  Lipid panel today. 

## 2021-08-20 NOTE — Assessment & Plan Note (Signed)
Recheck CBC every 6 months (ordered today), no current symptoms.  Previously followed by Dr. Kizzie Bane.  CBC next visit -- June 2023 due. ?

## 2021-08-20 NOTE — Assessment & Plan Note (Signed)
BMI 28.94, has lost 10 pounds.  Recommended eating smaller high protein, low fat meals more frequently and exercising 30 mins a day 5 times a week with a goal of 10-15lb weight loss in the next 3 months. Patient voiced their understanding and motivation to adhere to these recommendations. ? ?

## 2021-08-20 NOTE — Assessment & Plan Note (Addendum)
Chronic, ongoing.  A1c 6.2% today, downward trend -- praised for this.  Urine Alb 30 today (March 2023).   ?- Continue Trulicity 4.5 MG weekly + continue Metformin ER 500 MG BID -- discussed with patient may benefit in future change to [place] and discontinuation Metformin due to kidney function.   Renal function limits medications that can be used + cost -- donut hole issues with Medicare.   ?- Recommend he check blood sugar twice a day, in morning fasting and 2 hours after a meal, document these.   ?- Collaborate with CCM team.   ?- Could consider a very low dose sulfonyurea if needed to keep cost low in future and avoid insulin.   ?- Return in 3 months. ?

## 2021-08-20 NOTE — Assessment & Plan Note (Signed)
Chronic, sees cardiology.  Rate controlled.  Continue current medication regimen as prescribed by cardiology at this time and adjust as needed.  Recent cardiology note reviewed.  Continue collaboration.   

## 2021-08-20 NOTE — Assessment & Plan Note (Signed)
Followed by cardiology, continue this collaboration. ?

## 2021-08-20 NOTE — Assessment & Plan Note (Signed)
Noted on exam, on anticoagulant therapy for a-fib.  Recommend gentle skin care at home and monitor for wounds, if present then immediately notify provider. 

## 2021-08-20 NOTE — Assessment & Plan Note (Signed)
Noted on past imaging, may benefit return to vascular in future.  Continue preventative medications. ?

## 2021-08-21 LAB — LIPID PANEL W/O CHOL/HDL RATIO
Cholesterol, Total: 141 mg/dL (ref 100–199)
HDL: 50 mg/dL (ref >39)
LDL Chol Calc (NIH): 64 mg/dL (ref 0–99)
Triglycerides: 158 mg/dL — ABNORMAL HIGH (ref 0–149)
VLDL Cholesterol Cal: 27 mg/dL (ref 5–40)

## 2021-08-21 LAB — COMPREHENSIVE METABOLIC PANEL
ALT: 9 IU/L (ref 0–44)
AST: 18 IU/L (ref 0–40)
Albumin/Globulin Ratio: 2.1 (ref 1.2–2.2)
Albumin: 4.7 g/dL (ref 3.7–4.7)
Alkaline Phosphatase: 95 IU/L (ref 44–121)
BUN/Creatinine Ratio: 13 (ref 10–24)
BUN: 22 mg/dL (ref 8–27)
Bilirubin Total: 0.6 mg/dL (ref 0.0–1.2)
CO2: 22 mmol/L (ref 20–29)
Calcium: 9.8 mg/dL (ref 8.6–10.2)
Chloride: 101 mmol/L (ref 96–106)
Creatinine, Ser: 1.65 mg/dL — ABNORMAL HIGH (ref 0.76–1.27)
Globulin, Total: 2.2 g/dL (ref 1.5–4.5)
Glucose: 128 mg/dL — ABNORMAL HIGH (ref 70–99)
Potassium: 4.9 mmol/L (ref 3.5–5.2)
Sodium: 138 mmol/L (ref 134–144)
Total Protein: 6.9 g/dL (ref 6.0–8.5)
eGFR: 43 mL/min/{1.73_m2} — ABNORMAL LOW (ref >59)

## 2021-08-21 LAB — THYROID PEROXIDASE ANTIBODY: Thyroperoxidase Ab SerPl-aCnc: 20 IU/mL (ref 0–34)

## 2021-08-21 LAB — TSH: TSH: 0.746 u[IU]/mL (ref 0.450–4.500)

## 2021-08-21 LAB — T4, FREE: Free T4: 1.47 ng/dL (ref 0.82–1.77)

## 2021-08-21 NOTE — Progress Notes (Signed)
Contacted via [place] ? ? ?Good evening Jonathan [name], your labs have returned: ?- Kidney function continues to show some ongoing kidney disease, this is remaining at baseline with no worsening.  Liver function, AST and ALT, is normal. ?- Cholesterol labs show LDL at goal.  Triglycerides a little elevated, ensure to get good fish intake weekly. ?- Thyroid labs remain stable, continue current Levothyroxine dosing.  Any questions? ?Keep being amazing!!  Thank you for allowing me to participate in your care.  I appreciate you. ?Kindest regards, ?Aulden Calise ?

## 2021-11-28 NOTE — Patient Instructions (Signed)

## 2021-11-30 ENCOUNTER — Ambulatory Visit (INDEPENDENT_AMBULATORY_CARE_PROVIDER_SITE_OTHER): Payer: Medicare Other | Admitting: Nurse Practitioner

## 2021-11-30 ENCOUNTER — Encounter: Payer: Self-pay | Admitting: Nurse Practitioner

## 2021-11-30 VITALS — BP 94/64 | HR 76 | Temp 97.6°F | Wt 206.4 lb

## 2021-11-30 DIAGNOSIS — E785 Hyperlipidemia, unspecified: Secondary | ICD-10-CM

## 2021-11-30 DIAGNOSIS — E1159 Type 2 diabetes mellitus with other circulatory complications: Secondary | ICD-10-CM

## 2021-11-30 DIAGNOSIS — D696 Thrombocytopenia, unspecified: Secondary | ICD-10-CM | POA: Diagnosis not present

## 2021-11-30 DIAGNOSIS — E1169 Type 2 diabetes mellitus with other specified complication: Secondary | ICD-10-CM

## 2021-11-30 DIAGNOSIS — E039 Hypothyroidism, unspecified: Secondary | ICD-10-CM

## 2021-11-30 DIAGNOSIS — E1122 Type 2 diabetes mellitus with diabetic chronic kidney disease: Secondary | ICD-10-CM | POA: Diagnosis not present

## 2021-11-30 DIAGNOSIS — N1832 Chronic kidney disease, stage 3b: Secondary | ICD-10-CM | POA: Diagnosis not present

## 2021-11-30 DIAGNOSIS — I480 Paroxysmal atrial fibrillation: Secondary | ICD-10-CM

## 2021-11-30 DIAGNOSIS — E6609 Other obesity due to excess calories: Secondary | ICD-10-CM

## 2021-11-30 DIAGNOSIS — E538 Deficiency of other specified B group vitamins: Secondary | ICD-10-CM

## 2021-11-30 DIAGNOSIS — D692 Other nonthrombocytopenic purpura: Secondary | ICD-10-CM | POA: Diagnosis not present

## 2021-11-30 DIAGNOSIS — I152 Hypertension secondary to endocrine disorders: Secondary | ICD-10-CM

## 2021-11-30 DIAGNOSIS — Z6830 Body mass index (BMI) 30.0-30.9, adult: Secondary | ICD-10-CM

## 2021-11-30 LAB — BAYER DCA HB A1C WAIVED: HB A1C (BAYER DCA - WAIVED): 6.7 % — ABNORMAL HIGH (ref 4.8–5.6)

## 2021-11-30 NOTE — Assessment & Plan Note (Signed)
Chronic, ongoing.  Continue to collaborate with nephrology as needed and renal dose medication, Lisinopril for kidney protection.  BMP and CBC today.  Urine Alb 30 (March 2023).

## 2021-11-30 NOTE — Assessment & Plan Note (Signed)
Noted on exam, on anticoagulant therapy for a-fib.  Recommend gentle skin care at home and monitor for wounds, if present then immediately notify provider.

## 2021-11-30 NOTE — Assessment & Plan Note (Signed)
Chronic, sees cardiology.  Rate controlled.  Continue current medication regimen as prescribed by cardiology at this time and adjust as needed.  Recent cardiology note reviewed.  Continue collaboration.   

## 2021-11-30 NOTE — Assessment & Plan Note (Signed)
Recheck CBC every 6 months (ordered today), no current symptoms.  Previously followed by Dr. Kizzie Bane.  CBC today for Q6MOS check.

## 2021-11-30 NOTE — Assessment & Plan Note (Signed)
BMI 30.22, some increase.  Recommended eating smaller high protein, low fat meals more frequently and exercising 30 mins a day 5 times a week with a goal of 10-15lb weight loss in the next 3 months. Patient voiced their understanding and motivation to adhere to these recommendations.

## 2021-11-30 NOTE — Assessment & Plan Note (Signed)
Chronic, ongoing. BP below goal in office today, but stable at home -- recommend he advise provider if consistent lows at home and we will adjust medications, possibly reduce Amlodipine to 2.5 MG daily.  Recommend he monitor BP at least a few mornings a week at home and document.  DASH diet at home.  Continue current medication regimen and adjust as needed.  Labs today: BMP and CBC.  Return in 3 months.

## 2021-11-30 NOTE — Assessment & Plan Note (Signed)
Chronic, ongoing.  Continue with current regimen and adjust as needed based on labs.  Lipid panel up to date.

## 2021-11-30 NOTE — Progress Notes (Signed)
BP 94/64 (BP Location: Left Arm, Patient Position: Sitting)   Pulse 76   Temp 97.6 F (36.4 C) (Oral)   Wt 206 lb 6.4 oz (93.6 kg)   SpO2 97%   BMI 30.22 kg/m    Subjective:    Patient ID: Jonathan [name], male    DOB: February 19, 1946, 76 y.o.   MRN: [id number]  HPI: Jonathan [name] is a 76 y.o. male  Chief Complaint  Patient presents with   Diabetes   Hyperlipidemia   Hypertension   Atrial Fibrillation   Chronic Kidney Disease   Hypothyroidism   DIABETES A1c 6.2% in March. Currently taking Trulicity 4.5 MG weekly and Metformin XR 500 MG BID.  Continues on B12 every other day.  Tried Invokana, but was discontinued in past due to kidney function. Hypoglycemic episodes:no Polydipsia/polyuria: no Visual disturbance: no Chest pain: no Paresthesias: no Glucose Monitoring: yes             Accucheck frequency: occasional             Fasting glucose:              Post prandial:              Evening:              Before meals: Taking Insulin?: no             Long acting insulin:             Short acting insulin: Blood Pressure Monitoring: daily Retinal Examination: Up to date  Foot Exam: Up to Date Pneumovax: Up to Date Influenza: Up to Date Aspirin: no   HYPERTENSION / HYPERLIPIDEMIA Continues on Amlodipine, Lisinopril, Metoprolol, Atorvastatin, Apixaban. Satisfied with current treatment? yes Duration of hypertension: chronic BP monitoring frequency: occasional BP range: 123/69 at home this morning BP medication side effects: no Duration of hyperlipidemia: chronic Cholesterol medication side effects: no Cholesterol supplements: none Medication compliance: good compliance Aspirin: no Recent stressors: no Recurrent headaches: no Visual changes: no Palpitations: no Dyspnea: no Chest pain: no Lower extremity edema: no Dizzy/lightheaded: no    CHRONIC KIDNEY DISEASE CKD status: stable Medications renally dose: yes Previous renal evaluation: yes Pneumovax:  Up  to Date Influenza Vaccine:  Up to Date   ATRIAL FIBRILLATION Last visit with Dr. Gwen Pounds last 08/13/21 -- continues Metoprolol, Eliquis, Flecainide. Continues on Atorvastatin for HLD.     Saw Dr. Maisie Fus from EP in 2021 about left atrial appendage closure device, but has opted not to do this at this time. Last EF was > 55% on 02/09/21. Atrial fibrillation status: controlled Satisfied with current treatment: stable Medication side effects:  no Medication compliance: good compliance Etiology of atrial fibrillation:  Palpitations:  no Chest pain:  no Dyspnea on exertion:  no Orthopnea:  no Syncope:  no Edema:  no Ventricular rate control: B-blocker Anti-coagulation: long acting   HYPOTHYROIDISM Continues Levothyroxine 125 MCG daily.   Thyroid control status:controlled Satisfied with current treatment? no Medication side effects: no Medication compliance: good compliance Etiology of hypothyroidism:  Recent dose adjustment:no Fatigue: no Cold intolerance: no Heat intolerance: no Weight gain: no Weight loss: no Constipation: no Diarrhea/loose stools: no Palpitations: no Lower extremity edema: no Anxiety/depressed mood: no   Relevant past medical, surgical, family and social history reviewed and updated as indicated. Interim medical history since our last visit reviewed. Allergies and medications reviewed and updated.  Review of Systems  Constitutional:  Negative for activity change, diaphoresis,  fatigue and fever.  Respiratory:  Negative for cough, chest tightness, shortness of breath and wheezing.   Cardiovascular:  Negative for chest pain, palpitations and leg swelling.  Gastrointestinal: Negative.   Endocrine: Negative for cold intolerance, heat intolerance, polydipsia, polyphagia and polyuria.  Neurological: Negative.   Psychiatric/Behavioral: Negative.      Per HPI unless specifically indicated above     Objective:    BP 94/64 (BP Location: Left Arm, Patient  Position: Sitting)   Pulse 76   Temp 97.6 F (36.4 C) (Oral)   Wt 206 lb 6.4 oz (93.6 kg)   SpO2 97%   BMI 30.22 kg/m   Wt Readings from Last 3 Encounters:  11/30/21 206 lb 6.4 oz (93.6 kg)  08/20/21 197 lb 9.6 oz (89.6 kg)  05/22/21 200 lb 3.2 oz (90.8 kg)    Physical Exam Vitals and nursing note reviewed.  Constitutional:      General: He is awake. He is not in acute distress.    Appearance: He is well-developed, well-groomed and overweight. He is not ill-appearing.  HENT:     Head: Normocephalic and atraumatic.     Right Ear: Hearing normal. No drainage.     Left Ear: Hearing normal. No drainage.  Eyes:     General: Lids are normal.        Right eye: No discharge.        Left eye: No discharge.     Conjunctiva/sclera: Conjunctivae normal.     Pupils: Pupils are equal, round, and reactive to light.  Neck:     Trachea: Trachea normal.  Cardiovascular:     Rate and Rhythm: Normal rate and regular rhythm.     Heart sounds: Normal heart sounds, S1 normal and S2 normal. No murmur heard.    No gallop.  Pulmonary:     Effort: Pulmonary effort is normal. No accessory muscle usage or respiratory distress.     Breath sounds: Normal breath sounds.  Abdominal:     General: Bowel sounds are normal.     Palpations: Abdomen is soft. There is no hepatomegaly or splenomegaly.  Musculoskeletal:        General: Normal range of motion.     Cervical back: Normal range of motion and neck supple.     Right lower leg: No edema.     Left lower leg: No edema.  Skin:    General: Skin is warm and dry.  Neurological:     Mental Status: He is alert and oriented to person, place, and time.  Psychiatric:        Attention and Perception: Attention normal.        Mood and Affect: Mood normal.        Speech: Speech normal.        Behavior: Behavior normal. Behavior is cooperative.        Thought Content: Thought content normal.    Results for orders placed or performed in visit on 11/30/21   Bayer DCA Hb A1c Waived  Result Value Ref Range   HB A1C (BAYER DCA - WAIVED) 6.7 (H) 4.8 - 5.6 %      Assessment & Plan:   Problem List Items Addressed This Visit       Cardiovascular and Mediastinum   Hypertension associated with diabetes ([place])    Chronic, ongoing. BP below goal in office today, but stable at home -- recommend he advise provider if consistent lows at home and we will adjust medications, possibly reduce  Amlodipine to 2.5 MG daily.  Recommend he monitor BP at least a few mornings a week at home and document.  DASH diet at home.  Continue current medication regimen and adjust as needed.  Labs today: BMP and CBC.  Return in 3 months.      Relevant Orders   Basic metabolic panel   Bayer DCA Hb A1c Waived (Completed)   Paroxysmal A-fib (HCC)    Chronic, sees cardiology.  Rate controlled.  Continue current medication regimen as prescribed by cardiology at this time and adjust as needed.  Recent cardiology note reviewed.  Continue collaboration.        Relevant Orders   Basic metabolic panel   CBC with Differential/Platelet   Senile purpura ([place])    Noted on exam, on anticoagulant therapy for a-fib.  Recommend gentle skin care at home and monitor for wounds, if present then immediately notify provider.      Relevant Orders   CBC with Differential/Platelet     Endocrine   Hyperlipidemia associated with type 2 diabetes mellitus (HCC)    Chronic, ongoing.  Continue with current regimen and adjust as needed based on labs.  Lipid panel up to date.      Relevant Orders   Bayer DCA Hb A1c Waived (Completed)   Hypothyroidism    Chronic, ongoing.  Continue current medication regimen and adjust as needed based on labs.  Thyroid labs up to date.      Type 2 diabetes mellitus with chronic kidney disease, without long-term current use of insulin (HCC) - Primary    Chronic, ongoing.  A1c 6.7% today, upward trend but under goal.  Urine Alb 30 (March 2023).   - Continue  Trulicity 4.5 MG weekly + continue Metformin ER 500 MG BID -- discussed with patient may benefit in future change to [facility] or Farxiga and discontinuation Metformin due to kidney function.   Renal function limits medications that can be used + cost -- donut hole issues with Medicare.   - Recommend he check blood sugar twice a day, in morning fasting and 2 hours after a meal, document these.   - Collaborate with CCM team.   - Could consider a very low dose sulfonyurea if needed to keep cost low in future and avoid insulin.   - Return in 3 months.      Relevant Orders   Basic metabolic panel   Bayer DCA Hb A1c Waived (Completed)     Genitourinary   CKD (chronic kidney disease), stage III (HCC)    Chronic, ongoing.  Continue to collaborate with nephrology as needed and renal dose medication, Lisinopril for kidney protection.  BMP and CBC today.  Urine Alb 30 (March 2023).      Relevant Orders   Basic metabolic panel   CBC with Differential/Platelet     Hematopoietic and Hemostatic   Thrombocytopenia (HCC)    Recheck CBC every 6 months (ordered today), no current symptoms.  Previously followed by Dr. Tish Men.  CBC today for Q6MOS check.      Relevant Orders   CBC with Differential/Platelet     Other   Obesity    BMI 30.22, some increase.  Recommended eating smaller high protein, low fat meals more frequently and exercising 30 mins a day 5 times a week with a goal of 10-15lb weight loss in the next 3 months. Patient voiced their understanding and motivation to adhere to these recommendations.       Vitamin B12 deficiency  Ongoing with chronic Metformin use, will recheck this level today and adjust supplement as needed.      Relevant Orders   CBC with Differential/Platelet   Vitamin B12     Follow up plan: Return in about 3 months (around 03/02/2022) for T2DM, HTN/HLD, A-FIB, THYROID.

## 2021-11-30 NOTE — Assessment & Plan Note (Signed)
Chronic, ongoing.   Continue current medication regimen and adjust as needed based on labs.  Thyroid labs up to date. 

## 2021-11-30 NOTE — Assessment & Plan Note (Signed)
Chronic, ongoing.  A1c 6.7% today, upward trend but under goal.  Urine Alb 30 (March 2023).   - Continue Trulicity 4.5 MG weekly + continue Metformin ER 500 MG BID -- discussed with patient may benefit in future change to [facility] or Farxiga and discontinuation Metformin due to kidney function.   Renal function limits medications that can be used + cost -- donut hole issues with Medicare.   - Recommend he check blood sugar twice a day, in morning fasting and 2 hours after a meal, document these.   - Collaborate with CCM team.   - Could consider a very low dose sulfonyurea if needed to keep cost low in future and avoid insulin.   - Return in 3 months.

## 2021-11-30 NOTE — Assessment & Plan Note (Signed)
Ongoing with chronic Metformin use, will recheck this level today and adjust supplement as needed. 

## 2021-12-01 ENCOUNTER — Other Ambulatory Visit: Payer: Self-pay | Admitting: Nurse Practitioner

## 2021-12-01 DIAGNOSIS — E875 Hyperkalemia: Secondary | ICD-10-CM

## 2021-12-01 LAB — CBC WITH DIFFERENTIAL/PLATELET
Basophils Absolute: 0 10*3/uL (ref 0.0–0.2)
Basos: 1 %
EOS (ABSOLUTE): 0.1 10*3/uL (ref 0.0–0.4)
Eos: 4 %
Hematocrit: 38.5 % (ref 37.5–51.0)
Hemoglobin: 13.2 g/dL (ref 13.0–17.7)
Immature Grans (Abs): 0 10*3/uL (ref 0.0–0.1)
Immature Granulocytes: 0 %
Lymphocytes Absolute: 1 10*3/uL (ref 0.7–3.1)
Lymphs: 27 %
MCH: 29.4 pg (ref 26.6–33.0)
MCHC: 34.3 g/dL (ref 31.5–35.7)
MCV: 86 fL (ref 79–97)
Monocytes Absolute: 0.4 10*3/uL (ref 0.1–0.9)
Monocytes: 11 %
Neutrophils Absolute: 2.2 10*3/uL (ref 1.4–7.0)
Neutrophils: 57 %
Platelets: 126 10*3/uL — ABNORMAL LOW (ref 150–450)
RBC: 4.49 x10E6/uL (ref 4.14–5.80)
RDW: 13.1 % (ref 11.6–15.4)
WBC: 3.7 10*3/uL (ref 3.4–10.8)

## 2021-12-01 LAB — BASIC METABOLIC PANEL
BUN/Creatinine Ratio: 16 (ref 10–24)
BUN: 30 mg/dL — ABNORMAL HIGH (ref 8–27)
CO2: 21 mmol/L (ref 20–29)
Calcium: 9.5 mg/dL (ref 8.6–10.2)
Chloride: 107 mmol/L — ABNORMAL HIGH (ref 96–106)
Creatinine, Ser: 1.84 mg/dL — ABNORMAL HIGH (ref 0.76–1.27)
Glucose: 134 mg/dL — ABNORMAL HIGH (ref 70–99)
Potassium: 5.6 mmol/L — ABNORMAL HIGH (ref 3.5–5.2)
Sodium: 141 mmol/L (ref 134–144)
eGFR: 38 mL/min/{1.73_m2} — ABNORMAL LOW (ref >59)

## 2021-12-01 LAB — VITAMIN B12: Vitamin B-12: 844 pg/mL (ref 232–1245)

## 2021-12-01 MED ORDER — LISINOPRIL 10 MG PO TABS: 10.0000 mg | ORAL_TABLET | Freq: Every day | ORAL | 3 refills | Status: DC

## 2021-12-01 NOTE — Addendum Note (Signed)
Addended by: Aura Dials T on: 12/01/2021 06:49 PM   Modules accepted: Orders

## 2021-12-01 NOTE — Progress Notes (Signed)
Contacted via MyChart   Good evening Jonathan [name], your labs have returned: - Kidney function continues to show baseline kidney disease, we will continue to monitor this closely and may have you return to nephrology in future. - Potassium level was a little elevated.  Since your blood pressure has been stable I am reducing your Lisinopril to 10 MG (you can take 1/2 a 20 MG tablet to complete bottle and then start new pill).  I will have you return in one week for lab check.  Also cut back on foods high in potassium, like bananas, dried fruit, mangoes, Raisin Bran. - CBC continues to show baseline levels with no worsening and B12 level stable.  Any questions?  Staff will call to schedule lab only visit next week with you.   Keep being excellent!!  Thank you for allowing me to participate in your care.  I appreciate you. Kindest regards, Preet Perrier

## 2021-12-01 NOTE — Progress Notes (Signed)
Needs lab visit in one week please

## 2021-12-09 ENCOUNTER — Other Ambulatory Visit: Payer: Medicare Other

## 2021-12-09 DIAGNOSIS — E875 Hyperkalemia: Secondary | ICD-10-CM | POA: Diagnosis not present

## 2021-12-10 ENCOUNTER — Other Ambulatory Visit: Payer: Self-pay | Admitting: Nurse Practitioner

## 2021-12-10 DIAGNOSIS — E875 Hyperkalemia: Secondary | ICD-10-CM

## 2021-12-10 LAB — BASIC METABOLIC PANEL
BUN/Creatinine Ratio: 11 (ref 10–24)
BUN: 17 mg/dL (ref 8–27)
CO2: 21 mmol/L (ref 20–29)
Calcium: 9.6 mg/dL (ref 8.6–10.2)
Chloride: 105 mmol/L (ref 96–106)
Creatinine, Ser: 1.58 mg/dL — ABNORMAL HIGH (ref 0.76–1.27)
Glucose: 158 mg/dL — ABNORMAL HIGH (ref 70–99)
Potassium: 5.5 mmol/L — ABNORMAL HIGH (ref 3.5–5.2)
Sodium: 140 mmol/L (ref 134–144)
eGFR: 45 mL/min/{1.73_m2} — ABNORMAL LOW (ref >59)

## 2021-12-10 MED ORDER — LISINOPRIL 2.5 MG PO TABS: 2.5000 mg | ORAL_TABLET | Freq: Every day | ORAL | 4 refills | Status: DC

## 2021-12-10 NOTE — Progress Notes (Signed)
Contacted via MyChart -- lab visit in 2 weeks needed please:)   Good morning Geovannie, your labs have returned and potassium remains mildly elevated.  Since your blood pressure has been so stable I am going to reduce your Lisinopril to 2.5 MG for kidney protection only and see if we will be able to keep it on board as it can cause elevation in potassium, especially in kidney disease at times.   Continue to focus on lowering potassium in diet -- no bananas, dried fruit, etc..  Will recheck in 2 weeks.  Any questions? Keep being amazing!!  Thank you for allowing me to participate in your care.  I appreciate you. Kindest regards, Yamil Dougher

## 2021-12-10 NOTE — Progress Notes (Signed)
Lisinopril reduction

## 2021-12-11 ENCOUNTER — Telehealth: Payer: Self-pay | Admitting: Nurse Practitioner

## 2021-12-11 NOTE — Telephone Encounter (Signed)
Spoke with patient and notify him of Jolene's recommendations. Patient verbalized understanding and has no further questions at this time.

## 2021-12-11 NOTE — Telephone Encounter (Signed)
Copied from CRM [id number]. Topic: General - Call Back - No Documentation >> Dec 11, 2021  3:56 PM Dondra Prader E wrote: Reason for CRM: Pt wants to know if he needs to start his 2.5 MG lisinopril immediately, he says you can respond via mychart

## 2021-12-15 ENCOUNTER — Other Ambulatory Visit: Payer: Self-pay | Admitting: Nurse Practitioner

## 2021-12-16 NOTE — Telephone Encounter (Signed)
Requested Prescriptions  Pending Prescriptions Disp Refills  . metFORMIN (GLUCOPHAGE-XR) 500 MG 24 hr tablet [Pharmacy Med Name: METFORMIN HCL ER 500 MG TABLET] 180 tablet 4    Sig: TAKE 1 TABLET BY MOUTH TWICE A DAY     Endocrinology:  Diabetes - Biguanides Failed - 12/15/2021  2:11 AM      Failed - Cr in normal range and within 360 days    Creatinine  Date Value Ref Range Status  11/15/2011 1.41 (H) 0.60 - 1.30 mg/dL Final   Creatinine, Ser  Date Value Ref Range Status  12/09/2021 1.58 (H) 0.76 - 1.27 mg/dL Final         Failed - eGFR in normal range and within 360 days    EGFR (African American)  Date Value Ref Range Status  11/15/2011 60 (L)  Final   GFR calc Af Amer  Date Value Ref Range Status  05/12/2020 55 (L) >59 mL/min/1.73 Final    Comment:    **In accordance with recommendations from the NKF-ASN Task force,**   [facility] is in the process of updating its eGFR calculation to the   2021 CKD-EPI creatinine equation that estimates kidney function   without a race variable.    EGFR (Non-African Amer.)  Date Value Ref Range Status  11/15/2011 52 (L)  Final    Comment:    eGFR values <31m/min/1.73 m2 may be an indication of chronic kidney disease (CKD). Calculated eGFR is useful in patients with stable renal function. The eGFR calculation will not be reliable in acutely ill patients when serum creatinine is changing rapidly. It is not useful in  patients on dialysis. The eGFR calculation may not be applicable to patients at the low and high extremes of body sizes, pregnant women, and vegetarians.    GFR calc non Af Amer  Date Value Ref Range Status  05/12/2020 47 (L) >59 mL/min/1.73 Final   eGFR  Date Value Ref Range Status  12/09/2021 45 (L) >59 mL/min/1.73 Final         Passed - HBA1C is between 0 and 7.9 and within 180 days    HB A1C (BAYER DCA - WAIVED)  Date Value Ref Range Status  11/30/2021 6.7 (H) 4.8 - 5.6 % Final    Comment:              Prediabetes: 5.7 - 6.4          Diabetes: >6.4          Glycemic control for adults with diabetes: <7.0          Passed - B12 Level in normal range and within 720 days    Vitamin B-12  Date Value Ref Range Status  11/30/2021 844 232 - 1,245 pg/mL Final         Passed - Valid encounter within last 6 months    Recent Outpatient Visits          2 weeks ago Type 2 diabetes mellitus with stage 3b chronic kidney disease, without long-term current use of insulin ([place]   3 months ago Type 2 diabetes mellitus with stage 3b chronic kidney disease, without long-term current use of insulin [place]   6 months ago Type 2 diabetes mellitus with stage 3b chronic kidney disease, without long-term current use of insulin ([place]   10 months ago  Type 2 diabetes mellitus with stage 3b chronic kidney disease, without long-term current use of insulin ([place])   [place], [place] T, NP   1 year ago Type 2 diabetes mellitus with stage 3b chronic kidney disease, without long-term current use of insulin ([place])   [place], [place] T, NP      Future Appointments            In 2 months Cannady, Lolo T, NP [employer]           Passed - CBC within normal limits and completed in the last 12 months    WBC  Date Value Ref Range Status  11/30/2021 3.7 3.4 - 10.8 x10E3/uL Final  08/13/2016 5.2 3.8 - 10.6 K/uL Final   RBC  Date Value Ref Range Status  11/30/2021 4.49 4.14 - 5.80 x10E6/uL Final  08/13/2016 5.09 4.40 - 5.90 MIL/uL Final   Hemoglobin  Date Value Ref Range Status  11/30/2021 13.2 13.0 - 17.7 g/dL Final   Hematocrit  Date Value Ref Range Status  11/30/2021 38.5 37.5 - 51.0 % Final   MCHC  Date Value Ref Range Status  11/30/2021 34.3 31.5 - 35.7 g/dL Final  08/13/2016 34.2 32.0 - 36.0  g/dL Final   [facility]  Date Value Ref Range Status  11/30/2021 29.4 26.6 - 33.0 pg Final  08/13/2016 28.5 26.0 - 34.0 pg Final   MCV  Date Value Ref Range Status  11/30/2021 86 79 - 97 fL Final  12/07/2013 81 80 - 100 fL Final   No results found for: "PLTCOUNTKUC", "LABPLAT", "POCPLA" RDW  Date Value Ref Range Status  11/30/2021 13.1 11.6 - 15.4 % Final  12/07/2013 16.6 (H) 11.5 - 14.5 % Final

## 2021-12-28 ENCOUNTER — Other Ambulatory Visit: Payer: Medicare Other

## 2021-12-28 DIAGNOSIS — E875 Hyperkalemia: Secondary | ICD-10-CM | POA: Diagnosis not present

## 2021-12-29 LAB — BASIC METABOLIC PANEL
BUN/Creatinine Ratio: 17 (ref 10–24)
BUN: 34 mg/dL — ABNORMAL HIGH (ref 8–27)
CO2: 23 mmol/L (ref 20–29)
Calcium: 9.5 mg/dL (ref 8.6–10.2)
Chloride: 102 mmol/L (ref 96–106)
Creatinine, Ser: 2 mg/dL — ABNORMAL HIGH (ref 0.76–1.27)
Glucose: 160 mg/dL — ABNORMAL HIGH (ref 70–99)
Potassium: 5.1 mmol/L (ref 3.5–5.2)
Sodium: 138 mmol/L (ref 134–144)
eGFR: 34 mL/min/{1.73_m2} — ABNORMAL LOW (ref >59)

## 2021-12-29 NOTE — Progress Notes (Signed)
Contacted via MyChart   Good afternoon Jonathan [name], your labs have returned.  Overall, kidney function continues to show chronic kidney disease stage 3b, which we will continue to monitor closely as a little lower this check.  Potassium level normal this check.  Great news!! Keep being stellar!!  Thank you for allowing me to participate in your care.  I appreciate you. Kindest regards, Minoru Chap

## 2022-02-18 DIAGNOSIS — I480 Paroxysmal atrial fibrillation: Secondary | ICD-10-CM | POA: Diagnosis not present

## 2022-02-18 DIAGNOSIS — Z9889 Other specified postprocedural states: Secondary | ICD-10-CM | POA: Diagnosis not present

## 2022-02-18 DIAGNOSIS — Z8679 Personal history of other diseases of the circulatory system: Secondary | ICD-10-CM | POA: Diagnosis not present

## 2022-02-18 DIAGNOSIS — I6523 Occlusion and stenosis of bilateral carotid arteries: Secondary | ICD-10-CM | POA: Diagnosis not present

## 2022-02-18 DIAGNOSIS — E782 Mixed hyperlipidemia: Secondary | ICD-10-CM | POA: Diagnosis not present

## 2022-02-18 DIAGNOSIS — I10 Essential (primary) hypertension: Secondary | ICD-10-CM | POA: Diagnosis not present

## 2022-02-28 NOTE — Patient Instructions (Signed)

## 2022-03-02 ENCOUNTER — Encounter: Payer: Self-pay | Admitting: Nurse Practitioner

## 2022-03-02 ENCOUNTER — Ambulatory Visit (INDEPENDENT_AMBULATORY_CARE_PROVIDER_SITE_OTHER): Payer: Medicare Other | Admitting: Nurse Practitioner

## 2022-03-02 VITALS — BP 108/64 | HR 72 | Temp 97.6°F | Wt 209.4 lb

## 2022-03-02 DIAGNOSIS — I152 Hypertension secondary to endocrine disorders: Secondary | ICD-10-CM

## 2022-03-02 DIAGNOSIS — Z23 Encounter for immunization: Secondary | ICD-10-CM

## 2022-03-02 DIAGNOSIS — E039 Hypothyroidism, unspecified: Secondary | ICD-10-CM | POA: Diagnosis not present

## 2022-03-02 DIAGNOSIS — E1169 Type 2 diabetes mellitus with other specified complication: Secondary | ICD-10-CM | POA: Diagnosis not present

## 2022-03-02 DIAGNOSIS — D692 Other nonthrombocytopenic purpura: Secondary | ICD-10-CM

## 2022-03-02 DIAGNOSIS — I480 Paroxysmal atrial fibrillation: Secondary | ICD-10-CM

## 2022-03-02 DIAGNOSIS — Z6830 Body mass index (BMI) 30.0-30.9, adult: Secondary | ICD-10-CM

## 2022-03-02 DIAGNOSIS — I6523 Occlusion and stenosis of bilateral carotid arteries: Secondary | ICD-10-CM

## 2022-03-02 DIAGNOSIS — E785 Hyperlipidemia, unspecified: Secondary | ICD-10-CM

## 2022-03-02 DIAGNOSIS — E6609 Other obesity due to excess calories: Secondary | ICD-10-CM | POA: Diagnosis not present

## 2022-03-02 DIAGNOSIS — E1122 Type 2 diabetes mellitus with diabetic chronic kidney disease: Secondary | ICD-10-CM

## 2022-03-02 DIAGNOSIS — E1159 Type 2 diabetes mellitus with other circulatory complications: Secondary | ICD-10-CM | POA: Diagnosis not present

## 2022-03-02 DIAGNOSIS — N1832 Chronic kidney disease, stage 3b: Secondary | ICD-10-CM | POA: Diagnosis not present

## 2022-03-02 LAB — BAYER DCA HB A1C WAIVED: HB A1C (BAYER DCA - WAIVED): 6.7 % — ABNORMAL HIGH (ref 4.8–5.6)

## 2022-03-02 LAB — HM DIABETES EYE EXAM

## 2022-03-02 MED ORDER — ATORVASTATIN CALCIUM 80 MG PO TABS: 80.0000 mg | ORAL_TABLET | Freq: Every day | ORAL | 4 refills | Status: DC

## 2022-03-02 MED ORDER — LEVOTHYROXINE SODIUM 125 MCG PO TABS: 125.0000 ug | ORAL_TABLET | Freq: Every day | ORAL | 4 refills | Status: DC

## 2022-03-02 MED ORDER — TRULICITY 4.5 MG/0.5ML ~~LOC~~ SOAJ: 4.5000 mg | SUBCUTANEOUS | 4 refills | Status: DC

## 2022-03-02 NOTE — Progress Notes (Signed)
BP 108/64 (BP Location: Left Arm, Patient Position: Sitting, Cuff Size: Normal)   Pulse 72   Temp 97.6 F (36.4 C) (Oral)   Wt 209 lb 6.4 oz (95 kg)   SpO2 98%   BMI 30.66 kg/m    Subjective:    Patient ID: Jonathan [name], male    DOB: 1946-01-17, 76 y.o.   MRN: [id number]  HPI: Jonathan [name] is a 76 y.o. male  Chief Complaint  Patient presents with   Diabetes    Patient most recent Diabetic Eye Exam requested at today's visit.    Hyperlipidemia   Hypertension   Atrial Fibrillation   Hypothyroidism   DIABETES A1c last visit 6.7%.  Taking Trulicity 4.5 MG weekly and Metformin XR 500 MG BID.  Tried Invokana, but was discontinued in past due to kidney function.  Is working on diet changes and walking 2 miles every other day -- sometimes every day. Hypoglycemic episodes:no Polydipsia/polyuria: no Visual disturbance: no Chest pain: no Paresthesias: no Glucose Monitoring: yes             Accucheck frequency: occasional             Fasting glucose: 130-140 range             Post prandial:              Evening:              Before meals: Taking Insulin?: no             Long acting insulin:             Short acting insulin: Blood Pressure Monitoring: daily Retinal Examination: Not Up to date -- due in April 2024 (every other year) Foot Exam: Up to Date Pneumovax: Up to Date Influenza: Up to Date Aspirin: no   HYPERTENSION / HYPERLIPIDEMIA Continues on Amlodipine, Lisinopril, Metoprolol, Atorvastatin, Apixaban. Satisfied with current treatment? yes Duration of hypertension: chronic BP monitoring frequency: every day BP range: 116/68 at home this morning BP medication side effects: no Duration of hyperlipidemia: chronic Cholesterol medication side effects: no Cholesterol supplements: none Medication compliance: good compliance Aspirin: no Recent stressors: no Recurrent headaches: no Visual changes: no Palpitations: no Dyspnea: no Chest pain: no Lower  extremity edema: no Dizzy/lightheaded: no    CHRONIC KIDNEY DISEASE CKD status: stable Medications renally dose: yes Previous renal evaluation: yes Pneumovax:  Up to Date Influenza Vaccine:  Up to Date   ATRIAL FIBRILLATION Last visit with Dr. Nehemiah Massed 02/18/22 -- continues Metoprolol, Eliquis, Flecainide. Continues on Atorvastatin for HLD.     Saw Dr. Marcello Moores from [facility] in 2021 about left atrial appendage closure device, but has opted not to do this. Last EF was > 55% on 02/09/21.  Last carotid doppler and stress test was on 02/09/21. Atrial fibrillation status: controlled Satisfied with current treatment: stable Medication side effects:  no Medication compliance: good compliance Etiology of atrial fibrillation:  Palpitations:  no Chest pain:  no Dyspnea on exertion:  no Orthopnea:  no Syncope:  no Edema:  no Ventricular rate control: B-blocker Anti-coagulation: long acting   HYPOTHYROIDISM Stable on Levothyroxine 125 MCG daily.   Thyroid control status:controlled Satisfied with current treatment? no Medication side effects: no Medication compliance: good compliance Etiology of hypothyroidism:  Recent dose adjustment:no Fatigue: no Cold intolerance: no Heat intolerance: no Weight gain: no Weight loss: no Constipation: no Diarrhea/loose stools: no Palpitations: no Lower extremity edema: no Anxiety/depressed mood: no  Relevant past medical, surgical, family and social history reviewed and updated as indicated. Interim medical history since our last visit reviewed. Allergies and medications reviewed and updated.  Review of Systems  Constitutional:  Negative for activity change, diaphoresis, fatigue and fever.  Respiratory:  Negative for cough, chest tightness, shortness of breath and wheezing.   Cardiovascular:  Negative for chest pain, palpitations and leg swelling.  Gastrointestinal: Negative.   Endocrine: Negative for cold intolerance, heat intolerance, polydipsia,  polyphagia and polyuria.  Neurological: Negative.   Psychiatric/Behavioral: Negative.      Per HPI unless specifically indicated above     Objective:    BP 108/64 (BP Location: Left Arm, Patient Position: Sitting, Cuff Size: Normal)   Pulse 72   Temp 97.6 F (36.4 C) (Oral)   Wt 209 lb 6.4 oz (95 kg)   SpO2 98%   BMI 30.66 kg/m   Wt Readings from Last 3 Encounters:  03/02/22 209 lb 6.4 oz (95 kg)  11/30/21 206 lb 6.4 oz (93.6 kg)  08/20/21 197 lb 9.6 oz (89.6 kg)    Physical Exam Vitals and nursing note reviewed.  Constitutional:      General: He is awake. He is not in acute distress.    Appearance: He is well-developed, well-groomed and overweight. He is not ill-appearing.  HENT:     Head: Normocephalic and atraumatic.     Right Ear: Hearing normal. No drainage.     Left Ear: Hearing normal. No drainage.  Eyes:     General: Lids are normal.        Right eye: No discharge.        Left eye: No discharge.     Conjunctiva/sclera: Conjunctivae normal.     Pupils: Pupils are equal, round, and reactive to light.  Neck:     Trachea: Trachea normal.  Cardiovascular:     Rate and Rhythm: Normal rate and regular rhythm.     Heart sounds: Normal heart sounds, S1 normal and S2 normal. No murmur heard.    No gallop.  Pulmonary:     Effort: Pulmonary effort is normal. No accessory muscle usage or respiratory distress.     Breath sounds: Normal breath sounds.  Abdominal:     General: Bowel sounds are normal.     Palpations: Abdomen is soft. There is no hepatomegaly or splenomegaly.  Musculoskeletal:        General: Normal range of motion.     Cervical back: Normal range of motion and neck supple.     Right lower leg: No edema.     Left lower leg: No edema.  Skin:    General: Skin is warm and dry.  Neurological:     Mental Status: He is alert and oriented to person, place, and time.  Psychiatric:        Attention and Perception: Attention normal.        Mood and Affect:  Mood normal.        Speech: Speech normal.        Behavior: Behavior normal. Behavior is cooperative.        Thought Content: Thought content normal.    Diabetic Foot Exam - Simple   Simple Foot Form Visual Inspection See comments: Yes Sensation Testing Intact to touch and monofilament testing bilaterally: Yes Pulse Check Posterior Tibialis and Dorsalis pulse intact bilaterally: Yes Comments Bunion to bilateral feet.  Skin intact.       Results for orders placed or performed in visit  on 53/29/92  Basic Metabolic Panel (BMET)  Result Value Ref Range   Glucose 160 (H) 70 - 99 mg/dL   BUN 34 (H) 8 - 27 mg/dL   Creatinine, Ser 2.00 (H) 0.76 - 1.27 mg/dL   eGFR 34 (L) >59 mL/min/1.73   BUN/Creatinine Ratio 17 10 - 24   Sodium 138 134 - 144 mmol/L   Potassium 5.1 3.5 - 5.2 mmol/L   Chloride 102 96 - 106 mmol/L   CO2 23 20 - 29 mmol/L   Calcium 9.5 8.6 - 10.2 mg/dL      Assessment & Plan:   Problem List Items Addressed This Visit       Cardiovascular and Mediastinum   Bilateral carotid artery stenosis    Chronic, stable.  Noted on past imaging, may benefit return to vascular in future.  Continue preventative medications.  Last imaging with cardiology September 2022, stable.      Relevant Medications   atorvastatin (LIPITOR) 80 MG tablet   Hypertension associated with diabetes (HCC)    Chronic, stable. BP below goal in office today.  Recommend he monitor BP at least a few mornings a week at home and document.  DASH diet at home.  Continue current medication regimen and adjust as needed.  Labs today: BMP.  Return in 3 months.      Relevant Medications   atorvastatin (LIPITOR) 80 MG tablet   Dulaglutide (TRULICITY) 4.5 EQ/6.8TM SOPN   Other Relevant Orders   Bayer DCA Hb A1c Waived   Basic metabolic panel   Paroxysmal A-fib (HCC)    Chronic, sees cardiology.  Rate controlled.  Continue current medication regimen as prescribed by cardiology at this time and adjust as  needed.  Recent cardiology note reviewed.  Continue collaboration.        Relevant Medications   atorvastatin (LIPITOR) 80 MG tablet   Other Relevant Orders   Basic metabolic panel     Endocrine   Hyperlipidemia associated with type 2 diabetes mellitus (HCC)    Chronic, ongoing.  Continue with current regimen and adjust as needed based on labs.  Lipid panel today.      Relevant Medications   atorvastatin (LIPITOR) 80 MG tablet   Dulaglutide (TRULICITY) 4.5 HD/6.2IW SOPN   Other Relevant Orders   Bayer DCA Hb A1c Waived   Lipid Panel w/o Chol/HDL Ratio   Hypothyroidism    Chronic, ongoing.  Continue current medication regimen and adjust as needed based on labs.  Thyroid labs up to date.      Relevant Medications   levothyroxine (SYNTHROID) 125 MCG tablet   Type 2 diabetes mellitus with chronic kidney disease, without long-term current use of insulin (HCC) - Primary    Chronic, ongoing.  A1c 6.7% today, remaining stable.  Urine Alb 30 (March 2023).   - Continue Trulicity 4.5 MG weekly + continue Metformin ER 500 MG BID -- discussed with patient may benefit in future change to [facility] or Farxiga and discontinuation Metformin due to kidney function.   Renal function limits medications that can be used + cost -- donut hole issues with Medicare.   - Recommend he check blood sugar twice a day, in morning fasting and 2 hours after a meal, document these.   - Collaborate with CCM team.   - Could consider a very low dose sulfonyurea if needed to keep cost low in future and avoid insulin.   - Return in 3 months.      Relevant Medications  atorvastatin (LIPITOR) 80 MG tablet   Dulaglutide (TRULICITY) 4.5 BB/4.0ZJ SOPN   Other Relevant Orders   Bayer DCA Hb A1c Waived   Basic metabolic panel     Genitourinary   CKD (chronic kidney disease), stage III (HCC)    Chronic, ongoing.  Continue to collaborate with nephrology as needed and renal dose medication, Lisinopril for kidney  protection.  BMP today.  Urine Alb 30 (March 2023).      Relevant Orders   Bayer DCA Hb A1c Waived   Basic metabolic panel     Other   Obesity    BMI 30.66.  Recommended eating smaller high protein, low fat meals more frequently and exercising 30 mins a day 5 times a week with a goal of 10-15lb weight loss in the next 3 months. Patient voiced their understanding and motivation to adhere to these recommendations.       Relevant Medications   Dulaglutide (TRULICITY) 4.5 QD/6.4RC SOPN   Other Visit Diagnoses     Flu vaccine need       Flu vaccine in office today.   Relevant Orders   Flu Vaccine QUAD High Dose(Fluad) (Completed)        Follow up plan: Return in about 3 months (around 06/02/2022) for T2DM, HTN/HLD, THYROID, A-FIB, CKD.

## 2022-03-02 NOTE — Assessment & Plan Note (Signed)
Chronic, ongoing.  A1c 6.7% today, remaining stable.  Urine Alb 30 (March 2023).   - Continue Trulicity 4.5 MG weekly + continue Metformin ER 500 MG BID -- discussed with patient may benefit in future change to [facility] or Farxiga and discontinuation Metformin due to kidney function.   Renal function limits medications that can be used + cost -- donut hole issues with Medicare.   - Recommend he check blood sugar twice a day, in morning fasting and 2 hours after a meal, document these.   - Collaborate with CCM team.   - Could consider a very low dose sulfonyurea if needed to keep cost low in future and avoid insulin.   - Return in 3 months.

## 2022-03-02 NOTE — Assessment & Plan Note (Signed)
Chronic, ongoing.   Continue current medication regimen and adjust as needed based on labs.  Thyroid labs up to date. 

## 2022-03-02 NOTE — Assessment & Plan Note (Signed)
Chronic, stable. BP below goal in office today.  Recommend he monitor BP at least a few mornings a week at home and document.  DASH diet at home.  Continue current medication regimen and adjust as needed.  Labs today: BMP.  Return in 3 months.

## 2022-03-02 NOTE — Assessment & Plan Note (Signed)
Chronic, ongoing.  Continue to collaborate with nephrology as needed and renal dose medication, Lisinopril for kidney protection.  BMP today.  Urine Alb 30 (March 2023).

## 2022-03-02 NOTE — Assessment & Plan Note (Signed)
Chronic, sees cardiology.  Rate controlled.  Continue current medication regimen as prescribed by cardiology at this time and adjust as needed.  Recent cardiology note reviewed.  Continue collaboration.

## 2022-03-02 NOTE — Assessment & Plan Note (Signed)
Chronic, ongoing.  Continue with current regimen and adjust as needed based on labs.  Lipid panel today. 

## 2022-03-02 NOTE — Assessment & Plan Note (Signed)
Chronic, stable.  Noted on past imaging, may benefit return to vascular in future.  Continue preventative medications.  Last imaging with cardiology September 2022, stable.

## 2022-03-02 NOTE — Assessment & Plan Note (Signed)
BMI 30.66.  Recommended eating smaller high protein, low fat meals more frequently and exercising 30 mins a day 5 times a week with a goal of 10-15lb weight loss in the next 3 months. Patient voiced their understanding and motivation to adhere to these recommendations.  

## 2022-03-03 LAB — BASIC METABOLIC PANEL
BUN/Creatinine Ratio: 15 (ref 10–24)
BUN: 25 mg/dL (ref 8–27)
CO2: 23 mmol/L (ref 20–29)
Calcium: 9.6 mg/dL (ref 8.6–10.2)
Chloride: 99 mmol/L (ref 96–106)
Creatinine, Ser: 1.65 mg/dL — ABNORMAL HIGH (ref 0.76–1.27)
Glucose: 134 mg/dL — ABNORMAL HIGH (ref 70–99)
Potassium: 5 mmol/L (ref 3.5–5.2)
Sodium: 137 mmol/L (ref 134–144)
eGFR: 43 mL/min/{1.73_m2} — ABNORMAL LOW (ref >59)

## 2022-03-03 LAB — LIPID PANEL W/O CHOL/HDL RATIO
Cholesterol, Total: 147 mg/dL (ref 100–199)
HDL: 52 mg/dL (ref >39)
LDL Chol Calc (NIH): 70 mg/dL (ref 0–99)
Triglycerides: 147 mg/dL (ref 0–149)
VLDL Cholesterol Cal: 25 mg/dL (ref 5–40)

## 2022-03-03 NOTE — Progress Notes (Signed)
Contacted via [place] afternoon [place], your labs have returned: - Kidney function, creatinine and eGFR, continues to show CKD 3b with no worsening.  We will continue to keep a close eye on this at visits and adjust medications as needed. - Cholesterol levels look great.  No changes needed.  Keep up the good work!!  Any questions? Keep being amazing!!  Thank you for allowing me to participate in your care.  I appreciate you. Kindest regards, Shiv Shuey

## 2022-05-31 NOTE — Patient Instructions (Signed)
Diabetes Mellitus Basics  Diabetes mellitus, or diabetes, is a long-term (chronic) disease. It occurs when the body does not properly use sugar (glucose) that is released from food after you eat. Diabetes mellitus may be caused by one or both of these problems: Your pancreas does not make enough of a hormone called insulin. Your body does not react in a normal way to the insulin that it makes. Insulin lets glucose enter cells in your body. This gives you energy. If you have diabetes, glucose cannot get into cells. This causes high blood glucose (hyperglycemia). How to treat and manage diabetes You may need to take insulin or other diabetes medicines daily to keep your glucose in balance. If you are prescribed insulin, you will learn how to give yourself insulin by injection. You may need to adjust the amount of insulin you take based on the foods that you eat. You will need to check your blood glucose levels using a glucose monitor as told by your health care provider. The readings can help determine if you have low or high blood glucose. Generally, you should have these blood glucose levels: Before meals (preprandial): 80-130 mg/dL (4.4-7.2 mmol/L). After meals (postprandial): below 180 mg/dL (10 mmol/L). Hemoglobin A1c (HbA1c) level: less than 7%. Your health care provider will set treatment goals for you. Keep all follow-up visits. This is important. Follow these instructions at home: Diabetes medicines Take your diabetes medicines every day as told by your health care provider. List your diabetes medicines here: Name of medicine: ______________________________ Amount (dose): _______________ Time (a.m./p.m.): _______________ Notes: ___________________________________ Name of medicine: ______________________________ Amount (dose): _______________ Time (a.m./p.m.): _______________ Notes: ___________________________________ Name of medicine: ______________________________ Amount (dose):  _______________ Time (a.m./p.m.): _______________ Notes: ___________________________________ Insulin If you use insulin, list the types of insulin you use here: Insulin type: ______________________________ Amount (dose): _______________ Time (a.m./p.m.): _______________Notes: ___________________________________ Insulin type: ______________________________ Amount (dose): _______________ Time (a.m./p.m.): _______________ Notes: ___________________________________ Insulin type: ______________________________ Amount (dose): _______________ Time (a.m./p.m.): _______________ Notes: ___________________________________ Insulin type: ______________________________ Amount (dose): _______________ Time (a.m./p.m.): _______________ Notes: ___________________________________ Insulin type: ______________________________ Amount (dose): _______________ Time (a.m./p.m.): _______________ Notes: ___________________________________ Managing blood glucose  Check your blood glucose levels using a glucose monitor as told by your health care provider. Write down the times that you check your glucose levels here: Time: _______________ Notes: ___________________________________ Time: _______________ Notes: ___________________________________ Time: _______________ Notes: ___________________________________ Time: _______________ Notes: ___________________________________ Time: _______________ Notes: ___________________________________ Time: _______________ Notes: ___________________________________  Low blood glucose Low blood glucose (hypoglycemia) is when glucose is at or below 70 mg/dL (3.9 mmol/L). Symptoms may include: Feeling: Hungry. Sweaty and clammy. Irritable or easily upset. Dizzy. Sleepy. Having: A fast heartbeat. A headache. A change in your vision. Numbness around the mouth, lips, or tongue. Having trouble with: Moving (coordination). Sleeping. Treating low blood glucose To treat low blood  glucose, eat or drink something containing sugar right away. If you can think clearly and swallow safely, follow the 15:15 rule: Take 15 grams of a fast-acting carb (carbohydrate), as told by your health care provider. Some fast-acting carbs are: Glucose tablets: take 3-4 tablets. Hard candy: eat 3-5 pieces. Fruit juice: drink 4 oz (120 mL). Regular (not diet) soda: drink 4-6 oz (120-180 mL). Honey or sugar: eat 1 Tbsp (15 mL). Check your blood glucose levels 15 minutes after you take the carb. If your glucose is still at or below 70 mg/dL (3.9 mmol/L), take 15 grams of a carb again. If your glucose does not go above 70 mg/dL (3.9 mmol/L) after   3 tries, get help right away. After your glucose goes back to normal, eat a meal or a snack within 1 hour. Treating very low blood glucose If your glucose is at or below 54 mg/dL (3 mmol/L), you have very low blood glucose (severe hypoglycemia). This is an emergency. Do not wait to see if the symptoms will go away. Get medical help right away. Call your local emergency services (911 in the [place] Do not drive yourself to the hospital. Questions to ask your health care provider Should I talk with a diabetes educator? What equipment will I need to care for myself at home? What diabetes medicines do I need? When should I take them? How often do I need to check my blood glucose levels? What number can I call if I have questions? When is my follow-up visit? Where can I find a support group for people with diabetes? Where to find more information American Diabetes Association: www.diabetes.org Association of Diabetes Care and Education Specialists: www.diabeteseducator.org Contact a health care provider if: Your blood glucose is at or above 240 mg/dL (13.3 mmol/L) for 2 days in a row. You have been sick or have had a fever for 2 days or more, and you are not getting better. You have any of these problems for more than 6 hours: You cannot eat or  drink. You feel nauseous. You vomit. You have diarrhea. Get help right away if: Your blood glucose is lower than 54 mg/dL (3 mmol/L). You get confused. You have trouble thinking clearly. You have trouble breathing. These symptoms may represent a serious problem that is an emergency. Do not wait to see if the symptoms will go away. Get medical help right away. Call your local emergency services (911 in the [place] Do not drive yourself to the hospital. Summary Diabetes mellitus is a chronic disease that occurs when the body does not properly use sugar (glucose) that is released from food after you eat. Take insulin and diabetes medicines as told. Check your blood glucose every day, as often as told. Keep all follow-up visits. This is important. This information is not intended to replace advice given to you by your health care provider. Make sure you discuss any questions you have with your health care provider. Document Revised: 09/11/2019 Document Reviewed: 09/11/2019 Elsevier Patient Education  2023 [employer]  

## 2022-06-02 ENCOUNTER — Ambulatory Visit (INDEPENDENT_AMBULATORY_CARE_PROVIDER_SITE_OTHER): Payer: Medicare Other | Admitting: Nurse Practitioner

## 2022-06-02 ENCOUNTER — Encounter: Payer: Self-pay | Admitting: Nurse Practitioner

## 2022-06-02 VITALS — BP 124/77 | HR 76 | Temp 97.5°F | Ht 69.29 in | Wt 207.0 lb

## 2022-06-02 DIAGNOSIS — I480 Paroxysmal atrial fibrillation: Secondary | ICD-10-CM

## 2022-06-02 DIAGNOSIS — E538 Deficiency of other specified B group vitamins: Secondary | ICD-10-CM | POA: Diagnosis not present

## 2022-06-02 DIAGNOSIS — E1159 Type 2 diabetes mellitus with other circulatory complications: Secondary | ICD-10-CM

## 2022-06-02 DIAGNOSIS — D696 Thrombocytopenia, unspecified: Secondary | ICD-10-CM | POA: Diagnosis not present

## 2022-06-02 DIAGNOSIS — Z7984 Long term (current) use of oral hypoglycemic drugs: Secondary | ICD-10-CM

## 2022-06-02 DIAGNOSIS — N1832 Chronic kidney disease, stage 3b: Secondary | ICD-10-CM | POA: Diagnosis not present

## 2022-06-02 DIAGNOSIS — E1122 Type 2 diabetes mellitus with diabetic chronic kidney disease: Secondary | ICD-10-CM

## 2022-06-02 DIAGNOSIS — D692 Other nonthrombocytopenic purpura: Secondary | ICD-10-CM | POA: Diagnosis not present

## 2022-06-02 DIAGNOSIS — E6609 Other obesity due to excess calories: Secondary | ICD-10-CM

## 2022-06-02 DIAGNOSIS — I152 Hypertension secondary to endocrine disorders: Secondary | ICD-10-CM

## 2022-06-02 DIAGNOSIS — Z0000 Encounter for general adult medical examination without abnormal findings: Secondary | ICD-10-CM | POA: Diagnosis not present

## 2022-06-02 DIAGNOSIS — Z6830 Body mass index (BMI) 30.0-30.9, adult: Secondary | ICD-10-CM

## 2022-06-02 DIAGNOSIS — E039 Hypothyroidism, unspecified: Secondary | ICD-10-CM

## 2022-06-02 DIAGNOSIS — E785 Hyperlipidemia, unspecified: Secondary | ICD-10-CM

## 2022-06-02 DIAGNOSIS — Z7985 Long-term (current) use of injectable non-insulin antidiabetic drugs: Secondary | ICD-10-CM | POA: Diagnosis not present

## 2022-06-02 DIAGNOSIS — E1169 Type 2 diabetes mellitus with other specified complication: Secondary | ICD-10-CM

## 2022-06-02 LAB — BAYER DCA HB A1C WAIVED: HB A1C (BAYER DCA - WAIVED): 8 % — ABNORMAL HIGH (ref 4.8–5.6)

## 2022-06-02 LAB — MICROALBUMIN, URINE WAIVED
Creatinine, Urine Waived: 200 mg/dL (ref 10–300)
Microalb, Ur Waived: 30 mg/L — ABNORMAL HIGH (ref 0–19)
Microalb/Creat Ratio: <30 mg/g (ref <30)

## 2022-06-02 MED ORDER — METFORMIN HCL ER 500 MG PO TB24: 500.0000 mg | ORAL_TABLET | Freq: Two times a day (BID) | ORAL | 4 refills | Status: DC

## 2022-06-02 NOTE — Progress Notes (Signed)
BP 124/77   Pulse 76   Temp (!) 97.5 F (36.4 C) (Oral)   Ht 5' 9.29" (1.76 m)   Wt 207 lb (93.9 kg)   SpO2 97%   BMI 30.31 kg/m    Subjective:    Patient ID: Jonathan [name], male    DOB: 1945/10/18, 77 y.o.   MRN: [id number]  HPI: Jonathan [name] is a 77 y.o. male presenting on 06/02/2022 for Medicare Wellness and Diabetes Follow-up. Current medical complaints include:none  He currently lives with: wife Interim Problems from his last visit: no  DIABETES At visit in October A1c was 6.7%.  Taking Trulicity 4.5 MG weekly and Metformin XR 500 MG BID.    Tried Invokana, but was discontinued in past due to kidney function.  Over Christmas indulged some with diet.  Continues to walk 2 miles every other day. Hypoglycemic episodes:no Polydipsia/polyuria: no Visual disturbance: no Chest pain: no Paresthesias: no Glucose Monitoring: yes             Accucheck frequency: not checking             Fasting glucose:              Post prandial:              Evening:              Before meals: Taking Insulin?: no             Long acting insulin:             Short acting insulin: Blood Pressure Monitoring: daily Retinal Examination: Up to date -- due in April 2024 (every other year) Foot Exam: Up to Date Pneumovax: Up to Date Influenza: Up to Date Aspirin: no    HYPERTENSION / HYPERLIPIDEMIA Continues on Amlodipine, Lisinopril, Metoprolol, Atorvastatin, Apixaban. Satisfied with current treatment? yes Duration of hypertension: chronic BP monitoring frequency: every day BP range:  BP medication side effects: no Duration of hyperlipidemia: chronic Cholesterol medication side effects: no Cholesterol supplements: none Medication compliance: good compliance Aspirin: no Recent stressors: no Recurrent headaches: no Visual changes: no Palpitations: no Dyspnea: no Chest pain: no Lower extremity edema: no Dizzy/lightheaded: no    CHRONIC KIDNEY DISEASE CKD status:  stable Medications renally dose: yes Previous renal evaluation: yes Pneumovax:  Up to Date Influenza Vaccine:  Up to Date   ATRIAL FIBRILLATION Follows with Dr. Gwen Pounds last 02/18/22 -- continues Metoprolol, Eliquis, Flecainide. Continues on Atorvastatin for HLD.     Saw Dr. Maisie Fus from EP in 2021 about left atrial appendage closure device, but has opted not to do this. Last EF was > 55% on 02/09/21.  Last carotid doppler and stress test was on 02/09/21. Atrial fibrillation status: controlled Satisfied with current treatment: stable Medication side effects:  no Medication compliance: good compliance Etiology of atrial fibrillation:  Palpitations:  no Chest pain:  no Dyspnea on exertion:  no Orthopnea:  no Syncope:  no Edema:  no Ventricular rate control: B-blocker Anti-coagulation: long acting   HYPOTHYROIDISM Stable on Levothyroxine 125 MCG daily.   Thyroid control status:controlled Satisfied with current treatment? no Medication side effects: no Medication compliance: good compliance Etiology of hypothyroidism:  Recent dose adjustment:no Fatigue: no Cold intolerance: no Heat intolerance: no Weight gain: no Weight loss: no Constipation: no Diarrhea/loose stools: no Palpitations: no Lower extremity edema: no Anxiety/depressed mood: no   Functional Status Survey: Is the patient deaf or have difficulty hearing?: No Does the patient have  difficulty seeing, even when wearing glasses/contacts?: No Does the patient have difficulty concentrating, remembering, or making decisions?: No Does the patient have difficulty walking or climbing stairs?: No Does the patient have difficulty dressing or bathing?: No Does the patient have difficulty doing errands alone such as visiting a doctor's office or shopping?: No     08/20/2021    9:24 AM 11/30/2021    9:54 AM 03/02/2022    9:49 AM 06/02/2022    9:11 AM 06/02/2022    9:13 AM  [place] in the past year? 0 0 0 0 0  Was  there an injury with Fall? 0 0 0 0 0  Fall Risk Category Calculator 0 0 0 0 0  Fall Risk Category Low Low Low Low Low  Patient Fall Risk Level  Low fall risk Low fall risk  Low fall risk  Patient at Risk for Falls Due to No Fall Risks No Fall Risks No Fall Risks No Fall Risks No Fall Risks  Fall risk Follow up Falls evaluation completed Falls evaluation completed Falls evaluation completed Falls evaluation completed Falls prevention discussed       06/02/2022    9:11 AM 03/02/2022    9:49 AM 11/30/2021    9:54 AM 08/20/2021    9:24 AM 06/01/2021    8:28 AM  Depression screen PHQ 2/9  Decreased Interest 0 0 0 0 0  Down, Depressed, Hopeless 0 0 0 0 0  PHQ - 2 Score 0 0 0 0 0  Altered sleeping 1 1 1 1    Tired, decreased energy 0 0 0 0   Change in appetite 0 0 0 0   Feeling bad or failure about yourself  1 0 0 0   Trouble concentrating 1 0 0 0   Moving slowly or fidgety/restless 0 0 0 0   Suicidal thoughts 0 0 0 0   PHQ-9 Score 3 1 1 1    Difficult doing work/chores Not difficult at all Not difficult at all Not difficult at all      Advanced Directives A voluntary discussion about advance care planning including the explanation and discussion of advance directives was extensively discussed  with the patient for 15 minutes with patient.  Explanation about the health care proxy and Living will was reviewed and packet with forms with explanation of how to fill them out was given.  During this discussion, the patient was able to identify a health care proxy as wife and plans to fill out the paperwork required.  Patient was offered a separate [place] visit for further assistance with forms.  This information is scanned in chart.  Past Medical History:  Past Medical History:  Diagnosis Date   Diabetes mellitus without complication ([place])    Hyperlipidemia    Hypertension    Lymphopenia    Thrombocytopenia ([place])     Surgical History:  Past Surgical History:  Procedure  Laterality Date   CARDIOVERSION N/A 11/22/2019   Procedure: CARDIOVERSION;  Surgeon: Corey Skains, MD;  Location: [facility] ORS;  Service: Cardiovascular;  Laterality: N/A;   heart ablation     KNEE SURGERY Bilateral     Medications:  Current Outpatient Medications on File Prior to Visit  Medication Sig   amLODipine (NORVASC) 5 MG tablet Take 5 mg by mouth daily.   apixaban (ELIQUIS) 5 MG TABS tablet Take 5 mg by mouth in the morning and at bedtime.    atorvastatin (LIPITOR) 80 MG tablet Take  1 tablet (80 mg total) by mouth daily.   Cholecalciferol (D3-1000) 25 MCG (1000 UT) capsule    cholecalciferol (VITAMIN D) 1000 units tablet Take 1,000 Units by mouth daily.   Dulaglutide (TRULICITY) 4.5 MG/0.5ML SOPN Inject 4.5 mg as directed once a week.   flecainide (TAMBOCOR) 50 MG tablet Take 100 mg by mouth in the morning and at bedtime.    levothyroxine (SYNTHROID) 125 MCG tablet Take 1 tablet (125 mcg total) by mouth daily.   lisinopril (ZESTRIL) 2.5 MG tablet Take 1 tablet (2.5 mg total) by mouth daily.   magnesium oxide (MAG-OX) 400 MG tablet Take 1 tablet (400 mg total) by mouth daily.   metoprolol tartrate (LOPRESSOR) 25 MG tablet Take 25 mg by mouth 2 (two) times daily.   Multiple Vitamin (MULTIVITAMIN) tablet Take 1 tablet by mouth daily.   ONE TOUCH ULTRA TEST test strip USE TO CHECK SUGAR TWICE DAILY   vitamin B-12 (CYANOCOBALAMIN) 1000 MCG tablet Take 1,000 mcg by mouth daily. Every other day   No current facility-administered medications on file prior to visit.    Allergies:  No Known Allergies  Social History:  Social History   Socioeconomic History   Marital status: Married    Spouse name: Not on file   Number of children: Not on file   Years of education: Not on file   Highest education level: Not on file  Occupational History   Occupation: retired  Tobacco Use   Smoking status: Former    Packs/day: 1.00    Years: 35.00    Total pack years: 35.00    Types:  Cigarettes    Quit date: 03/06/1978    Years since quitting: 44.2   Smokeless tobacco: Former    Quit date: 03/21/2007  Vaping Use   Vaping Use: Never used  Substance and Sexual Activity   Alcohol use: Yes    Alcohol/week: 2.0 standard drinks of alcohol    Types: 2 Shots of liquor per week    Comment: approx. mixed drink 1-2x week   Drug use: No   Sexual activity: Yes  Other Topics Concern   Not on file  Social History Narrative   Not on file   Social Determinants of Health   Financial Resource Strain: Low Risk  (06/02/2022)   Overall Financial Resource Strain (CARDIA)    Difficulty of Paying Living Expenses: Not hard at all  Food Insecurity: No Food Insecurity (06/02/2022)   Hunger Vital Sign    Worried About Running Out of Food in the Last Year: Never true    Ran Out of Food in the Last Year: Never true  Transportation Needs: No Transportation Needs (06/02/2022)   PRAPARE - Administrator, Civil Service (Medical): No    Lack of Transportation (Non-Medical): No  Physical Activity: Sufficiently Active (06/02/2022)   Exercise Vital Sign    Days of Exercise per Week: 5 days    Minutes of Exercise per Session: 30 min  Stress: No Stress Concern Present (06/02/2022)   [employer] of Occupational Health - Occupational Stress Questionnaire    Feeling of Stress : Only a little  Social Connections: Moderately Integrated (06/02/2022)   Social Connection and Isolation Panel [NHANES]    Frequency of Communication with Friends and Family: More than three times a week    Frequency of Social Gatherings with Friends and Family: More than three times a week    Attends Religious Services: 1 to 4 times per year  Active Member of Clubs or Organizations: No    Attends Banker Meetings: Never    Marital Status: Married  Catering manager Violence: Not At Risk (06/02/2022)   Humiliation, Afraid, Rape, and Kick questionnaire    Fear of Current or Ex-Partner: No     Emotionally Abused: No    Physically Abused: No    Sexually Abused: No   Social History   Tobacco Use  Smoking Status Former   Packs/day: 1.00   Years: 35.00   Total pack years: 35.00   Types: Cigarettes   Quit date: 03/06/1978   Years since quitting: 44.2  Smokeless Tobacco Former   Quit date: 03/21/2007   Social History   Substance and Sexual Activity  Alcohol Use Yes   Alcohol/week: 2.0 standard drinks of alcohol   Types: 2 Shots of liquor per week   Comment: approx. mixed drink 1-2x week    Family History:  Family History  Problem Relation Age of Onset   Hypertension Mother    Lung cancer Father        lung   Lung disease Father    Hypertension Father    Cancer Sister        breast   Stomach cancer Sister    Healthy Daughter     Past medical history, surgical history, medications, allergies, family history and social history reviewed with patient today and changes made to appropriate areas of the chart.   ROS All other ROS negative except what is listed above and in the HPI.      Objective:    BP 124/77   Pulse 76   Temp (!) 97.5 F (36.4 C) (Oral)   Ht 5' 9.29" (1.76 m)   Wt 207 lb (93.9 kg)   SpO2 97%   BMI 30.31 kg/m   Wt Readings from Last 3 Encounters:  06/02/22 207 lb (93.9 kg)  03/02/22 209 lb 6.4 oz (95 kg)  11/30/21 206 lb 6.4 oz (93.6 kg)    Physical Exam Vitals and nursing note reviewed.  Constitutional:      General: He is awake. He is not in acute distress.    Appearance: He is well-developed, well-groomed and overweight. He is not ill-appearing.  HENT:     Head: Normocephalic and atraumatic.     Right Ear: Hearing normal. No drainage.     Left Ear: Hearing normal. No drainage.  Eyes:     General: Lids are normal.        Right eye: No discharge.        Left eye: No discharge.     Conjunctiva/sclera: Conjunctivae normal.     Pupils: Pupils are equal, round, and reactive to light.  Neck:     Trachea: Trachea normal.   Cardiovascular:     Rate and Rhythm: Normal rate and regular rhythm.     Heart sounds: Normal heart sounds, S1 normal and S2 normal. No murmur heard.    No gallop.  Pulmonary:     Effort: Pulmonary effort is normal. No accessory muscle usage or respiratory distress.     Breath sounds: Normal breath sounds.  Abdominal:     General: Bowel sounds are normal.     Palpations: Abdomen is soft. There is no hepatomegaly or splenomegaly.  Musculoskeletal:        General: Normal range of motion.     Cervical back: Normal range of motion and neck supple.     Right lower leg: No edema.  Left lower leg: No edema.  Skin:    General: Skin is warm and dry.  Neurological:     Mental Status: He is alert and oriented to person, place, and time.  Psychiatric:        Attention and Perception: Attention normal.        Mood and Affect: Mood normal.        Speech: Speech normal.        Behavior: Behavior normal. Behavior is cooperative.        Thought Content: Thought content normal.       06/02/2022    9:13 AM 05/30/2020    8:19 AM 05/18/2018    9:25 AM 05/06/2017    9:42 AM  6CIT Screen  What Year? 0 points 0 points 0 points 0 points  What month? 0 points 0 points 0 points 0 points  What time? 0 points 0 points 0 points 0 points  Count back from 20 0 points 2 points 0 points 0 points  Months in reverse 0 points 0 points 0 points 0 points  Repeat phrase 0 points 2 points 0 points 0 points  Total Score 0 points 4 points 0 points 0 points   Results for orders placed or performed in visit on 06/02/22  Bayer DCA Hb A1c Waived  Result Value Ref Range   HB A1C (BAYER DCA - WAIVED) 8.0 (H) 4.8 - 5.6 %  Microalbumin, Urine Waived  Result Value Ref Range   Microalb, Ur Waived 30 (H) 0 - 19 mg/L   Creatinine, Urine Waived 200 10 - 300 mg/dL   Microalb/Creat Ratio <30 <30 mg/g      Assessment & Plan:   Problem List Items Addressed This Visit       Cardiovascular and Mediastinum    Hypertension associated with diabetes ([place])   Relevant Medications   metFORMIN (GLUCOPHAGE-XR) 500 MG 24 hr tablet   Other Relevant Orders   Bayer DCA Hb A1c Waived (Completed)   Microalbumin, Urine Waived (Completed)   Comprehensive metabolic panel   Paroxysmal A-fib (HCC)    Chronic, follows with cardiology.  Rate controlled.  Continue current medication regimen as prescribed by cardiology at this time and adjust as needed.  Recent cardiology note reviewed.  Continue collaboration.        Relevant Orders   Comprehensive metabolic panel   Senile purpura ([place])    Ongoing.  Noted on exam, on anticoagulant therapy for a-fib.  Recommend gentle skin care at home and monitor for wounds, if present then immediately notify provider.      Relevant Orders   CBC with Differential/Platelet     Endocrine   Hyperlipidemia associated with type 2 diabetes mellitus (HCC)    Chronic, ongoing.  Continue with current regimen and adjust as needed based on labs.  Lipid panel today.      Relevant Medications   metFORMIN (GLUCOPHAGE-XR) 500 MG 24 hr tablet   Other Relevant Orders   Bayer DCA Hb A1c Waived (Completed)   Comprehensive metabolic panel   Lipid Panel w/o Chol/HDL Ratio   Hypothyroidism    Chronic, ongoing.  Continue current medication regimen and adjust as needed based on labs.  Thyroid obtained today.      Relevant Orders   T4, free   TSH   Type 2 diabetes mellitus with chronic kidney disease, without long-term current use of insulin (HCC)    Chronic, ongoing.  A1c 8% today, trend up from 6.7% due to holidays.  Urine Alb 30 (January 2024).   - Continue Trulicity 4.5 MG weekly + continue Metformin ER 500 MG BID -- discussed with patient may benefit in future [facility] Far[facility] and discontinuation Metformin due to kidney function.   Renal function limits medications that can be used + cost -- donut hole issues with Medicare.  He wishes to hold off on changes. - Recommend he  check blood sugar twice a day, in morning fasting and 2 hours after a meal, document these.   - Collaborate with CCM team.   - Could consider a very low dose sulfonyurea if needed to keep cost low in future and avoid insulin.  At this time recommend heavy diet focus and regular exercise. - Return in 3 months.      Relevant Medications   metFORMIN (GLUCOPHAGE-XR) 500 MG 24 hr tablet   Other Relevant Orders   Bayer DCA Hb A1c Waived (Completed)   Microalbumin, Urine Waived (Completed)   Comprehensive metabolic panel     Genitourinary   CKD (chronic kidney disease), stage III (HCC)    Chronic, ongoing.  Continue to collaborate with nephrology as needed and renal dose medication, Lisinopril for kidney protection.  CMP today.  Urine Alb 30 (January 2024).      Relevant Orders   Microalbumin, Urine Waived (Completed)   Comprehensive metabolic panel     Hematopoietic and Hemostatic   Thrombocytopenia (HCC)    Chronic, stable.  Recheck CBC every 6 months (ordered today), no current symptoms.  Previously followed by Dr. Kizzie BaneBrahamanday.  CBC today - for Q6MOS check.      Relevant Orders   CBC with Differential/Platelet     Other   Obesity    BMI 30.31.  Recommended eating smaller high protein, low fat meals more frequently and exercising 30 mins a day 5 times a week with a goal of 10-15lb weight loss in the next 3 months. Patient voiced their understanding and motivation to adhere to these recommendations.       Relevant Medications   metFORMIN (GLUCOPHAGE-XR) 500 MG 24 hr tablet   Vitamin B12 deficiency    Ongoing with chronic Metformin use, will recheck this level today and adjust supplement as needed.      Relevant Orders   CBC with Differential/Platelet   Vitamin B12   Other Visit Diagnoses     Medicare annual wellness visit, subsequent    -  Primary   Medicare wellness due and performed today.       LABORATORY TESTING:  Health maintenance labs ordered today as discussed  above.   IMMUNIZATIONS:   - Tetanus vaccination status reviewed: last tetanus booster within 10 years. - Influenza: Up to date - Pneumovax: Up to date - Prevnar: Up to date - Zostavax vaccine: Up to date  SCREENING: - Colonoscopy: Up to date  Discussed with patient purpose of the colonoscopy is to detect colon cancer at curable precancerous or early stages   - AAA Screening: Not applicable  -Hearing Test: Not applicable  -Spirometry: Not applicable   PATIENT COUNSELING:    Sexuality: Discussed sexually transmitted diseases, partner selection, use of condoms, avoidance of unintended pregnancy  and contraceptive alternatives.   Advised to avoid cigarette smoking.  I discussed with the patient that most people either abstain from alcohol or drink within safe limits (<=14/week and <=4 drinks/occasion for males, <=7/weeks and <= 3 drinks/occasion for females) and that the risk for alcohol disorders and other health effects rises proportionally with  the number of drinks per week and how often a drinker exceeds daily limits.  Discussed cessation/primary prevention of drug use and availability of treatment for abuse.   Diet: Encouraged to adjust caloric intake to maintain  or achieve ideal body weight, to reduce intake of dietary saturated fat and total fat, to limit sodium intake by avoiding high sodium foods and not adding table salt, and to maintain adequate dietary potassium and calcium preferably from fresh fruits, vegetables, and low-fat dairy products.    Stressed the importance of regular exercise  Injury prevention: Discussed safety belts, safety helmets, smoke detector, smoking near bedding or upholstery.   Dental health: Discussed importance of regular tooth brushing, flossing, and dental visits.   Follow up plan: NEXT PREVENTATIVE PHYSICAL DUE IN 1 YEAR. Return in about 3 months (around 09/01/2022) for T2DM, HTN/HLD, THYROID, A-FIB, CKD.

## 2022-06-02 NOTE — Assessment & Plan Note (Signed)
Ongoing.  Noted on exam, on anticoagulant therapy for a-fib.  Recommend gentle skin care at home and monitor for wounds, if present then immediately notify provider.

## 2022-06-02 NOTE — Assessment & Plan Note (Signed)
Chronic, ongoing.  A1c 8% today, trend up from 6.7% due to holidays.  Urine Alb 30 (January 2024).   - Continue Trulicity 4.5 MG weekly + continue Metformin ER 500 MG BID -- discussed with patient may benefit in future change to [facility] or Farxiga and discontinuation Metformin due to kidney function.   Renal function limits medications that can be used + cost -- donut hole issues with Medicare.  He wishes to hold off on changes. - Recommend he check blood sugar twice a day, in morning fasting and 2 hours after a meal, document these.   - Collaborate with CCM team.   - Could consider a very low dose sulfonyurea if needed to keep cost low in future and avoid insulin.  At this time recommend heavy diet focus and regular exercise. - Return in 3 months.

## 2022-06-02 NOTE — Assessment & Plan Note (Signed)
Chronic, ongoing.  Continue with current regimen and adjust as needed based on labs.  Lipid panel today.

## 2022-06-02 NOTE — Assessment & Plan Note (Signed)
Chronic, ongoing.  Continue current medication regimen and adjust as needed based on labs.  Thyroid obtained today.

## 2022-06-02 NOTE — Assessment & Plan Note (Signed)
Chronic, ongoing.  Continue to collaborate with nephrology as needed and renal dose medication, Lisinopril for kidney protection.  CMP today.  Urine Alb 30 (January 2024).

## 2022-06-02 NOTE — Assessment & Plan Note (Signed)
BMI 30.31.  Recommended eating smaller high protein, low fat meals more frequently and exercising 30 mins a day 5 times a week with a goal of 10-15lb weight loss in the next 3 months. Patient voiced their understanding and motivation to adhere to these recommendations.  

## 2022-06-02 NOTE — Assessment & Plan Note (Signed)
Chronic, stable.  Recheck CBC every 6 months (ordered today), no current symptoms.  Previously followed by Dr. Tish Men.  CBC today - for Q6MOS check.

## 2022-06-02 NOTE — Assessment & Plan Note (Signed)
Chronic, follows with cardiology.  Rate controlled.  Continue current medication regimen as prescribed by cardiology at this time and adjust as needed.  Recent cardiology note reviewed.  Continue collaboration.

## 2022-06-02 NOTE — Assessment & Plan Note (Signed)
Ongoing with chronic Metformin use, will recheck this level today and adjust supplement as needed.

## 2022-06-03 LAB — CBC WITH DIFFERENTIAL/PLATELET
Basophils Absolute: 0 10*3/uL (ref 0.0–0.2)
Basos: 1 %
EOS (ABSOLUTE): 0.2 10*3/uL (ref 0.0–0.4)
Eos: 4 %
Hematocrit: 40.6 % (ref 37.5–51.0)
Hemoglobin: 13.9 g/dL (ref 13.0–17.7)
Immature Grans (Abs): 0 10*3/uL (ref 0.0–0.1)
Immature Granulocytes: 0 %
Lymphocytes Absolute: 1.2 10*3/uL (ref 0.7–3.1)
Lymphs: 21 %
MCH: 28.7 pg (ref 26.6–33.0)
MCHC: 34.2 g/dL (ref 31.5–35.7)
MCV: 84 fL (ref 79–97)
Monocytes Absolute: 0.6 10*3/uL (ref 0.1–0.9)
Monocytes: 10 %
Neutrophils Absolute: 3.6 10*3/uL (ref 1.4–7.0)
Neutrophils: 64 %
Platelets: 130 10*3/uL — ABNORMAL LOW (ref 150–450)
RBC: 4.84 x10E6/uL (ref 4.14–5.80)
RDW: 12.9 % (ref 11.6–15.4)
WBC: 5.7 10*3/uL (ref 3.4–10.8)

## 2022-06-03 LAB — COMPREHENSIVE METABOLIC PANEL
ALT: 11 IU/L (ref 0–44)
AST: 17 IU/L (ref 0–40)
Albumin/Globulin Ratio: 1.7 (ref 1.2–2.2)
Albumin: 4.5 g/dL (ref 3.8–4.8)
Alkaline Phosphatase: 112 IU/L (ref 44–121)
BUN/Creatinine Ratio: 15 (ref 10–24)
BUN: 23 mg/dL (ref 8–27)
Bilirubin Total: 0.6 mg/dL (ref 0.0–1.2)
CO2: 21 mmol/L (ref 20–29)
Calcium: 9.5 mg/dL (ref 8.6–10.2)
Chloride: 100 mmol/L (ref 96–106)
Creatinine, Ser: 1.55 mg/dL — ABNORMAL HIGH (ref 0.76–1.27)
Globulin, Total: 2.6 g/dL (ref 1.5–4.5)
Glucose: 153 mg/dL — ABNORMAL HIGH (ref 70–99)
Potassium: 4.7 mmol/L (ref 3.5–5.2)
Sodium: 137 mmol/L (ref 134–144)
Total Protein: 7.1 g/dL (ref 6.0–8.5)
eGFR: 46 mL/min/{1.73_m2} — ABNORMAL LOW (ref >59)

## 2022-06-03 LAB — LIPID PANEL W/O CHOL/HDL RATIO
Cholesterol, Total: 146 mg/dL (ref 100–199)
HDL: 50 mg/dL (ref >39)
LDL Chol Calc (NIH): 73 mg/dL (ref 0–99)
Triglycerides: 132 mg/dL (ref 0–149)
VLDL Cholesterol Cal: 23 mg/dL (ref 5–40)

## 2022-06-03 LAB — TSH: TSH: 1.97 u[IU]/mL (ref 0.450–4.500)

## 2022-06-03 LAB — T4, FREE: Free T4: 1.52 ng/dL (ref 0.82–1.77)

## 2022-06-03 LAB — VITAMIN B12: Vitamin B-12: 1290 pg/mL — ABNORMAL HIGH (ref 232–1245)

## 2022-06-03 NOTE — Progress Notes (Signed)
Contacted via MyChart   Good evening Jonathan [name], your labs have returned: - CBC shows no anemia or infection.  Platelets remain on lower side, but no worsening.  We will continue to monitor every 6 months. - Kidney function, creatinine and eGFR, continue to show mild stage 3a to b kidney disease, no worsening.  We will continue to monitor.  Liver function normal, AST and ALT. - B12 level is a bit above normal, but this is only mild -- continue supplement. - Remainder of labs normal -- continue all current medications. Any questions? Keep being amazing!!  Thank you for allowing me to participate in your care.  I appreciate you. Kindest regards, Ashrita Chrismer

## 2022-06-13 ENCOUNTER — Other Ambulatory Visit: Payer: Self-pay | Admitting: Nurse Practitioner

## 2022-06-15 NOTE — Telephone Encounter (Signed)
Refused Lisinopril 20 mg because it was discontinued on 12/01/2021 due to a dose change.   Now on 2.5 mg

## 2022-06-20 ENCOUNTER — Encounter: Payer: Self-pay | Admitting: Nurse Practitioner

## 2022-07-25 ENCOUNTER — Encounter: Payer: Self-pay | Admitting: Nurse Practitioner

## 2022-08-20 ENCOUNTER — Encounter: Payer: Self-pay | Admitting: Nurse Practitioner

## 2022-08-29 NOTE — Patient Instructions (Signed)
Be Involved in Your Health Care:  Taking Medications When medications are taken as directed, they can greatly improve your health. But if they are not taken as instructed, they may not work. In some cases, not taking them correctly can be harmful. To help ensure your treatment remains effective and safe, understand your medications and how to take them.  Your lab results, notes and after visit summary will be available on My Chart. We strongly encourage you to use this feature. If lab results are abnormal the clinic will contact you with the appropriate steps. If the clinic does not contact you assume the results are satisfactory. You can always see your results on My Chart. If you have questions regarding your condition, please contact the clinic during office hours. You can also ask questions on My Chart.  We at [facility] are grateful that you chose us to provide care. We strive to provide excellent and compassionate care and are always looking for feedback. If you get a survey from the clinic please complete this.   Diabetes Mellitus and Nutrition, Adult When you have diabetes, or diabetes mellitus, it is very important to have healthy eating habits because your blood sugar (glucose) levels are greatly affected by what you eat and drink. Eating healthy foods in the right amounts, at about the same times every day, can help you: Manage your blood glucose. Lower your risk of heart disease. Improve your blood pressure. Reach or maintain a healthy weight. What can affect my meal plan? Every person with diabetes is different, and each person has different needs for a meal plan. Your health care provider may recommend that you work with a dietitian to make a meal plan that is best for you. Your meal plan may vary depending on factors such as: The calories you need. The medicines you take. Your weight. Your blood glucose, blood pressure, and cholesterol levels. Your activity  level. Other health conditions you have, such as heart or kidney disease. How do carbohydrates affect me? Carbohydrates, also called carbs, affect your blood glucose level more than any other type of food. Eating carbs raises the amount of glucose in your blood. It is important to know how many carbs you can safely have in each meal. This is different for every person. Your dietitian can help you calculate how many carbs you should have at each meal and for each snack. How does alcohol affect me? Alcohol can cause a decrease in blood glucose (hypoglycemia), especially if you use insulin or take certain diabetes medicines by mouth. Hypoglycemia can be a life-threatening condition. Symptoms of hypoglycemia, such as sleepiness, dizziness, and confusion, are similar to symptoms of having too much alcohol. Do not drink alcohol if: Your health care provider tells you not to drink. You are pregnant, may be pregnant, or are planning to become pregnant. If you drink alcohol: Limit how much you have to: 0-1 drink a day for women. 0-2 drinks a day for men. Know how much alcohol is in your drink. In the [place] one drink equals one 12 oz bottle of beer (355 mL), one 5 oz glass of wine (148 mL), or one 1 oz glass of hard liquor (44 mL). Keep yourself hydrated with water, diet soda, or unsweetened iced tea. Keep in mind that regular soda, juice, and other mixers may contain a lot of sugar and must be counted as carbs. What are tips for following this plan?  Reading food labels Start by checking the serving   size on the Nutrition Facts label of packaged foods and drinks. The number of calories and the amount of carbs, fats, and other nutrients listed on the label are based on one serving of the item. Many items contain more than one serving per package. Check the total grams (g) of carbs in one serving. Check the number of grams of saturated fats and trans fats in one serving. Choose foods that have a low amount  or none of these fats. Check the number of milligrams (mg) of salt (sodium) in one serving. Most people should limit total sodium intake to less than 2,300 mg per day. Always check the nutrition information of foods labeled as "low-fat" or "nonfat." These foods may be higher in added sugar or refined carbs and should be avoided. Talk to your dietitian to identify your daily goals for nutrients listed on the label. Shopping Avoid buying canned, pre-made, or processed foods. These foods tend to be high in fat, sodium, and added sugar. Shop around the outside edge of the grocery store. This is where you will most often find fresh fruits and vegetables, bulk grains, fresh meats, and fresh dairy products. Cooking Use low-heat cooking methods, such as baking, instead of high-heat cooking methods, such as deep frying. Cook using healthy oils, such as olive, canola, or sunflower oil. Avoid cooking with butter, cream, or high-fat meats. Meal planning Eat meals and snacks regularly, preferably at the same times every day. Avoid going long periods of time without eating. Eat foods that are high in fiber, such as fresh fruits, vegetables, beans, and whole grains. Eat 4-6 oz (112-168 g) of lean protein each day, such as lean meat, chicken, fish, eggs, or tofu. One ounce (oz) (28 g) of lean protein is equal to: 1 oz (28 g) of meat, chicken, or fish. 1 egg.  cup (62 g) of tofu. Eat some foods each day that contain healthy fats, such as avocado, nuts, seeds, and fish. What foods should I eat? Fruits Berries. Apples. Oranges. Peaches. Apricots. Plums. Grapes. Mangoes. Papayas. Pomegranates. Kiwi. Cherries. Vegetables Leafy greens, including lettuce, spinach, kale, chard, collard greens, mustard greens, and cabbage. Beets. Cauliflower. Broccoli. Carrots. Green beans. Tomatoes. Peppers. Onions. Cucumbers. Brussels sprouts. Grains Whole grains, such as whole-wheat or whole-grain bread, crackers, tortillas,  cereal, and pasta. Unsweetened oatmeal. Quinoa. Brown or wild rice. Meats and other proteins Seafood. Poultry without skin. Lean cuts of poultry and beef. Tofu. Nuts. Seeds. Dairy Low-fat or fat-free dairy products such as milk, yogurt, and cheese. The items listed above may not be a complete list of foods and beverages you can eat and drink. Contact a dietitian for more information. What foods should I avoid? Fruits Fruits canned with syrup. Vegetables Canned vegetables. Frozen vegetables with butter or cream sauce. Grains Refined white flour and flour products such as bread, pasta, snack foods, and cereals. Avoid all processed foods. Meats and other proteins Fatty cuts of meat. Poultry with skin. Breaded or fried meats. Processed meat. Avoid saturated fats. Dairy Full-fat yogurt, cheese, or milk. Beverages Sweetened drinks, such as soda or iced tea. The items listed above may not be a complete list of foods and beverages you should avoid. Contact a dietitian for more information. Questions to ask a health care provider Do I need to meet with a certified diabetes care and education specialist? Do I need to meet with a dietitian? What number can I call if I have questions? When are the best times to check my blood glucose?   Where to find more information: American Diabetes Association: diabetes.org Academy of Nutrition and Dietetics: eatright.org National Institute of Diabetes and Digestive and Kidney Diseases: niddk.nih.gov Association of Diabetes Care & Education Specialists: diabeteseducator.org Summary It is important to have healthy eating habits because your blood sugar (glucose) levels are greatly affected by what you eat and drink. It is important to use alcohol carefully. A healthy meal plan will help you manage your blood glucose and lower your risk of heart disease. Your health care provider may recommend that you work with a dietitian to make a meal plan that is best for  you. This information is not intended to replace advice given to you by your health care provider. Make sure you discuss any questions you have with your health care provider. Document Revised: 12/12/2019 Document Reviewed: 12/12/2019 Elsevier Patient Education  2023 Elsevier Inc.  

## 2022-09-01 ENCOUNTER — Encounter: Payer: Self-pay | Admitting: Unknown Physician Specialty

## 2022-09-01 ENCOUNTER — Ambulatory Visit (INDEPENDENT_AMBULATORY_CARE_PROVIDER_SITE_OTHER): Payer: Medicare Other | Admitting: Unknown Physician Specialty

## 2022-09-01 VITALS — BP 132/84 | HR 80 | Temp 97.6°F | Ht 69.29 in | Wt 204.3 lb

## 2022-09-01 DIAGNOSIS — Z8679 Personal history of other diseases of the circulatory system: Secondary | ICD-10-CM

## 2022-09-01 DIAGNOSIS — Z9889 Other specified postprocedural states: Secondary | ICD-10-CM | POA: Diagnosis not present

## 2022-09-01 DIAGNOSIS — E1169 Type 2 diabetes mellitus with other specified complication: Secondary | ICD-10-CM | POA: Diagnosis not present

## 2022-09-01 DIAGNOSIS — I480 Paroxysmal atrial fibrillation: Secondary | ICD-10-CM | POA: Diagnosis not present

## 2022-09-01 DIAGNOSIS — I152 Hypertension secondary to endocrine disorders: Secondary | ICD-10-CM

## 2022-09-01 DIAGNOSIS — E6609 Other obesity due to excess calories: Secondary | ICD-10-CM | POA: Diagnosis not present

## 2022-09-01 DIAGNOSIS — N1832 Chronic kidney disease, stage 3b: Secondary | ICD-10-CM

## 2022-09-01 DIAGNOSIS — L989 Disorder of the skin and subcutaneous tissue, unspecified: Secondary | ICD-10-CM | POA: Diagnosis not present

## 2022-09-01 DIAGNOSIS — E1122 Type 2 diabetes mellitus with diabetic chronic kidney disease: Secondary | ICD-10-CM

## 2022-09-01 DIAGNOSIS — E785 Hyperlipidemia, unspecified: Secondary | ICD-10-CM | POA: Diagnosis not present

## 2022-09-01 DIAGNOSIS — Z6830 Body mass index (BMI) 30.0-30.9, adult: Secondary | ICD-10-CM

## 2022-09-01 DIAGNOSIS — I6523 Occlusion and stenosis of bilateral carotid arteries: Secondary | ICD-10-CM | POA: Diagnosis not present

## 2022-09-01 DIAGNOSIS — E039 Hypothyroidism, unspecified: Secondary | ICD-10-CM | POA: Diagnosis not present

## 2022-09-01 DIAGNOSIS — E1159 Type 2 diabetes mellitus with other circulatory complications: Secondary | ICD-10-CM | POA: Diagnosis not present

## 2022-09-01 LAB — BAYER DCA HB A1C WAIVED: HB A1C (BAYER DCA - WAIVED): 7.1 % — ABNORMAL HIGH (ref 4.8–5.6)

## 2022-09-01 NOTE — Assessment & Plan Note (Signed)
On max dose Atorvastatin

## 2022-09-01 NOTE — Progress Notes (Signed)
BP 132/84   Pulse 80   Temp 97.6 F (36.4 C) (Oral)   Ht 5' 9.29" (1.76 m)   Wt 204 lb 4.8 oz (92.7 kg)   SpO2 97%   BMI 29.92 kg/m    Subjective:    Patient ID: Jonathan [name], male    DOB: [birth year], 77 y.o.   MRN: [id number]  HPI: Jonathan [name] is a 77 y.o. male  Chief Complaint  Patient presents with   Diabetes   Chronic Kidney Disease   .Diabetes: Using medications without difficulties however, often unable to obtain the Trulicity No hypoglycemic episodes: no No hyperglycemic episodes: no Feet problems:none Blood Sugars averaging: Not checking eye exam within last year Last Hgb A1C: 8.0  Hypertension  Using medications without difficulty.   Average home BPs Usually lower than today Using medication without problems or lightheadedness No chest pain with exertion or shortness of breath No Edema  Elevated Cholesterol Using medications without problems No Muscle aches  Diet: Exercise:Walks 1 1/2 miles/day.  Getting ready to work in the yard.    CKD Last GFR 54.     Relevant past medical, surgical, family and social history reviewed and updated as indicated. Interim medical history since our last visit reviewed. Allergies and medications reviewed and updated.  Review of Systems  Per HPI unless specifically indicated above     Objective:    BP 132/84   Pulse 80   Temp 97.6 F (36.4 C) (Oral)   Ht 5' 9.29" (1.76 m)   Wt 204 lb 4.8 oz (92.7 kg)   SpO2 97%   BMI 29.92 kg/m   Wt Readings from Last 3 Encounters:  09/01/22 204 lb 4.8 oz (92.7 kg)  06/02/22 207 lb (93.9 kg)  03/02/22 209 lb 6.4 oz (95 kg)    Physical Exam Constitutional:      General: He is not in acute distress.    Appearance: Normal appearance. He is well-developed.  HENT:     Head: Normocephalic and atraumatic.  Eyes:     General: Lids are normal. No scleral icterus.       Right eye: No discharge.        Left eye: No discharge.     Conjunctiva/sclera: Conjunctivae  normal.  Neck:     Vascular: No carotid bruit or JVD.  Cardiovascular:     Rate and Rhythm: Normal rate and regular rhythm.     Heart sounds: Normal heart sounds.  Pulmonary:     Effort: Pulmonary effort is normal. No respiratory distress.     Breath sounds: Normal breath sounds.  Abdominal:     Palpations: There is no hepatomegaly or splenomegaly.  Musculoskeletal:        General: Normal range of motion.     Cervical back: Normal range of motion and neck supple.  Skin:    General: Skin is warm and dry.     Coloration: Skin is not pale.     Findings: No rash.     Comments: Concerned about a skin lesion  Neurological:     Mental Status: He is alert and oriented to person, place, and time.  Psychiatric:        Behavior: Behavior normal.        Thought Content: Thought content normal.        Judgment: Judgment normal.      Assessment & Plan:   Problem List Items Addressed This Visit       Unprioritized  Bilateral carotid artery stenosis   CKD (chronic kidney disease), stage III   Relevant Orders   Basic metabolic panel   Hyperlipidemia associated with type 2 diabetes mellitus    On max dose Atorvastatin      Relevant Orders   Bayer DCA Hb A1c Waived   Lipid Panel w/o Chol/HDL Ratio   Hypertension associated with diabetes    Stable, continue present medications.        Relevant Orders   Bayer DCA Hb A1c Waived   Hypothyroidism    Euthyroid 3 months ago      Obesity    Weight loss recently and planning on exercising more      Paroxysmal A-fib   S/P ablation of atrial fibrillation    RRR today      Type 2 diabetes mellitus with chronic kidney disease, without long-term current use of insulin - Primary    Hgb AIC was 7.1.  Continue present treatment.  Supply issues with Trulicity.  Will continue with tx as much as possible      Relevant Orders   Bayer DCA Hb A1c Waived   Other Visit Diagnoses     Skin lesion       Relevant Orders   Ambulatory  referral to Dermatology        Follow up plan: Return in about 3 months (around 12/01/2022).

## 2022-09-01 NOTE — Assessment & Plan Note (Signed)
Euthyroid 3 months ago

## 2022-09-01 NOTE — Assessment & Plan Note (Signed)
Stable, continue present medications.   

## 2022-09-01 NOTE — Assessment & Plan Note (Signed)
RRR today 

## 2022-09-01 NOTE — Assessment & Plan Note (Signed)
Weight loss recently and planning on exercising more

## 2022-09-01 NOTE — Assessment & Plan Note (Signed)
Hgb AIC was 7.1.  Continue present treatment.  Supply issues with Trulicity.  Will continue with tx as much as possible

## 2022-09-02 LAB — BASIC METABOLIC PANEL
BUN/Creatinine Ratio: 18 (ref 10–24)
BUN: 28 mg/dL — ABNORMAL HIGH (ref 8–27)
CO2: 21 mmol/L (ref 20–29)
Calcium: 9.7 mg/dL (ref 8.6–10.2)
Chloride: 101 mmol/L (ref 96–106)
Creatinine, Ser: 1.58 mg/dL — ABNORMAL HIGH (ref 0.76–1.27)
Glucose: 96 mg/dL (ref 70–99)
Potassium: 5.2 mmol/L (ref 3.5–5.2)
Sodium: 139 mmol/L (ref 134–144)
eGFR: 45 mL/min/{1.73_m2} — ABNORMAL LOW (ref >59)

## 2022-09-02 LAB — LIPID PANEL W/O CHOL/HDL RATIO
Cholesterol, Total: 143 mg/dL (ref 100–199)
HDL: 57 mg/dL (ref >39)
LDL Chol Calc (NIH): 64 mg/dL (ref 0–99)
Triglycerides: 123 mg/dL (ref 0–149)
VLDL Cholesterol Cal: 22 mg/dL (ref 5–40)

## 2022-09-02 NOTE — Progress Notes (Signed)
Contacted via MyChart   Good morning Mr. Schacht, sorry I missed you yesterday but I had to go for a root canal for a toothache and go for a crown in 2 weeks to the area.  I will now have two crowns beside each other and figure I can be a true [place] then;) Thank you for hanging out with Elnita Maxwell my partner in crime.  All your labs look stable.  Kidney function continues to show stage 3a to b disease with no worsening.  Cholesterol levels are at goal.  [place] job!!!  See you in 3 months:) Keep being amazing!!  Thank you for allowing me to participate in your care.  I appreciate you. Kindest regards, Harriette Tovey

## 2022-09-12 ENCOUNTER — Encounter: Payer: Self-pay | Admitting: Nurse Practitioner

## 2022-09-13 ENCOUNTER — Other Ambulatory Visit: Payer: Self-pay | Admitting: Nurse Practitioner

## 2022-09-14 NOTE — Telephone Encounter (Signed)
Requested Prescriptions  Pending Prescriptions Disp Refills   TRULICITY 4.5 MG/0.5ML SOPN [Pharmacy Med Name: TRULICITY 4.5 MG/0.5 ML PEN] 6 mL 0    Sig: INJECT 4.5MG  UNDER SKIN ONCE A WEEK     Endocrinology:  Diabetes - GLP-1 Receptor Agonists Passed - 09/13/2022  3:24 PM      Passed - HBA1C is between 0 and 7.9 and within 180 days    HB A1C (BAYER DCA - WAIVED)  Date Value Ref Range Status  09/01/2022 7.1 (H) 4.8 - 5.6 % Final    Comment:             Prediabetes: 5.7 - 6.4          Diabetes: >6.4          Glycemic control for adults with diabetes: <7.0          Passed - Valid encounter within last 6 months    Recent Outpatient Visits           1 week ago Type 2 diabetes mellitus with stage 3b chronic kidney disease, without long-term current use of insulin   [place] [facility] Gabriel Cirri, NP   3 months ago Medicare annual wellness visit, subsequent   [place] [facility] [place], [place] T, NP   6 months ago Type 2 diabetes mellitus with stage 3b chronic kidney disease, without long-term current use of insulin (HCC)   [place] [facility] [place], [place] T, NP   9 months ago Type 2 diabetes mellitus with stage 3b chronic kidney disease, without long-term current use of insulin (HCC)   [place] [facility] [place], [place] T, NP   1 year ago Type 2 diabetes mellitus with stage 3b chronic kidney disease, without long-term current use of insulin (HCC)   [place] [facility] [place], Dorie Rank, NP       Future Appointments             In 2 months Cannady, Dorie Rank, NP [place] [facility]   In 11 months Willeen Niece, MD [facility]

## 2022-09-29 ENCOUNTER — Encounter: Payer: Self-pay | Admitting: Nurse Practitioner

## 2022-10-01 ENCOUNTER — Encounter: Payer: Self-pay | Admitting: Nurse Practitioner

## 2022-10-01 MED ORDER — SEMAGLUTIDE (2 MG/DOSE) 8 MG/3ML ~~LOC~~ SOPN: 2.0000 mg | PEN_INJECTOR | SUBCUTANEOUS | 4 refills | Status: DC

## 2022-11-17 DIAGNOSIS — Z5181 Encounter for therapeutic drug level monitoring: Secondary | ICD-10-CM | POA: Diagnosis not present

## 2022-11-17 DIAGNOSIS — Z9889 Other specified postprocedural states: Secondary | ICD-10-CM | POA: Diagnosis not present

## 2022-11-17 DIAGNOSIS — I10 Essential (primary) hypertension: Secondary | ICD-10-CM | POA: Diagnosis not present

## 2022-11-17 DIAGNOSIS — Z8679 Personal history of other diseases of the circulatory system: Secondary | ICD-10-CM | POA: Diagnosis not present

## 2022-11-17 DIAGNOSIS — Z79899 Other long term (current) drug therapy: Secondary | ICD-10-CM | POA: Diagnosis not present

## 2022-11-17 DIAGNOSIS — I480 Paroxysmal atrial fibrillation: Secondary | ICD-10-CM | POA: Diagnosis not present

## 2022-11-17 DIAGNOSIS — I6523 Occlusion and stenosis of bilateral carotid arteries: Secondary | ICD-10-CM | POA: Diagnosis not present

## 2022-11-17 DIAGNOSIS — E782 Mixed hyperlipidemia: Secondary | ICD-10-CM | POA: Diagnosis not present

## 2022-12-05 NOTE — Patient Instructions (Signed)
Be Involved in Caring For Your Health:  Taking Medications When medications are taken as directed, they can greatly improve your health. But if they are not taken as prescribed, they may not work. In some cases, not taking them correctly can be harmful. To help ensure your treatment remains effective and safe, understand your medications and how to take them. Bring your medications to each visit for review by your provider.  Your lab results, notes, and after visit summary will be available on My Chart. We strongly encourage you to use this feature. If lab results are abnormal the clinic will contact you with the appropriate steps. If the clinic does not contact you assume the results are satisfactory. You can always view your results on My Chart. If you have questions regarding your health or results, please contact the clinic during office hours. You can also ask questions on My Chart.  We at [facility] are grateful that you chose us to provide your care. We strive to provide evidence-based and compassionate care and are always looking for feedback. If you get a survey from the clinic please complete this so we can hear your opinions.  Food Basics for Chronic Kidney Disease Chronic kidney disease (CKD) is when your kidneys are not working well. They cannot remove waste, fluids, and other substances from your blood the way they should. These substances can build up, which can worsen kidney damage and affect how your body works. Eating certain foods can lead to a buildup of these substances. Changing your diet can help prevent more kidney damage. Diet changes may also delay dialysis or even keep you from needing it. What nutrients should I limit? Work with your treatment team and a food expert (dietitian) to make a meal plan that's right for you. Foods you can eat and foods you should limit or avoid will depend on the stage of your kidney disease and any other health conditions you have.  The items listed below are not a complete list. Talk with your dietitian to learn what is best for you. Potassium Potassium affects how steadily your heart beats. Too much potassium in your blood can cause an irregular heartbeat or even a heart attack. You may need to limit foods that are high in potassium, such as: Liquid milk and soy milk. Salt substitutes that contain potassium. Fruits like bananas, apricots, nectarines, melon, prunes, raisins, kiwi, and oranges. Vegetables, such as potatoes, sweet potatoes, yams, tomatoes, leafy greens, beets, avocado, pumpkin, and winter squash. Beans, like lima beans. Nuts. Phosphorus Phosphorus is a mineral found in your bones. You need a balance between calcium and phosphorus to build and maintain healthy bones. Too much added phosphorus from the foods you eat can pull calcium from your bones. Losing calcium can make your bones weak and more likely to break. Too much phosphorus can also make your skin itch. You may need to limit foods that are high in phosphorus or that have added phosphorus, such as: Liquid milk and dairy products. Dark-colored sodas or soft drinks. Bran cereals and oatmeal. Protein  Protein helps you make and keep muscle. Protein also helps to repair your body's cells and tissues. One of the natural breakdown products of protein is a waste product called urea. When your kidneys are not working well, they cannot remove types of waste like urea. Reducing protein in your diet can help keep urea from building up in your blood. Depending on your stage of kidney disease, you may need to   eat smaller portions of foods that are high in protein. Sources of animal protein include: Meat (all types). Fish and seafood. Poultry. Eggs. Dairy. Other protein foods include: Beans and legumes. Nuts and nut butter. Soy, like tofu.  Sodium Salt (sodium) helps to keep a healthy balance of fluids in your body. Too much salt can increase your blood  pressure, which can harm your heart and lungs. Extra salt can also cause your body to keep too much fluid, making your kidneys work harder. You may need to limit or avoid foods that are high in salt, such as: Salt seasonings. Soy and teriyaki sauce. Packaged, precooked, cured, or processed meats, such as sausages or meat loaves. Sardines. Salted crackers and snack foods. Fast food. Canned soups and most canned foods. Pickled foods. Vegetable juice. Boxed mixes or ready-to-eat boxed meals and side dishes. Bottled dressings, sauces, and marinades. Talk with your dietitian about how much potassium, phosphorus, protein, and salt you may have each day. Helpful tips Read food labels  Check the amount of salt in foods. Limit foods that have salt or sodium listed among the first five ingredients. Try to eat low-salt foods. Check the ingredient list for added phosphorus or potassium. "Phos" in an ingredient is a sign that phosphorus has been added. Do not buy foods that are calcium-enriched or that have calcium added to them (are fortified). Buy canned vegetables and beans that say "no salt added" and rinse them before eating. Lifestyle Limit the amount of protein you eat from animal sources each day. Focus on protein from plant sources, like tofu and dried beans, peas, and lentils. Do not add salt to food when cooking or before eating. Do not eat star fruit. It can be toxic for people with kidney problems. Talk with your health care provider before taking any vitamin or mineral supplements. If told by your health care provider, track how much liquid you drink so you can avoid drinking too much. You may need to include foods you eat that are made mostly from water, like gelatin, ice cream, soups, and juicy fruits and vegetables. If you have diabetes: If you have diabetes (diabetes mellitus) and CKD, you need to keep your blood sugar (glucose) in the target range recommended by your health care  provider. Follow your diabetes management plan. This may include: Checking your blood glucose regularly. Taking medicines by mouth, or taking insulin, or both. Exercising for at least 30 minutes on 5 or more days each week, or as told by your health care provider. Tracking how many servings of carbohydrates you eat at each meal. Not using orange juice to treat low blood sugars. Instead, use apple juice, cranberry juice, or clear soda. You may be given guidelines on what foods and nutrients you may eat, and how much you can have each day. This depends on your stage of kidney disease and whether you have high blood pressure (hypertension). Follow the meal plan your dietitian gives you. To learn more: National Institute of Diabetes and Digestive and Kidney Diseases: niddk.nih.gov National Kidney Foundation: kidney.org Summary Chronic kidney disease (CKD) is when your kidneys are not working well. They cannot remove waste, fluids, and other substances from your blood the way they should. These substances can build up, which can worsen kidney damage and affect how your body works. Changing your diet can help prevent more kidney damage. Diet changes may also delay dialysis or even keep you from needing it. Diet changes are different for each person with CKD.   Work with a dietitian to set up a meal plan that is right for you. This information is not intended to replace advice given to you by your health care provider. Make sure you discuss any questions you have with your health care provider. Document Revised: 08/28/2021 Document Reviewed: 09/03/2019 Elsevier Patient Education  2024 Elsevier Inc.  

## 2022-12-08 ENCOUNTER — Ambulatory Visit (INDEPENDENT_AMBULATORY_CARE_PROVIDER_SITE_OTHER): Payer: Medicare Other | Admitting: Nurse Practitioner

## 2022-12-08 ENCOUNTER — Encounter: Payer: Self-pay | Admitting: Nurse Practitioner

## 2022-12-08 VITALS — BP 108/72 | HR 87 | Temp 98.6°F | Ht 69.02 in | Wt 200.6 lb

## 2022-12-08 DIAGNOSIS — D692 Other nonthrombocytopenic purpura: Secondary | ICD-10-CM

## 2022-12-08 DIAGNOSIS — N1832 Chronic kidney disease, stage 3b: Secondary | ICD-10-CM

## 2022-12-08 DIAGNOSIS — I480 Paroxysmal atrial fibrillation: Secondary | ICD-10-CM

## 2022-12-08 DIAGNOSIS — E785 Hyperlipidemia, unspecified: Secondary | ICD-10-CM | POA: Diagnosis not present

## 2022-12-08 DIAGNOSIS — E6609 Other obesity due to excess calories: Secondary | ICD-10-CM

## 2022-12-08 DIAGNOSIS — D696 Thrombocytopenia, unspecified: Secondary | ICD-10-CM | POA: Diagnosis not present

## 2022-12-08 DIAGNOSIS — I6523 Occlusion and stenosis of bilateral carotid arteries: Secondary | ICD-10-CM

## 2022-12-08 DIAGNOSIS — E039 Hypothyroidism, unspecified: Secondary | ICD-10-CM

## 2022-12-08 DIAGNOSIS — E1159 Type 2 diabetes mellitus with other circulatory complications: Secondary | ICD-10-CM | POA: Diagnosis not present

## 2022-12-08 DIAGNOSIS — I152 Hypertension secondary to endocrine disorders: Secondary | ICD-10-CM

## 2022-12-08 DIAGNOSIS — E1122 Type 2 diabetes mellitus with diabetic chronic kidney disease: Secondary | ICD-10-CM

## 2022-12-08 DIAGNOSIS — E1169 Type 2 diabetes mellitus with other specified complication: Secondary | ICD-10-CM

## 2022-12-08 LAB — BAYER DCA HB A1C WAIVED: HB A1C (BAYER DCA - WAIVED): 7.4 % — ABNORMAL HIGH (ref 4.8–5.6)

## 2022-12-08 MED ORDER — LISINOPRIL 2.5 MG PO TABS: 2.5000 mg | ORAL_TABLET | Freq: Every day | ORAL | 4 refills | Status: DC

## 2022-12-08 NOTE — Assessment & Plan Note (Signed)
Chronic, stable.  Recheck CBC every 6 months (ordered today), no current symptoms.  Previously followed by Dr. Kizzie Bane.  CBC today - for Q6MOS check = January and June.

## 2022-12-08 NOTE — Assessment & Plan Note (Signed)
Ongoing.  Noted on exam, on anticoagulant therapy for a-fib.  Recommend gentle skin care at home and monitor for wounds, if present then immediately notify provider.

## 2022-12-08 NOTE — Assessment & Plan Note (Signed)
Chronic, ongoing.  Continue current medication regimen and adjust as needed based on labs.  Thyroid labs up to date -- due next January 2025.

## 2022-12-08 NOTE — Assessment & Plan Note (Signed)
Chronic, stable.  Noted on past imaging, may benefit return to vascular in future.  Continue preventative medications.  Last imaging with cardiology September 2022, stable.

## 2022-12-08 NOTE — Assessment & Plan Note (Signed)
Chronic, ongoing.  A1c 7.4% today, trend up from 7.1%.  Urine Alb 30 (January 2024).   - Continue Ozempic 2 MG weekly + continue Metformin ER 500 MG BID -- discussed with patient may benefit in future change to [facility] or Farxiga and discontinuation Metformin due to kidney function.   Renal function limits medications that can be used + cost -- donut hole issues with Medicare.  He wishes to hold off on changes.  A1c is at goal per diabetes association and geriatric society based on age. - Recommend he check blood sugar twice a day, in morning fasting and 2 hours after a meal, document these.   - Collaborate with CCM team.   - Could consider a very low dose sulfonyurea if needed to keep cost low in future and avoid insulin.  At this time recommend heavy diet focus and regular exercise. - Return in 3 months.

## 2022-12-08 NOTE — Assessment & Plan Note (Signed)
Chronic, stable. BP well below goal in office today.  Recommend he monitor BP at least a few mornings a week at home and document.  DASH diet at home.  Continue current medication regimen and adjust as needed.  Labs today: BMP.   Refills sent in.  Return in 3 months.

## 2022-12-08 NOTE — Assessment & Plan Note (Signed)
BMI 29.61 with loss.  Recommended eating smaller high protein, low fat meals more frequently and exercising 30 mins a day 5 times a week with a goal of 10-15lb weight loss in the next 3 months. Patient voiced their understanding and motivation to adhere to these recommendations.

## 2022-12-08 NOTE — Assessment & Plan Note (Signed)
Chronic, follows with cardiology.  Rate controlled.  Continue current medication regimen as prescribed by cardiology at this time and adjust as needed.  Recent cardiology note reviewed.  Continue collaboration.

## 2022-12-08 NOTE — Progress Notes (Signed)
BP 108/72   Pulse 87   Temp 98.6 F (37 C) (Oral)   Ht 5' 9.02" (1.753 m)   Wt 200 lb 9.6 oz (91 kg)   SpO2 97%   BMI 29.61 kg/m    Subjective:    Patient ID: Jonathan [name], male    DOB: [birth year], 77 y.o.   MRN: [id number]  HPI: Jonathan [name] is a 77 y.o. male  Chief Complaint  Patient presents with   Diabetes   DIABETES A1c 7.1% in April.  Taking Ozempic 2 MG and Metformin XR 500 MG BID.    Tried Invokana, but was discontinued in past due to kidney function.  Reports stable diet at home.  Has been going for walks until the heat became worse.  Drinks coffee in morning with fruit and a meal at night.   Hypoglycemic episodes:no Polydipsia/polyuria: no Visual disturbance: no Chest pain: no Paresthesias: no Glucose Monitoring: yes             Accucheck frequency: not checking             Fasting glucose:              Post prandial:              Evening:              Before meals: Taking Insulin?: no             Long acting insulin:             Short acting insulin: Blood Pressure Monitoring: daily Retinal Examination: Up to date -- [facility] Foot Exam: Up to Date Pneumovax: Up to Date Influenza: Up to Date Aspirin: no    HYPERTENSION / HYPERLIPIDEMIA Continues on Amlodipine, Lisinopril, Metoprolol, Atorvastatin, Apixaban. Satisfied with current treatment? yes Duration of hypertension: chronic BP monitoring frequency: every day BP range: similar to BP today -- stays in that range BP medication side effects: no Duration of hyperlipidemia: chronic Cholesterol medication side effects: no Cholesterol supplements: none Medication compliance: good compliance Aspirin: no Recent stressors: no Recurrent headaches: no Visual changes: no Palpitations: no Dyspnea: no Chest pain: no Lower extremity edema: no Dizzy/lightheaded: no    CHRONIC KIDNEY DISEASE CKD status: stable Medications renally dose: yes Previous renal evaluation: yes Pneumovax:  Up to  Date Influenza Vaccine:  Up to Date   ATRIAL FIBRILLATION Last visit with cardiology was on 11/17/22 with no changes -- continues Metoprolol, Eliquis, Flecainide. Continues on Atorvastatin for HLD.     Dr. Maisie Fus from EP in 2021 about left atrial appendage closure device, but has opted not to do this. Last EF was > 55% on 02/09/21.  Last carotid doppler and stress test was on 02/09/21. Atrial fibrillation status: controlled Satisfied with current treatment: stable Medication side effects:  no Medication compliance: good compliance Etiology of atrial fibrillation:  Palpitations:  no Chest pain:  no Dyspnea on exertion:  no Orthopnea:  no Syncope:  no Edema:  no Ventricular rate control: B-blocker Anti-coagulation: long acting   HYPOTHYROIDISM Levothyroxine 125 MCG daily.   Thyroid control status:controlled Satisfied with current treatment? no Medication side effects: no Medication compliance: good compliance Etiology of hypothyroidism:  Recent dose adjustment:no Fatigue: no Cold intolerance: no Heat intolerance: no Weight gain: no Weight loss: no Constipation: no Diarrhea/loose stools: no Palpitations: no Lower extremity edema: no Anxiety/depressed mood: no   Relevant past medical, surgical, family and social history reviewed and updated as indicated. Interim medical  history since our last visit reviewed. Allergies and medications reviewed and updated.  Review of Systems  Constitutional:  Negative for activity change, diaphoresis, fatigue and fever.  Respiratory:  Negative for cough, chest tightness, shortness of breath and wheezing.   Cardiovascular:  Negative for chest pain, palpitations and leg swelling.  Gastrointestinal: Negative.   Endocrine: Negative for cold intolerance, heat intolerance, polydipsia, polyphagia and polyuria.  Neurological: Negative.   Psychiatric/Behavioral: Negative.      Per HPI unless specifically indicated above     Objective:    BP  108/72   Pulse 87   Temp 98.6 F (37 C) (Oral)   Ht 5' 9.02" (1.753 m)   Wt 200 lb 9.6 oz (91 kg)   SpO2 97%   BMI 29.61 kg/m   Wt Readings from Last 3 Encounters:  12/08/22 200 lb 9.6 oz (91 kg)  09/01/22 204 lb 4.8 oz (92.7 kg)  06/02/22 207 lb (93.9 kg)    Physical Exam Vitals and nursing note reviewed.  Constitutional:      General: He is awake. He is not in acute distress.    Appearance: He is well-developed, well-groomed and overweight. He is not ill-appearing.  HENT:     Head: Normocephalic and atraumatic.     Right Ear: Hearing normal. No drainage.     Left Ear: Hearing normal. No drainage.  Eyes:     General: Lids are normal.        Right eye: No discharge.        Left eye: No discharge.     Conjunctiva/sclera: Conjunctivae normal.     Pupils: Pupils are equal, round, and reactive to light.  Neck:     Thyroid: No thyromegaly.     Vascular: No carotid bruit.  Cardiovascular:     Rate and Rhythm: Normal rate and regular rhythm.     Heart sounds: Normal heart sounds, S1 normal and S2 normal. No murmur heard.    No gallop.  Pulmonary:     Effort: Pulmonary effort is normal. No accessory muscle usage or respiratory distress.     Breath sounds: Normal breath sounds. No decreased breath sounds, wheezing or rhonchi.  Abdominal:     General: Bowel sounds are normal. There is no distension.     Palpations: Abdomen is soft.     Tenderness: There is no abdominal tenderness.  Musculoskeletal:        General: Normal range of motion.     Cervical back: Normal range of motion and neck supple.     Right lower leg: No edema.     Left lower leg: No edema.  Lymphadenopathy:     Cervical: No cervical adenopathy.  Skin:    General: Skin is warm and dry.     Comments: Scattered bruising bilateral upper extremities.  Neurological:     Mental Status: He is alert and oriented to person, place, and time.  Psychiatric:        Attention and Perception: Attention normal.         Mood and Affect: Mood normal.        Speech: Speech normal.        Behavior: Behavior normal. Behavior is cooperative.        Thought Content: Thought content normal.    Results for orders placed or performed in visit on 09/01/22  Bayer DCA Hb A1c Waived  Result Value Ref Range   HB A1C (BAYER DCA - WAIVED) 7.1 (H) 4.8 - 5.6 %  Basic metabolic panel  Result Value Ref Range   Glucose 96 70 - 99 mg/dL   BUN 28 (H) 8 - 27 mg/dL   Creatinine, Ser 1.61 (H) 0.76 - 1.27 mg/dL   eGFR 45 (L) >09 UE/AVW/0.98   BUN/Creatinine Ratio 18 10 - 24   Sodium 139 134 - 144 mmol/L   Potassium 5.2 3.5 - 5.2 mmol/L   Chloride 101 96 - 106 mmol/L   CO2 21 20 - 29 mmol/L   Calcium 9.7 8.6 - 10.2 mg/dL  Lipid Panel w/o Chol/HDL Ratio  Result Value Ref Range   Cholesterol, Total 143 100 - 199 mg/dL   Triglycerides 119 0 - 149 mg/dL   HDL 57 >14 mg/dL   VLDL Cholesterol Cal 22 5 - 40 mg/dL   LDL Chol Calc (NIH) 64 0 - 99 mg/dL      Assessment & Plan:   Problem List Items Addressed This Visit       Cardiovascular and Mediastinum   Bilateral carotid artery stenosis    Chronic, stable.  Noted on past imaging, may benefit return to vascular in future.  Continue preventative medications.  Last imaging with cardiology September 2022, stable.      Relevant Medications   lisinopril (ZESTRIL) 2.5 MG tablet   Hypertension associated with diabetes (HCC)    Chronic, stable. BP well below goal in office today.  Recommend he monitor BP at least a few mornings a week at home and document.  DASH diet at home.  Continue current medication regimen and adjust as needed.  Labs today: BMP.   Refills sent in.  Return in 3 months.      Relevant Medications   lisinopril (ZESTRIL) 2.5 MG tablet   Other Relevant Orders   Basic metabolic panel   Bayer DCA Hb N8G Waived   Paroxysmal A-fib (HCC)    Chronic, follows with cardiology.  Rate controlled.  Continue current medication regimen as prescribed by cardiology at  this time and adjust as needed.  Recent cardiology note reviewed.  Continue collaboration.        Relevant Medications   lisinopril (ZESTRIL) 2.5 MG tablet   Senile purpura (HCC)    Ongoing.  Noted on exam, on anticoagulant therapy for a-fib.  Recommend gentle skin care at home and monitor for wounds, if present then immediately notify provider.      Relevant Medications   lisinopril (ZESTRIL) 2.5 MG tablet     Endocrine   Hyperlipidemia associated with type 2 diabetes mellitus (HCC)    Chronic, ongoing.  Continue with current regimen and adjust as needed based on labs.  Lipid panel today.      Relevant Medications   lisinopril (ZESTRIL) 2.5 MG tablet   Other Relevant Orders   Bayer DCA Hb A1c Waived   Hypothyroidism    Chronic, ongoing.  Continue current medication regimen and adjust as needed based on labs.  Thyroid labs up to date -- due next January 2025.      Type 2 diabetes mellitus with chronic kidney disease, without long-term current use of insulin (HCC) - Primary    Chronic, ongoing.  A1c 7.4% today, trend up from 7.1%.  Urine Alb 30 (January 2024).   - Continue Ozempic 2 MG weekly + continue Metformin ER 500 MG BID -- discussed with patient may benefit in future change to [facility] or Farxiga and discontinuation Metformin due to kidney function.   Renal function limits medications that can be used + cost --  donut hole issues with Medicare.  He wishes to hold off on changes.  A1c is at goal per diabetes association and geriatric society based on age. - Recommend he check blood sugar twice a day, in morning fasting and 2 hours after a meal, document these.   - Collaborate with CCM team.   - Could consider a very low dose sulfonyurea if needed to keep cost low in future and avoid insulin.  At this time recommend heavy diet focus and regular exercise. - Return in 3 months.      Relevant Medications   lisinopril (ZESTRIL) 2.5 MG tablet   Other Relevant Orders   Basic  metabolic panel   Bayer DCA Hb Z6X Waived     Genitourinary   CKD (chronic kidney disease), stage III (HCC)    Chronic, ongoing.  Continue to collaborate with nephrology as needed and renal dose medication, Lisinopril for kidney protection.  BMP today.  Urine Alb 30 (January 2024).      Relevant Orders   Basic metabolic panel     Hematopoietic and Hemostatic   Thrombocytopenia (HCC)    Chronic, stable.  Recheck CBC every 6 months (ordered today), no current symptoms.  Previously followed by Dr. Kizzie Bane.  CBC today - for Q6MOS check = January and June.      Relevant Orders   CBC with Differential/Platelet     Other   Obesity    BMI 29.61 with loss.  Recommended eating smaller high protein, low fat meals more frequently and exercising 30 mins a day 5 times a week with a goal of 10-15lb weight loss in the next 3 months. Patient voiced their understanding and motivation to adhere to these recommendations.         Follow up plan: Return in about 3 months (around 03/10/2023) for T2DM, HTN/HLD, CKD, A-FIB.

## 2022-12-08 NOTE — Assessment & Plan Note (Signed)
Chronic, ongoing.  Continue to collaborate with nephrology as needed and renal dose medication, Lisinopril for kidney protection.  BMP today.  Urine Alb 30 (January 2024).

## 2022-12-08 NOTE — Assessment & Plan Note (Signed)
Chronic, ongoing.  Continue with current regimen and adjust as needed based on labs.  Lipid panel today. 

## 2022-12-09 ENCOUNTER — Encounter: Payer: Self-pay | Admitting: Nurse Practitioner

## 2022-12-09 LAB — BASIC METABOLIC PANEL
BUN/Creatinine Ratio: 16 (ref 10–24)
BUN: 26 mg/dL (ref 8–27)
CO2: 22 mmol/L (ref 20–29)
Calcium: 9.7 mg/dL (ref 8.6–10.2)
Chloride: 100 mmol/L (ref 96–106)
Creatinine, Ser: 1.64 mg/dL — ABNORMAL HIGH (ref 0.76–1.27)
Glucose: 160 mg/dL — ABNORMAL HIGH (ref 70–99)
Potassium: 5 mmol/L (ref 3.5–5.2)
Sodium: 137 mmol/L (ref 134–144)
eGFR: 43 mL/min/{1.73_m2} — ABNORMAL LOW (ref >59)

## 2022-12-09 LAB — CBC WITH DIFFERENTIAL/PLATELET
Basophils Absolute: 0 10*3/uL (ref 0.0–0.2)
Basos: 1 %
EOS (ABSOLUTE): 0.1 10*3/uL (ref 0.0–0.4)
Eos: 2 %
Hematocrit: 40.1 % (ref 37.5–51.0)
Hemoglobin: 13.4 g/dL (ref 13.0–17.7)
Immature Grans (Abs): 0.1 10*3/uL (ref 0.0–0.1)
Immature Granulocytes: 1 %
Lymphocytes Absolute: 1.4 10*3/uL (ref 0.7–3.1)
Lymphs: 23 %
MCH: 29 pg (ref 26.6–33.0)
MCHC: 33.4 g/dL (ref 31.5–35.7)
MCV: 87 fL (ref 79–97)
Monocytes Absolute: 0.6 10*3/uL (ref 0.1–0.9)
Monocytes: 9 %
Neutrophils Absolute: 3.9 10*3/uL (ref 1.4–7.0)
Neutrophils: 64 %
Platelets: 166 10*3/uL (ref 150–450)
RBC: 4.62 x10E6/uL (ref 4.14–5.80)
RDW: 13.6 % (ref 11.6–15.4)
WBC: 6 10*3/uL (ref 3.4–10.8)

## 2022-12-09 NOTE — Progress Notes (Signed)
Contacted via MyChart   Good morning Jonathan [name], your labs have returned and overall remain at baseline for you.  Kidney function continues to show stage 3a to b kidney disease with no worsening.  Glucose (sugar) was a little elevated, which goes with your elevation in A1c.  CBC is stable.  Any questions? Keep being amazing!!  Thank you for allowing me to participate in your care.  I appreciate you. Kindest regards, Ambree Frances

## 2023-01-20 DIAGNOSIS — E119 Type 2 diabetes mellitus without complications: Secondary | ICD-10-CM | POA: Diagnosis not present

## 2023-03-04 LAB — HM DIABETES EYE EXAM

## 2023-03-10 ENCOUNTER — Ambulatory Visit: Payer: Medicare Other | Admitting: Nurse Practitioner

## 2023-03-10 ENCOUNTER — Encounter: Payer: Self-pay | Admitting: Nurse Practitioner

## 2023-03-10 VITALS — BP 112/71 | HR 82 | Temp 97.7°F | Ht 69.0 in | Wt 201.2 lb

## 2023-03-10 DIAGNOSIS — E66811 Obesity, class 1: Secondary | ICD-10-CM

## 2023-03-10 DIAGNOSIS — Z6830 Body mass index (BMI) 30.0-30.9, adult: Secondary | ICD-10-CM

## 2023-03-10 DIAGNOSIS — E1122 Type 2 diabetes mellitus with diabetic chronic kidney disease: Secondary | ICD-10-CM

## 2023-03-10 DIAGNOSIS — Z23 Encounter for immunization: Secondary | ICD-10-CM

## 2023-03-10 DIAGNOSIS — N1832 Chronic kidney disease, stage 3b: Secondary | ICD-10-CM | POA: Diagnosis not present

## 2023-03-10 DIAGNOSIS — I152 Hypertension secondary to endocrine disorders: Secondary | ICD-10-CM

## 2023-03-10 DIAGNOSIS — E1159 Type 2 diabetes mellitus with other circulatory complications: Secondary | ICD-10-CM | POA: Diagnosis not present

## 2023-03-10 DIAGNOSIS — E6609 Other obesity due to excess calories: Secondary | ICD-10-CM

## 2023-03-10 DIAGNOSIS — D692 Other nonthrombocytopenic purpura: Secondary | ICD-10-CM | POA: Diagnosis not present

## 2023-03-10 DIAGNOSIS — E785 Hyperlipidemia, unspecified: Secondary | ICD-10-CM | POA: Diagnosis not present

## 2023-03-10 DIAGNOSIS — I480 Paroxysmal atrial fibrillation: Secondary | ICD-10-CM | POA: Diagnosis not present

## 2023-03-10 DIAGNOSIS — E1169 Type 2 diabetes mellitus with other specified complication: Secondary | ICD-10-CM

## 2023-03-10 NOTE — Assessment & Plan Note (Signed)
Chronic, ongoing.  A1c 7.4% last check, trend up from 7.1%. Recheck today Urine Alb 30 (January 2024).   - Continue Ozempic 2 MG weekly + continue Metformin ER 500 MG BID -- discussed with patient may benefit in future change to [facility] or Farxiga and discontinuation Metformin due to kidney function.   Renal function limits medications that can be used + cost -- donut hole issues with Medicare.  He wishes to hold off on changes.  A1c is at goal per diabetes association and geriatric society based on age. - Recommend he check blood sugar twice a day, in morning fasting and 2 hours after a meal, document these.   - Collaborate with CCM team.   - Could consider a very low dose sulfonyurea if needed to keep cost low in future and avoid insulin.  At this time recommend heavy diet focus and regular exercise. - Return in 3 months.

## 2023-03-10 NOTE — Assessment & Plan Note (Signed)
Chronic, ongoing.  Continue with current regimen and adjust as needed based on labs.  Lipid panel today.

## 2023-03-10 NOTE — Assessment & Plan Note (Signed)
BMI 29.71 remaining stable.  Recommended eating smaller high protein, low fat meals more frequently and exercising 30 mins a day 5 times a week with a goal of 10-15lb weight loss in the next 3 months. Patient voiced their understanding and motivation to adhere to these recommendations.

## 2023-03-10 NOTE — Assessment & Plan Note (Signed)
Chronic, ongoing CKD 3b.  Continue to collaborate with nephrology as needed and renal dose medication, Lisinopril for kidney protection.  CMP today.  Urine Alb 30 (January 2024).

## 2023-03-10 NOTE — Assessment & Plan Note (Signed)
Chronic, follows with cardiology.  Rate controlled.  Continue current medication regimen as prescribed by cardiology at this time and adjust as needed.  Recent cardiology note reviewed.  Continue collaboration, appreciate their input.

## 2023-03-10 NOTE — Assessment & Plan Note (Signed)
Ongoing.  Noted on exam, on anticoagulant therapy for a-fib.  Recommend gentle skin care at home and monitor for wounds, if present then immediately notify provider.

## 2023-03-10 NOTE — Progress Notes (Signed)
BP 112/71   Pulse 82   Temp 97.7 F (36.5 C) (Oral)   Ht 5\' 9"  (1.753 m)   Wt 201 lb 3.2 oz (91.3 kg)   SpO2 98%   BMI 29.71 kg/m    Subjective:    Patient ID: Jonathan [name], male    DOB: [birth year], 77 y.o.   MRN: [id number]  HPI: Jonathan [name] is a 77 y.o. male  Chief Complaint  Patient presents with   Diabetes    Eye exam requested from [facility]   Hyperlipidemia   Hypertension   DIABETES Last A1c July was 7.4%.  Taking Ozempic 2 MG and Metformin XR 500 MG BID.  Tried Invokana, but was discontinued in past due to kidney function.  Reports stable diet at home.  Hypoglycemic episodes:yes Polydipsia/polyuria: no Visual disturbance: no Chest pain: no Paresthesias: no Glucose Monitoring: no  Accucheck frequency: Not Checking  Fasting glucose:  Post prandial:  Evening:  Before meals: Taking Insulin?: no  Long acting insulin:  Short acting insulin: Blood Pressure Monitoring: a few times a week Retinal Examination: Up to Date -- [facility] Foot Exam: Up to Date Pneumovax: Up to Date Influenza: Up to Date Aspirin: no   CHRONIC KIDNEY DISEASE (3b) Remaining in range 3b on recent labs. CKD status: stable Medications renally dose: yes Previous renal evaluation: no Pneumovax:  Up to Date Influenza Vaccine:  Up to Date   ATRIAL FIBRILLATION Last visit with cardiology was 11/17/22 with no changes.  Continues on Eliquis, Metoprolol, and Flecainide.    Dr. Maisie Fus from EP in 2021 about left atrial appendage closure device, but has opted not to do this. Last EF was > 55% on 02/09/21.  Last carotid doppler and stress test was on 02/09/21.  Atrial fibrillation status: stable Satisfied with current treatment: yes  Medication side effects:  no Medication compliance: good compliance Etiology of atrial fibrillation:  Palpitations:  no Chest pain:  no Dyspnea on exertion:  no Orthopnea:  no Syncope:  no Edema:  no Ventricular rate control:  B-blocker Anti-coagulation: long acting   HYPERTENSION / HYPERLIPIDEMIA Continues on Amlodipine + Lisinopril.   Satisfied with current treatment? yes Duration of hypertension: chronic BP monitoring frequency: a few times a week BP range: range 110-120/70 BP medication side effects: no Duration of hyperlipidemia: chronic Cholesterol medication side effects: no Cholesterol supplements: none Medication compliance: good compliance Aspirin: no Recent stressors: no Recurrent headaches: no Visual changes: no Palpitations: no Dyspnea: no Chest pain: no Lower extremity edema: no Dizzy/lightheaded: no   Relevant past medical, surgical, family and social history reviewed and updated as indicated. Interim medical history since our last visit reviewed. Allergies and medications reviewed and updated.  Review of Systems  Constitutional:  Negative for activity change, diaphoresis, fatigue and fever.  Respiratory:  Negative for cough, chest tightness, shortness of breath and wheezing.   Cardiovascular:  Negative for chest pain, palpitations and leg swelling.  Gastrointestinal: Negative.   Endocrine: Negative for cold intolerance, heat intolerance, polydipsia, polyphagia and polyuria.  Neurological: Negative.   Psychiatric/Behavioral: Negative.     Per HPI unless specifically indicated above     Objective:    BP 112/71   Pulse 82   Temp 97.7 F (36.5 C) (Oral)   Ht 5\' 9"  (1.753 m)   Wt 201 lb 3.2 oz (91.3 kg)   SpO2 98%   BMI 29.71 kg/m   Wt Readings from Last 3 Encounters:  03/10/23 201 lb 3.2 oz (91.3  kg)  12/08/22 200 lb 9.6 oz (91 kg)  09/01/22 204 lb 4.8 oz (92.7 kg)    Physical Exam Vitals and nursing note reviewed.  Constitutional:      General: He is awake. He is not in acute distress.    Appearance: He is well-developed, well-groomed and overweight. He is not ill-appearing.  HENT:     Head: Normocephalic and atraumatic.     Right Ear: Hearing normal. No drainage.      Left Ear: Hearing normal. No drainage.  Eyes:     General: Lids are normal.        Right eye: No discharge.        Left eye: No discharge.     Conjunctiva/sclera: Conjunctivae normal.     Pupils: Pupils are equal, round, and reactive to light.  Neck:     Thyroid: No thyromegaly.     Vascular: No carotid bruit.  Cardiovascular:     Rate and Rhythm: Normal rate and regular rhythm.     Heart sounds: Normal heart sounds, S1 normal and S2 normal. No murmur heard.    No gallop.  Pulmonary:     Effort: Pulmonary effort is normal. No accessory muscle usage or respiratory distress.     Breath sounds: Normal breath sounds. No decreased breath sounds, wheezing or rhonchi.  Abdominal:     General: Bowel sounds are normal. There is no distension.     Palpations: Abdomen is soft.     Tenderness: There is no abdominal tenderness.  Musculoskeletal:        General: Normal range of motion.     Cervical back: Normal range of motion and neck supple.     Right lower leg: No edema.     Left lower leg: No edema.  Lymphadenopathy:     Cervical: No cervical adenopathy.  Skin:    General: Skin is warm and dry.     Comments: Scattered bruising bilateral upper extremities.  Neurological:     Mental Status: He is alert and oriented to person, place, and time.  Psychiatric:        Attention and Perception: Attention normal.        Mood and Affect: Mood normal.        Speech: Speech normal.        Behavior: Behavior normal. Behavior is cooperative.        Thought Content: Thought content normal.    Diabetic Foot Exam - Simple   Simple Foot Form Visual Inspection See comments: Yes Sensation Testing Intact to touch and monofilament testing bilaterally: Yes Pulse Check Posterior Tibialis and Dorsalis pulse intact bilaterally: Yes Comments 10/10 sensation both feet.  Bunion present to both feet.     Results for orders placed or performed in visit on 12/08/22  Basic metabolic panel  Result  Value Ref Range   Glucose 160 (H) 70 - 99 mg/dL   BUN 26 8 - 27 mg/dL   Creatinine, Ser 8.46 (H) 0.76 - 1.27 mg/dL   eGFR 43 (L) >96 EX/BMW/4.13   BUN/Creatinine Ratio 16 10 - 24   Sodium 137 134 - 144 mmol/L   Potassium 5.0 3.5 - 5.2 mmol/L   Chloride 100 96 - 106 mmol/L   CO2 22 20 - 29 mmol/L   Calcium 9.7 8.6 - 10.2 mg/dL  Bayer DCA Hb K4M Waived  Result Value Ref Range   HB A1C (BAYER DCA - WAIVED) 7.4 (H) 4.8 - 5.6 %  CBC with Differential/Platelet  Result Value Ref Range   WBC 6.0 3.4 - 10.8 x10E3/uL   RBC 4.62 4.14 - 5.80 x10E6/uL   Hemoglobin 13.4 13.0 - 17.7 g/dL   Hematocrit 16.1 09.6 - 51.0 %   MCV 87 79 - 97 fL   MCH 29.0 26.6 - 33.0 pg   MCHC 33.4 31.5 - 35.7 g/dL   RDW 04.5 40.9 - 81.1 %   Platelets 166 150 - 450 x10E3/uL   Neutrophils 64 Not Estab. %   Lymphs 23 Not Estab. %   Monocytes 9 Not Estab. %   Eos 2 Not Estab. %   Basos 1 Not Estab. %   Neutrophils Absolute 3.9 1.4 - 7.0 x10E3/uL   Lymphocytes Absolute 1.4 0.7 - 3.1 x10E3/uL   Monocytes Absolute 0.6 0.1 - 0.9 x10E3/uL   EOS (ABSOLUTE) 0.1 0.0 - 0.4 x10E3/uL   Basophils Absolute 0.0 0.0 - 0.2 x10E3/uL   Immature Granulocytes 1 Not Estab. %   Immature Grans (Abs) 0.1 0.0 - 0.1 x10E3/uL      Assessment & Plan:   Problem List Items Addressed This Visit       Cardiovascular and Mediastinum   Hypertension associated with diabetes (HCC)    Chronic, stable. BP well below goal in office today and on home readings.  Recommend he monitor BP at least a few mornings a week at home and document.  DASH diet at home.  Continue current medication regimen and adjust as needed.  Labs today: CMP.   Refills up to date.  Return in 3 months.      Relevant Orders   Comprehensive metabolic panel   HgB A1c   Paroxysmal A-fib (HCC)    Chronic, follows with cardiology.  Rate controlled.  Continue current medication regimen as prescribed by cardiology at this time and adjust as needed.  Recent cardiology note  reviewed.  Continue collaboration, appreciate their input.        Senile purpura (HCC)    Ongoing.  Noted on exam, on anticoagulant therapy for a-fib.  Recommend gentle skin care at home and monitor for wounds, if present then immediately notify provider.        Endocrine   Hyperlipidemia associated with type 2 diabetes mellitus (HCC)    Chronic, ongoing.  Continue with current regimen and adjust as needed based on labs.  Lipid panel today.      Relevant Orders   Comprehensive metabolic panel   Lipid Panel w/o Chol/HDL Ratio   HgB A1c   Type 2 diabetes mellitus with chronic kidney disease, without long-term current use of insulin (HCC) - Primary    Chronic, ongoing.  A1c 7.4% last check, trend up from 7.1%. Recheck today Urine Alb 30 (January 2024).   - Continue Ozempic 2 MG weekly + continue Metformin ER 500 MG BID -- discussed with patient may benefit in future change to [facility] or Farxiga and discontinuation Metformin due to kidney function.   Renal function limits medications that can be used + cost -- donut hole issues with Medicare.  He wishes to hold off on changes.  A1c is at goal per diabetes association and geriatric society based on age. - Recommend he check blood sugar twice a day, in morning fasting and 2 hours after a meal, document these.   - Collaborate with CCM team.   - Could consider a very low dose sulfonyurea if needed to keep cost low in future and avoid insulin.  At this time recommend heavy diet focus and  regular exercise. - Return in 3 months.      Relevant Orders   Comprehensive metabolic panel   HgB A1c     Genitourinary   CKD (chronic kidney disease), stage III (HCC)    Chronic, ongoing CKD 3b.  Continue to collaborate with nephrology as needed and renal dose medication, Lisinopril for kidney protection.  CMP today.  Urine Alb 30 (January 2024).        Other   Obesity    BMI 29.71 remaining stable.  Recommended eating smaller high protein, low fat  meals more frequently and exercising 30 mins a day 5 times a week with a goal of 10-15lb weight loss in the next 3 months. Patient voiced their understanding and motivation to adhere to these recommendations.       Other Visit Diagnoses     Flu vaccine need       Flu vaccine in office today, educated patient.   Relevant Orders   Flu Vaccine Trivalent High Dose (Fluad) (Completed)        Follow up plan: Return in about 3 months (around 06/10/2023) for T2DM, HTN/HLD, CKD, A-FIB.

## 2023-03-10 NOTE — Patient Instructions (Signed)

## 2023-03-10 NOTE — Assessment & Plan Note (Signed)
Chronic, stable. BP well below goal in office today and on home readings.  Recommend he monitor BP at least a few mornings a week at home and document.  DASH diet at home.  Continue current medication regimen and adjust as needed.  Labs today: CMP.   Refills up to date.  Return in 3 months.

## 2023-03-11 ENCOUNTER — Encounter: Payer: Self-pay | Admitting: Nurse Practitioner

## 2023-03-11 ENCOUNTER — Other Ambulatory Visit: Payer: Self-pay | Admitting: Nurse Practitioner

## 2023-03-11 DIAGNOSIS — E875 Hyperkalemia: Secondary | ICD-10-CM

## 2023-03-11 LAB — COMPREHENSIVE METABOLIC PANEL
ALT: 11 [IU]/L (ref 0–44)
AST: 18 [IU]/L (ref 0–40)
Albumin: 4.9 g/dL — ABNORMAL HIGH (ref 3.8–4.8)
Alkaline Phosphatase: 105 [IU]/L (ref 44–121)
BUN/Creatinine Ratio: 13 (ref 10–24)
BUN: 20 mg/dL (ref 8–27)
Bilirubin Total: 0.7 mg/dL (ref 0.0–1.2)
CO2: 22 mmol/L (ref 20–29)
Calcium: 10 mg/dL (ref 8.6–10.2)
Chloride: 102 mmol/L (ref 96–106)
Creatinine, Ser: 1.57 mg/dL — ABNORMAL HIGH (ref 0.76–1.27)
Globulin, Total: 2.3 g/dL (ref 1.5–4.5)
Glucose: 121 mg/dL — ABNORMAL HIGH (ref 70–99)
Potassium: 5.3 mmol/L — ABNORMAL HIGH (ref 3.5–5.2)
Sodium: 140 mmol/L (ref 134–144)
Total Protein: 7.2 g/dL (ref 6.0–8.5)
eGFR: 45 mL/min/{1.73_m2} — ABNORMAL LOW (ref >59)

## 2023-03-11 LAB — LIPID PANEL W/O CHOL/HDL RATIO
Cholesterol, Total: 137 mg/dL (ref 100–199)
HDL: 58 mg/dL (ref >39)
LDL Chol Calc (NIH): 59 mg/dL (ref 0–99)
Triglycerides: 109 mg/dL (ref 0–149)
VLDL Cholesterol Cal: 20 mg/dL (ref 5–40)

## 2023-03-11 LAB — HEMOGLOBIN A1C
Est. average glucose Bld gHb Est-mCnc: 148 mg/dL
Hgb A1c MFr Bld: 6.8 % — ABNORMAL HIGH (ref 4.8–5.6)

## 2023-03-11 NOTE — Progress Notes (Signed)
Contacted via MyChart -- lab visit only 2 weeks   Good afternoon Jonathan [name], I come bearing good news!!  Your A1c is 6.8%, better then last check of 7.4%.  Woohoo!!  Lipid panel shows LDL at goal.  Kidney function continues to show stable chronic kidney disease stage 3b.  Potassium level is a little high, focus on reduction of potassium rich food in diet and I would like to recheck outpatient in 2 weeks.  Any questions?  Great job!! Keep being amazing!!  Thank you for allowing me to participate in your care.  I appreciate you. Kindest regards, Suhayla Chisom

## 2023-04-19 ENCOUNTER — Other Ambulatory Visit: Payer: Self-pay | Admitting: Nurse Practitioner

## 2023-04-19 NOTE — Telephone Encounter (Signed)
Requested Prescriptions  Pending Prescriptions Disp Refills   levothyroxine (SYNTHROID) 125 MCG tablet [Pharmacy Med Name: LEVOTHYROXINE 125 MCG TABLET] 90 tablet 0    Sig: TAKE 1 TABLET BY MOUTH EVERY DAY     Endocrinology:  Hypothyroid Agents Passed - 04/19/2023  1:30 AM      Passed - TSH in normal range and within 360 days    TSH  Date Value Ref Range Status  06/02/2022 1.970 0.450 - 4.500 uIU/mL Final         Passed - Valid encounter within last 12 months    Recent Outpatient Visits           1 month ago Type 2 diabetes mellitus with stage 3b chronic kidney disease, without long-term current use of insulin (HCC)   [place] [facility] [place], [place] T, NP   4 months ago Type 2 diabetes mellitus with stage 3b chronic kidney disease, without long-term current use of insulin (HCC)   [place] [facility] [place], [place] T, NP   7 months ago Type 2 diabetes mellitus with stage 3b chronic kidney disease, without long-term current use of insulin (HCC)   [place] [facility] Gabriel Cirri, NP   10 months ago Medicare annual wellness visit, subsequent   [place] [facility] Rena Lara, [place] T, NP   1 year ago Type 2 diabetes mellitus with stage 3b chronic kidney disease, without long-term current use of insulin (HCC)   [place] [place], Dorie Rank, NP       Future Appointments             In 1 month [place], Dorie Rank, NP [place] [facility]   In 3 months Willeen Niece, MD [facility]

## 2023-06-03 DIAGNOSIS — E782 Mixed hyperlipidemia: Secondary | ICD-10-CM | POA: Diagnosis not present

## 2023-06-03 DIAGNOSIS — I10 Essential (primary) hypertension: Secondary | ICD-10-CM | POA: Diagnosis not present

## 2023-06-03 DIAGNOSIS — I4892 Unspecified atrial flutter: Secondary | ICD-10-CM | POA: Diagnosis not present

## 2023-06-03 DIAGNOSIS — Z9889 Other specified postprocedural states: Secondary | ICD-10-CM | POA: Diagnosis not present

## 2023-06-03 DIAGNOSIS — Z8679 Personal history of other diseases of the circulatory system: Secondary | ICD-10-CM | POA: Diagnosis not present

## 2023-06-03 DIAGNOSIS — E785 Hyperlipidemia, unspecified: Secondary | ICD-10-CM | POA: Diagnosis not present

## 2023-06-03 DIAGNOSIS — I480 Paroxysmal atrial fibrillation: Secondary | ICD-10-CM | POA: Diagnosis not present

## 2023-06-04 NOTE — Patient Instructions (Signed)
Be Involved in Caring For Your Health:  Taking Medications When medications are taken as directed, they can greatly improve your health. But if they are not taken as prescribed, they may not work. In some cases, not taking them correctly can be harmful. To help ensure your treatment remains effective and safe, understand your medications and how to take them. Bring your medications to each visit for review by your provider.  Your lab results, notes, and after visit summary will be available on My Chart. We strongly encourage you to use this feature. If lab results are abnormal the clinic will contact you with the appropriate steps. If the clinic does not contact you assume the results are satisfactory. You can always view your results on My Chart. If you have questions regarding your health or results, please contact the clinic during office hours. You can also ask questions on My Chart.  We at [facility] are grateful that you chose [place] to provide your care. We strive to provide evidence-based and compassionate care and are always looking for feedback. If you get a survey from the clinic please complete this so we can hear your opinions.  Diabetes Mellitus and Foot Care Diabetes, also called diabetes mellitus, may cause problems with your feet and legs because of poor blood flow (circulation). Poor circulation may make your skin: Become thinner and drier. Break more easily. Heal more slowly. Peel and crack. You may also have nerve damage (neuropathy). This can cause decreased feeling in your legs and feet. This means that you may not notice minor injuries to your feet that could lead to more serious problems. Finding and treating problems early is the best way to prevent future foot problems. How to care for your feet Foot hygiene  Wash your feet daily with warm water and mild soap. Do not use hot water. Then, pat your feet and the areas between your toes until they are fully dry. Do  not soak your feet. This can dry your skin. Trim your toenails straight across. Do not dig under them or around the cuticle. File the edges of your nails with an emery board or nail file. Apply a moisturizing lotion or petroleum jelly to the skin on your feet and to dry, brittle toenails. Use lotion that does not contain alcohol and is unscented. Do not apply lotion between your toes. Shoes and socks Wear clean socks or stockings every day. Make sure they are not too tight. Do not wear knee-high stockings. These may decrease blood flow to your legs. Wear shoes that fit well and have enough cushioning. Always look in your shoes before you put them on to be sure there are no objects inside. To break in new shoes, wear them for just a few hours a day. This prevents injuries on your feet. Wounds, scrapes, corns, and calluses  Check your feet daily for blisters, cuts, bruises, sores, and redness. If you cannot see the bottom of your feet, use a mirror or ask someone for help. Do not cut off corns or calluses or try to remove them with medicine. If you find a minor scrape, cut, or break in the skin on your feet, keep it and the skin around it clean and dry. You may clean these areas with mild soap and water. Do not clean the area with peroxide, alcohol, or iodine. If you have a wound, scrape, corn, or callus on your foot, look at it several times a day to make sure it  is healing and not infected. Check for: Redness, swelling, or pain. Fluid or blood. Warmth. Pus or a bad smell. General tips Do not cross your legs. This may decrease blood flow to your feet. Do not use heating pads or hot water bottles on your feet. They may burn your skin. If you have lost feeling in your feet or legs, you may not know this is happening until it is too late. Protect your feet from hot and cold by wearing shoes, such as at the beach or on hot pavement. Schedule a complete foot exam at least once a year or more often if  you have foot problems. Report any cuts, sores, or bruises to your health care provider right away. Where to find more information American Diabetes Association: diabetes.org Association of Diabetes Care & Education Specialists: diabeteseducator.org Contact a health care provider if: You have a condition that increases your risk of infection, and you have any cuts, sores, or bruises on your feet. You have an injury that is not healing. You have redness on your legs or feet. You feel burning or tingling in your legs or feet. You have pain or cramps in your legs and feet. Your legs or feet are numb. Your feet always feel cold. You have pain around any toenails. Get help right away if: You have a wound, scrape, corn, or callus on your foot and: You have signs of infection. You have a fever. You have a red line going up your leg. This information is not intended to replace advice given to you by your health care provider. Make sure you discuss any questions you have with your health care provider. Document Revised: 11/11/2021 Document Reviewed: 11/11/2021 Elsevier Patient Education  2024 [employer].

## 2023-06-07 ENCOUNTER — Ambulatory Visit: Payer: Medicare Other | Admitting: Emergency Medicine

## 2023-06-07 VITALS — Ht 70.0 in | Wt 200.0 lb

## 2023-06-07 DIAGNOSIS — Z0000 Encounter for general adult medical examination without abnormal findings: Secondary | ICD-10-CM

## 2023-06-07 NOTE — Progress Notes (Signed)
 Subjective:   Jonathan [name] is a 78 y.o. male who presents for Medicare Annual/Subsequent preventive examination.  Visit Complete: Virtual I connected with  Jonathan [name] on 06/07/23 by a video and audio enabled telemedicine application and verified that I am speaking with the correct person using two identifiers.  Patient Location: Home  Provider Location: Home Office  I discussed the limitations of evaluation and management by telemedicine. The patient expressed understanding and agreed to proceed.  Vital Signs: Because this visit was a virtual/telehealth visit, some criteria may be missing or patient reported. Any vitals not documented were not able to be obtained and vitals that have been documented are patient reported.  Patient Medicare AWV questionnaire was completed by the patient on 06/03/23; I have confirmed that all information answered by patient is correct and no changes since this date.  Cardiac Risk Factors include: advanced age (>59men, >84 women);male gender;hypertension;dyslipidemia;diabetes mellitus     Objective:    Today's Vitals   06/07/23 1121  Weight: 200 lb (90.7 kg)  Height: 5' 10 (1.778 m)   Body mass index is 28.7 kg/m.     06/07/2023   11:37 AM 06/01/2021    8:22 AM 05/30/2020    8:17 AM 05/21/2019    9:38 AM 05/18/2018    9:25 AM 05/06/2017    9:39 AM 08/13/2016   10:43 AM  Advanced Directives  Does Patient Have a Medical Advance Directive? Yes Yes Yes Yes  Yes Yes  Type of Estate Agent of [place];Living will Healthcare Power of [place] of [place];Living will Living will;Healthcare Power of [employer] Power of [place];Living will Healthcare Power of [place];Living will Healthcare Power of [place];Living will  Does patient want to make changes to medical advance directive? No - Patient declined    No - Patient declined    Copy of Healthcare Power of Attorney in Chart? No - copy requested Yes -  validated most recent copy scanned in chart (See row information) No - copy requested Yes - validated most recent copy scanned in chart (See row information) No - copy requested No - copy requested No - copy requested    Current Medications (verified) Outpatient Encounter Medications as of 06/07/2023  Medication Sig   amLODipine (NORVASC) 5 MG tablet Take 5 mg by mouth daily.   apixaban (ELIQUIS) 5 MG TABS tablet Take 5 mg by mouth in the morning and at bedtime.    atorvastatin  (LIPITOR) 80 MG tablet Take 1 tablet (80 mg total) by mouth daily.   cholecalciferol (VITAMIN D) 1000 units tablet Take 1,000 Units by mouth daily.   flecainide (TAMBOCOR) 50 MG tablet Take 2 tablets by mouth 2 (two) times daily.   levothyroxine  (SYNTHROID ) 125 MCG tablet TAKE 1 TABLET BY MOUTH EVERY DAY   magnesium  oxide (MAG-OX) 400 MG tablet Take 1 tablet (400 mg total) by mouth daily.   metFORMIN  (GLUCOPHAGE -XR) 500 MG 24 hr tablet Take 1 tablet (500 mg total) by mouth 2 (two) times daily.   metoprolol  tartrate (LOPRESSOR ) 25 MG tablet Take 1 tablet by mouth 2 (two) times daily.   Multiple Vitamin (MULTIVITAMIN) tablet Take 1 tablet by mouth daily.   ONE TOUCH ULTRA TEST test strip USE TO CHECK SUGAR TWICE DAILY   Semaglutide , 2 MG/DOSE, 8 MG/3ML SOPN Inject 2 mg as directed once a week.   vitamin B-12 (CYANOCOBALAMIN ) 1000 MCG tablet Take 1,000 mcg by mouth daily. Every other day   lisinopril  (ZESTRIL ) 2.5 MG  tablet Take 1 tablet (2.5 mg total) by mouth daily. (Patient not taking: Reported on 06/07/2023)   No facility-administered encounter medications on file as of 06/07/2023.    Allergies (verified) Patient has no known allergies.   History: Past Medical History:  Diagnosis Date   Diabetes mellitus without complication (HCC)    Hyperlipidemia    Hypertension    Lymphopenia    Thrombocytopenia (HCC)    Past Surgical History:  Procedure Laterality Date   CARDIOVERSION N/A 11/22/2019   Procedure:  CARDIOVERSION;  Surgeon: Hester Wolm PARAS, MD;  Location: [facility] ORS;  Service: Cardiovascular;  Laterality: N/A;   heart ablation     KNEE SURGERY Bilateral    Family History  Problem Relation Age of Onset   Hypertension Mother    Lung cancer Father        lung   Lung disease Father    Hypertension Father    Cancer Sister        breast   Stomach cancer Sister    Healthy Daughter    Social History   Socioeconomic History   Marital status: Married    Spouse name: Jonathan [name]   Number of children: 1   Years of education: Not on file   Highest education level: Some college, no degree  Occupational History   Occupation: retired  Tobacco Use   Smoking status: Former    Current packs/day: 0.00    Average packs/day: 1 pack/day for 13.8 years (13.8 ttl pk-yrs)    Types: Cigarettes    Start date: 1966    Quit date: 03/06/1978    Years since quitting: 45.2   Smokeless tobacco: Former    Quit date: 03/21/2007  Vaping Use   Vaping status: Never Used  Substance and Sexual Activity   Alcohol use: Yes    Alcohol/week: 2.0 standard drinks of alcohol    Types: 2 Shots of liquor per week    Comment: approx. mixed drink 1-2x week   Drug use: No   Sexual activity: Yes  Other Topics Concern   Not on file  Social History Narrative   Not on file   Social Drivers of Health   Financial Resource Strain: Low Risk  (06/07/2023)   Overall Financial Resource Strain (CARDIA)    Difficulty of Paying Living Expenses: Not very hard  Food Insecurity: No Food Insecurity (06/07/2023)   Hunger Vital Sign    Worried About Running Out of Food in the Last Year: Never true    Ran Out of Food in the Last Year: Never true  Transportation Needs: No Transportation Needs (06/07/2023)   PRAPARE - Administrator, Civil Service (Medical): No    Lack of Transportation (Non-Medical): No  Physical Activity: Insufficiently Active (06/07/2023)   Exercise Vital Sign    Days of Exercise per Week: 3 days     Minutes of Exercise per Session: 40 min  Stress: Stress Concern Present (06/07/2023)   [employer] of Occupational Health - Occupational Stress Questionnaire    Feeling of Stress : To some extent  Social Connections: Moderately Integrated (06/07/2023)   Social Connection and Isolation Panel [NHANES]    Frequency of Communication with Friends and Family: More than three times a week    Frequency of Social Gatherings with Friends and Family: More than three times a week    Attends Religious Services: More than 4 times per year    Active Member of [employer] or Organizations: No    Attends [employer]  or Organization Meetings: Never    Marital Status: Married    Tobacco Counseling Counseling given: Not Answered   Clinical Intake:  Pre-visit preparation completed: Yes  Pain : No/denies pain     BMI - recorded: 28.7 Nutritional Status: BMI 25 -29 Overweight Diabetes: Yes CBG done?: No Did pt. bring in CBG monitor from home?: No  How often do you need to have someone help you when you read instructions, pamphlets, or other written materials from your doctor or pharmacy?: 1 - Never  Interpreter Needed?: No  Information entered by :: Vina Ned, CMA   Activities of Daily Living    06/07/2023   11:26 AM 06/03/2023    1:02 PM  In your present state of health, do you have any difficulty performing the following activities:  Hearing? 1 1  Comment has hearing aids, but doesn't wear all the time   Vision? 0 0  Difficulty concentrating or making decisions? 0 0  Walking or climbing stairs? 0 0  Dressing or bathing? 0 0  Doing errands, shopping? 0 0  Preparing Food and eating ? N N  Using the Toilet? N N  In the past six months, have you accidently leaked urine? N N  Do you have problems with loss of bowel control? N N  Managing your Medications? N N  Managing your Finances? N N  Housekeeping or managing your Housekeeping? N N    Patient Care Team: Cannady, Jolene T, NP as PCP -  General (Nurse Practitioner) Hester Wolm PARAS, MD as Consulting Physician (Cardiology) Douglas Rule, MD as Consulting Physician (Nephrology)  Indicate any recent Medical Services you may have received from other than [facility] providers in the past year (date may be approximate).     Assessment:   This is a routine wellness examination for [place].  Hearing/Vision screen Hearing Screening - Comments:: Has hearing aids, but doesn't wear all the time Vision Screening - Comments:: Gets eye exams   Goals Addressed             This Visit's Progress    Patient Stated       Get cardioversion scheduled      Depression Screen    06/07/2023   11:35 AM 03/10/2023    9:46 AM 12/08/2022    8:38 AM 09/01/2022    3:01 PM 06/02/2022    9:11 AM 03/02/2022    9:49 AM 11/30/2021    9:54 AM  PHQ 2/9 Scores  PHQ - 2 Score 1 1 1  0 0 0 0  PHQ- 9 Score  2 2 1 3 1 1     Fall Risk    06/07/2023   11:38 AM 06/03/2023    1:02 PM 03/10/2023    9:46 AM 12/08/2022    8:38 AM 09/01/2022    3:01 PM  Fall Risk   Falls in the past year? 0 1 0 0 0  Number falls in past yr: 0 0 0 0 0  Injury with Fall? 0 0 0 0 0  Risk for fall due to : No Fall Risks  No Fall Risks No Fall Risks No Fall Risks  Follow up Falls prevention discussed  Falls evaluation completed Falls evaluation completed Falls evaluation completed    MEDICARE RISK AT HOME: Medicare Risk at Home Any stairs in or around the home?: (Patient-Rptd) Yes If so, are there any without handrails?: (Patient-Rptd) Yes Home free of loose throw rugs in walkways, pet beds, electrical cords, etc?: (Patient-Rptd) Yes Adequate  lighting in your home to reduce risk of falls?: (Patient-Rptd) Yes Life alert?: (Patient-Rptd) No Use of a cane, walker or w/c?: (Patient-Rptd) No Grab bars in the bathroom?: (Patient-Rptd) No Shower chair or bench in shower?: (Patient-Rptd) No Elevated toilet seat or a handicapped toilet?: (Patient-Rptd) No  TIMED UP AND GO:  Was  the test performed?  No    Cognitive Function:        06/07/2023   11:39 AM 06/02/2022    9:13 AM 05/30/2020    8:19 AM 05/18/2018    9:25 AM 05/06/2017    9:42 AM  6CIT Screen  What Year? 0 points 0 points 0 points 0 points 0 points  What month? 0 points 0 points 0 points 0 points 0 points  What time? 0 points 0 points 0 points 0 points 0 points  Count back from 20 0 points 0 points 2 points 0 points 0 points  Months in reverse 0 points 0 points 0 points 0 points 0 points  Repeat phrase 0 points 0 points 2 points 0 points 0 points  Total Score 0 points 0 points 4 points 0 points 0 points    Immunizations Immunization History  Administered Date(s) Administered   Fluad Quad(high Dose 65+) 03/12/2020, 02/12/2021, 03/02/2022   Fluad Trivalent(High Dose 65+) 03/10/2023   Influenza, High Dose Seasonal PF 01/17/2016, 01/16/2017, 01/03/2019   Influenza-Unspecified 02/17/2015, 01/06/2018   PFIZER(Purple Top)SARS-COV-2 Vaccination 08/30/2019, 09/25/2019, 04/21/2020   Pneumococcal Conjugate-13 09/18/2013, 10/10/2017   Pneumococcal Polysaccharide-23 05/21/2014, 12/18/2018   Rsv, Bivalent, Protein Subunit Rsvpref,pf [place]) 03/10/2023   Td 03/27/2007   Tdap 09/18/2013, 12/14/2018   Zoster Recombinant(Shingrix) 09/17/2017, 01/06/2018   Zoster, Live 03/15/2012    TDAP status: Up to date  Flu Vaccine status: Up to date  Pneumococcal vaccine status: Up to date  Covid-19 vaccine status: Declined, Education has been provided regarding the importance of this vaccine but patient still declined. Advised may receive this vaccine at local pharmacy or Health Dept.or vaccine clinic. Aware to provide a copy of the vaccination record if obtained from local pharmacy or Health Dept. Verbalized acceptance and understanding.  Qualifies for Shingles Vaccine? Yes   Zostavax completed Yes   Shingrix Completed?: Yes  Screening Tests Health Maintenance  Topic Date Due   Diabetic kidney evaluation -  Urine ACR  06/03/2023   COVID-19 Vaccine (4 - 2024-25 season) 06/20/2023 (Originally 01/23/2023)   HEMOGLOBIN A1C  09/08/2023   OPHTHALMOLOGY EXAM  03/03/2024   Diabetic kidney evaluation - eGFR measurement  03/09/2024   FOOT EXAM  03/09/2024   Medicare Annual Wellness (AWV)  06/06/2024   DTaP/Tdap/Td (4 - Td or Tdap) 12/13/2028   Pneumonia Vaccine 64+ Years old  Completed   INFLUENZA VACCINE  Completed   Hepatitis C Screening  Completed   Zoster Vaccines- Shingrix  Completed   HPV VACCINES  Aged Out   Fecal DNA (Cologuard)  Discontinued    Health Maintenance  Health Maintenance Due  Topic Date Due   Diabetic kidney evaluation - Urine ACR  06/03/2023    Colorectal cancer screening: No longer required.   Lung Cancer Screening: (Low Dose CT Chest recommended if Age 24-80 years, 20 pack-year currently smoking OR have quit w/in 15years.) does not qualify.   Lung Cancer Screening Referral: n/a  Additional Screening:  Hepatitis C Screening: does not qualify; Completed 09/02/14  Vision Screening: Recommended annual ophthalmology exams for early detection of glaucoma and other disorders of the eye. Is the patient up to date  with their annual eye exam?  Yes  Who is the provider or what is the name of the office in which the patient attends annual eye exams? [facility] If pt is not established with a provider, would they like to be referred to a provider to establish care? No .   Dental Screening: Recommended annual dental exams for proper oral hygiene  Diabetic Foot Exam: Diabetic Foot Exam: Completed 03/10/23  Community Resource Referral / Chronic Care Management: CRR required this visit?  No   CCM required this visit?  No     Plan:     I have personally reviewed and noted the following in the patient's chart:   Medical and social history Use of alcohol, tobacco or illicit drugs  Current medications and supplements including opioid prescriptions. Patient is not  currently taking opioid prescriptions. Functional ability and status Nutritional status Physical activity Advanced directives List of other physicians Hospitalizations, surgeries, and ER visits in previous 12 months Vitals Screenings to include cognitive, depression, and falls Referrals and appointments  In addition, I have reviewed and discussed with patient certain preventive protocols, quality metrics, and best practice recommendations. A written personalized care plan for preventive services as well as general preventive health recommendations were provided to patient.     Vina Ned, CMA   06/07/2023   After Visit Summary: (MyChart) Due to this being a telephonic visit, the after visit summary with patients personalized plan was offered to patient via MyChart   Nurse Notes:  Declined DM & Nutrition education Declined covid vaccine

## 2023-06-07 NOTE — Patient Instructions (Addendum)
 Mr. Rackers , Thank you for taking time to come for your Medicare Wellness Visit. I appreciate your ongoing commitment to your health goals. Please review the following plan we discussed and let me know if I can assist you in the future.   Referrals/Orders/Follow-Ups/Clinician Recommendations: None. Keep up the good work!  This is a list of the screening recommended for you and due dates:  Health Maintenance  Topic Date Due   Yearly kidney health urinalysis for diabetes  06/03/2023   COVID-19 Vaccine (4 - 2024-25 season) 06/20/2023*   Hemoglobin A1C  09/08/2023   Eye exam for diabetics  03/03/2024   Yearly kidney function blood test for diabetes  03/09/2024   Complete foot exam   03/09/2024   Medicare Annual Wellness Visit  06/06/2024   DTaP/Tdap/Td vaccine (4 - Td or Tdap) 12/13/2028   Pneumonia Vaccine  Completed   Flu Shot  Completed   Hepatitis C Screening  Completed   Zoster (Shingles) Vaccine  Completed   HPV Vaccine  Aged Out   Cologuard (Stool DNA test)  Discontinued  *Topic was postponed. The date shown is not the original due date.    Advanced directives: (Copy Requested) Please bring a copy of your health care power of attorney and living will to the office to be added to your chart at your convenience.  Next Medicare Annual Wellness Visit scheduled for next year: Yes, 06/12/24 @ 11:20am (video visit)

## 2023-06-10 ENCOUNTER — Ambulatory Visit (INDEPENDENT_AMBULATORY_CARE_PROVIDER_SITE_OTHER): Payer: Medicare Other | Admitting: Nurse Practitioner

## 2023-06-10 ENCOUNTER — Telehealth: Payer: Self-pay | Admitting: Nurse Practitioner

## 2023-06-10 ENCOUNTER — Encounter: Payer: Self-pay | Admitting: Nurse Practitioner

## 2023-06-10 VITALS — BP 110/73 | HR 98 | Temp 97.7°F | Ht 69.0 in | Wt 197.0 lb

## 2023-06-10 DIAGNOSIS — E039 Hypothyroidism, unspecified: Secondary | ICD-10-CM

## 2023-06-10 DIAGNOSIS — D696 Thrombocytopenia, unspecified: Secondary | ICD-10-CM

## 2023-06-10 DIAGNOSIS — I480 Paroxysmal atrial fibrillation: Secondary | ICD-10-CM

## 2023-06-10 DIAGNOSIS — Z7984 Long term (current) use of oral hypoglycemic drugs: Secondary | ICD-10-CM

## 2023-06-10 DIAGNOSIS — E1159 Type 2 diabetes mellitus with other circulatory complications: Secondary | ICD-10-CM

## 2023-06-10 DIAGNOSIS — I152 Hypertension secondary to endocrine disorders: Secondary | ICD-10-CM

## 2023-06-10 DIAGNOSIS — E785 Hyperlipidemia, unspecified: Secondary | ICD-10-CM | POA: Diagnosis not present

## 2023-06-10 DIAGNOSIS — D692 Other nonthrombocytopenic purpura: Secondary | ICD-10-CM | POA: Diagnosis not present

## 2023-06-10 DIAGNOSIS — E1122 Type 2 diabetes mellitus with diabetic chronic kidney disease: Secondary | ICD-10-CM

## 2023-06-10 DIAGNOSIS — E1169 Type 2 diabetes mellitus with other specified complication: Secondary | ICD-10-CM | POA: Diagnosis not present

## 2023-06-10 DIAGNOSIS — N1832 Chronic kidney disease, stage 3b: Secondary | ICD-10-CM

## 2023-06-10 DIAGNOSIS — I6523 Occlusion and stenosis of bilateral carotid arteries: Secondary | ICD-10-CM

## 2023-06-10 DIAGNOSIS — Z7985 Long-term (current) use of injectable non-insulin antidiabetic drugs: Secondary | ICD-10-CM | POA: Insufficient documentation

## 2023-06-10 DIAGNOSIS — E119 Type 2 diabetes mellitus without complications: Secondary | ICD-10-CM | POA: Diagnosis not present

## 2023-06-10 DIAGNOSIS — E66811 Obesity, class 1: Secondary | ICD-10-CM

## 2023-06-10 LAB — MICROALBUMIN, URINE WAIVED
Creatinine, Urine Waived: 300 mg/dL (ref 10–300)
Microalb, Ur Waived: 80 mg/L — ABNORMAL HIGH (ref 0–19)

## 2023-06-10 LAB — BAYER DCA HB A1C WAIVED: HB A1C (BAYER DCA - WAIVED): 7 % — ABNORMAL HIGH (ref 4.8–5.6)

## 2023-06-10 MED ORDER — LEVOTHYROXINE SODIUM 125 MCG PO TABS: 125.0000 ug | ORAL_TABLET | Freq: Every day | ORAL | 4 refills | Status: DC

## 2023-06-10 MED ORDER — ATORVASTATIN CALCIUM 80 MG PO TABS: 80.0000 mg | ORAL_TABLET | Freq: Every day | ORAL | 4 refills | Status: AC

## 2023-06-10 MED ORDER — METFORMIN HCL ER 500 MG PO TB24: 500.0000 mg | ORAL_TABLET | Freq: Two times a day (BID) | ORAL | 4 refills | Status: AC

## 2023-06-10 NOTE — Assessment & Plan Note (Signed)
Ongoing.  Noted on exam, on anticoagulant therapy for a-fib.  Recommend gentle skin care at home and monitor for wounds, if present then immediately notify provider.

## 2023-06-10 NOTE — Progress Notes (Signed)
BP 110/73   Pulse 98   Temp 97.7 F (36.5 C) (Oral)   Ht 5\' 9"  (1.753 m)   Wt 197 lb (89.4 kg)   SpO2 98%   BMI 29.09 kg/m    Subjective:    Patient ID: Jonathan [name], male    DOB: 1946-01-12, 78 y.o.   MRN: [id number]  HPI: Jonathan [name] is a 78 y.o. male  Chief Complaint  Patient presents with   Diabetes   Congestive Heart Failure   Hyperlipidemia   Hypertension   DIABETES A1c 6.8% October.  Taking Ozempic 2 MG and Metformin XR 500 MG BID.  Reports over the holidays has a cold, this "knocked heart out of rhythm". Has been trying to watch what he eats over the holidays.  Tried Invokana, but was discontinued in past due to kidney function.   Hypoglycemic episodes:yes Polydipsia/polyuria: no Visual disturbance: no Chest pain: no Paresthesias: no Glucose Monitoring: no  Accucheck frequency: Not Checking  Fasting glucose:  Post prandial:  Evening:  Before meals: Taking Insulin?: no  Long acting insulin:  Short acting insulin: Blood Pressure Monitoring: a few times a week Retinal Examination: Up to Date -- [facility] Foot Exam: Up to Date Pneumovax: Up to Date Influenza: Up to Date Aspirin: no   CHRONIC KIDNEY DISEASE (3b) Remains 3b on recent labs. CKD status: stable Medications renally dose: yes Previous renal evaluation: no Pneumovax:  Up to Date Influenza Vaccine:  Up to Date   ATRIAL FIBRILLATION Continues Eliquis, Metoprolol, and Flecainide.   History: Dr. Maisie Fus from EP seen in 2021 about left atrial appendage closure device ([employer]), but opted not to do this. Last EF was > 55% on 02/09/21.  Last carotid doppler and stress test was on 02/09/21.  Atrial fibrillation status: stable Satisfied with current treatment: yes  Medication side effects:  no Medication compliance: good compliance Etiology of atrial fibrillation:  Palpitations:  no Chest pain:  no Dyspnea on exertion:  no Orthopnea:  no Syncope:  no Edema:  no Ventricular rate  control: B-blocker Anti-coagulation: long acting   HYPERTENSION / HYPERLIPIDEMIA Continues to take Amlodipine. Lisinopril stopped at present per cardiology due to recent elevation in K+ at 5.7.  Occasional low platelets on labs. Satisfied with current treatment? yes Duration of hypertension: chronic BP monitoring frequency: a few times a week BP range: range 120/70 BP medication side effects: no Duration of hyperlipidemia: chronic Cholesterol medication side effects: no Cholesterol supplements: none Medication compliance: good compliance Aspirin: no Recent stressors: no Recurrent headaches: no Visual changes: no Palpitations: no Dyspnea: no Chest pain: no Lower extremity edema: no Dizzy/lightheaded: no   HYPOTHYROIDISM Continues Levothyroxine 125 MCG. Thyroid control status:stable Satisfied with current treatment? yes Medication side effects: no Medication compliance: good compliance Etiology of hypothyroidism: unknown Recent dose adjustment:no Fatigue: no Cold intolerance: no Heat intolerance: no Weight gain: no Weight loss: no Constipation: no Diarrhea/loose stools: no Palpitations: no Lower extremity edema: no Anxiety/depressed mood: no   Relevant past medical, surgical, family and social history reviewed and updated as indicated. Interim medical history since our last visit reviewed. Allergies and medications reviewed and updated.  Review of Systems  Constitutional:  Negative for activity change, diaphoresis, fatigue and fever.  Respiratory:  Negative for cough, chest tightness, shortness of breath and wheezing.   Cardiovascular:  Negative for chest pain, palpitations and leg swelling.  Gastrointestinal: Negative.   Endocrine: Negative for cold intolerance, heat intolerance, polydipsia, polyphagia and polyuria.  Neurological: Negative.  Psychiatric/Behavioral: Negative.     Per HPI unless specifically indicated above     Objective:    BP 110/73    Pulse 98   Temp 97.7 F (36.5 C) (Oral)   Ht 5\' 9"  (1.753 m)   Wt 197 lb (89.4 kg)   SpO2 98%   BMI 29.09 kg/m   Wt Readings from Last 3 Encounters:  06/10/23 197 lb (89.4 kg)  06/07/23 200 lb (90.7 kg)  03/10/23 201 lb 3.2 oz (91.3 kg)    Physical Exam Vitals and nursing note reviewed.  Constitutional:      General: He is awake. He is not in acute distress.    Appearance: He is well-developed, well-groomed and overweight. He is not ill-appearing.  HENT:     Head: Normocephalic and atraumatic.     Right Ear: Hearing normal. No drainage.     Left Ear: Hearing normal. No drainage.  Eyes:     General: Lids are normal.        Right eye: No discharge.        Left eye: No discharge.     Conjunctiva/sclera: Conjunctivae normal.     Pupils: Pupils are equal, round, and reactive to light.  Neck:     Thyroid: No thyromegaly.     Vascular: No carotid bruit.  Cardiovascular:     Rate and Rhythm: Normal rate. Rhythm irregularly irregular.     Heart sounds: Normal heart sounds, S1 normal and S2 normal. No murmur heard.    No gallop.  Pulmonary:     Effort: Pulmonary effort is normal. No accessory muscle usage or respiratory distress.     Breath sounds: Normal breath sounds. No decreased breath sounds, wheezing or rhonchi.  Abdominal:     General: Bowel sounds are normal. There is no distension.     Palpations: Abdomen is soft.     Tenderness: There is no abdominal tenderness.  Musculoskeletal:        General: Normal range of motion.     Cervical back: Normal range of motion and neck supple.     Right lower leg: No edema.     Left lower leg: No edema.  Lymphadenopathy:     Cervical: No cervical adenopathy.  Skin:    General: Skin is warm and dry.     Comments: Scattered bruising bilateral upper extremities.  Neurological:     Mental Status: He is alert and oriented to person, place, and time.  Psychiatric:        Attention and Perception: Attention normal.        Mood and  Affect: Mood normal.        Speech: Speech normal.        Behavior: Behavior normal. Behavior is cooperative.        Thought Content: Thought content normal.    Results for orders placed or performed in visit on 03/10/23  HM DIABETES EYE EXAM   Collection Time: 03/04/23 12:00 AM  Result Value Ref Range   HM Diabetic Eye Exam No Retinopathy No Retinopathy      Assessment & Plan:   Problem List Items Addressed This Visit       Cardiovascular and Mediastinum   Bilateral carotid artery stenosis   Chronic, stable.  Noted on past imaging, may benefit return to vascular in future.  Continue preventative medications.  Last imaging with cardiology September 2022, stable.  Consider repeat this year.      Relevant Medications   flecainide (TAMBOCOR)  50 MG tablet   metoprolol tartrate (LOPRESSOR) 25 MG tablet   atorvastatin (LIPITOR) 80 MG tablet   Hypertension associated with diabetes (HCC)   Chronic, stable. BP below goal in office today and on home readings.  Recommend he monitor BP at least a few mornings a week at home and document.  DASH diet at home.  Continue current medication regimen and adjust as needed.  Labs today: CMP.   Refills sent in.      Relevant Medications   flecainide (TAMBOCOR) 50 MG tablet   metoprolol tartrate (LOPRESSOR) 25 MG tablet   atorvastatin (LIPITOR) 80 MG tablet   metFORMIN (GLUCOPHAGE-XR) 500 MG 24 hr tablet   Other Relevant Orders   Bayer DCA Hb A1c Waived   Microalbumin, Urine Waived   Comprehensive metabolic panel   Paroxysmal A-fib (HCC)   Chronic, follows with cardiology.  Continue current medication regimen as prescribed by cardiology at this time and adjust as needed.  Recent cardiology note reviewed.  Continue collaboration, appreciate their input.        Relevant Medications   flecainide (TAMBOCOR) 50 MG tablet   metoprolol tartrate (LOPRESSOR) 25 MG tablet   atorvastatin (LIPITOR) 80 MG tablet   Senile purpura (HCC)   Ongoing.  Noted  on exam, on anticoagulant therapy for a-fib.  Recommend gentle skin care at home and monitor for wounds, if present then immediately notify provider.      Relevant Medications   flecainide (TAMBOCOR) 50 MG tablet   metoprolol tartrate (LOPRESSOR) 25 MG tablet   atorvastatin (LIPITOR) 80 MG tablet   Other Relevant Orders   CBC with Differential/Platelet     Endocrine   Diabetes mellitus treated with oral medication (HCC)   Refer to diabetes with CKD plan of care.      Relevant Medications   atorvastatin (LIPITOR) 80 MG tablet   metFORMIN (GLUCOPHAGE-XR) 500 MG 24 hr tablet   Hyperlipidemia associated with type 2 diabetes mellitus (HCC)   Chronic, ongoing.  Continue with current regimen and adjust as needed based on labs.  Lipid panel today.      Relevant Medications   flecainide (TAMBOCOR) 50 MG tablet   metoprolol tartrate (LOPRESSOR) 25 MG tablet   atorvastatin (LIPITOR) 80 MG tablet   metFORMIN (GLUCOPHAGE-XR) 500 MG 24 hr tablet   Other Relevant Orders   Bayer DCA Hb A1c Waived   Comprehensive metabolic panel   Lipid Panel w/o Chol/HDL Ratio   Hypothyroidism   Chronic, ongoing.  Continue current medication regimen and adjust as needed based on labs.  Thyroid labs today.      Relevant Medications   metoprolol tartrate (LOPRESSOR) 25 MG tablet   levothyroxine (SYNTHROID) 125 MCG tablet   Other Relevant Orders   T4, free   TSH   Type 2 diabetes mellitus with chronic kidney disease, without long-term current use of insulin (HCC) - Primary   Chronic, ongoing.  A1c 7% today, slight trend up over holidays. Urine Alb 80 (January 2025).   - Continue Ozempic 2 MG weekly + continue Metformin ER 500 MG BID -- discussed with patient may benefit in future change to [facility] or Farxiga and discontinuation Metformin due to kidney function in future.   Renal function limits medications that can be used + cost -- donut hole issues with Medicare.  He wishes to hold off on changes.  A1c is  at goal per diabetes association and geriatric society based on age. - Recommend he check blood sugar twice a  day, in morning fasting and 2 hours after a meal, document these.   - Collaborate with PharmD Elnita Maxwell. - Could consider a very low dose sulfonyurea if needed to keep cost low in future and avoid insulin.  At this time recommend heavy diet focus and regular exercise. - Return in 3 months.      Relevant Medications   atorvastatin (LIPITOR) 80 MG tablet   metFORMIN (GLUCOPHAGE-XR) 500 MG 24 hr tablet   Other Relevant Orders   Bayer DCA Hb A1c Waived   Microalbumin, Urine Waived   Comprehensive metabolic panel     Genitourinary   CKD (chronic kidney disease), stage III (HCC)   Chronic, ongoing CKD 3b.  Continue to collaborate with nephrology as needed and renal dose medication, Lisinopril for kidney protection.  CMP today.  Urine Alb 80 (January 2025).        Hematopoietic and Hemostatic   Thrombocytopenia (HCC)   Chronic, stable.  Recheck CBC every 6 months (ordered today), no current symptoms.  Previously followed by Dr. Kizzie Bane.  CBC today - for Q6MOS check = January and June.      Relevant Orders   CBC with Differential/Platelet     Follow up plan: Return in about 3 months (around 09/08/2023) for T2DM, HTN/HLD.

## 2023-06-10 NOTE — Assessment & Plan Note (Signed)
Chronic, ongoing.  Continue with current regimen and adjust as needed based on labs.  Lipid panel today.

## 2023-06-10 NOTE — Telephone Encounter (Signed)
Pt is calling to report to [facility] to please fax lab work and results  Alluri, Geradine Girt, MD  [address]  [place], [place] [address]  [phone] (Work)  [phone] (Fax)

## 2023-06-10 NOTE — Assessment & Plan Note (Signed)
Chronic, ongoing CKD 3b.  Continue to collaborate with nephrology as needed and renal dose medication, Lisinopril for kidney protection.  CMP today.  Urine Alb 80 (January 2025).

## 2023-06-10 NOTE — Assessment & Plan Note (Signed)
Chronic, follows with cardiology.  Continue current medication regimen as prescribed by cardiology at this time and adjust as needed.  Recent cardiology note reviewed.  Continue collaboration, appreciate their input.

## 2023-06-10 NOTE — Assessment & Plan Note (Signed)
Chronic, ongoing.  Continue current medication regimen and adjust as needed based on labs.  Thyroid labs today. 

## 2023-06-10 NOTE — Assessment & Plan Note (Signed)
Chronic, ongoing.  A1c 7% today, slight trend up over holidays. Urine Alb 80 (January 2025).   - Continue Ozempic 2 MG weekly + continue Metformin ER 500 MG BID -- discussed with patient may benefit in future change to [facility] or Farxiga and discontinuation Metformin due to kidney function in future.   Renal function limits medications that can be used + cost -- donut hole issues with Medicare.  He wishes to hold off on changes.  A1c is at goal per diabetes association and geriatric society based on age. - Recommend he check blood sugar twice a day, in morning fasting and 2 hours after a meal, document these.   - Collaborate with PharmD Elnita Maxwell. - Could consider a very low dose sulfonyurea if needed to keep cost low in future and avoid insulin.  At this time recommend heavy diet focus and regular exercise. - Return in 3 months.

## 2023-06-10 NOTE — Assessment & Plan Note (Signed)
Chronic, stable. BP below goal in office today and on home readings.  Recommend he monitor BP at least a few mornings a week at home and document.  DASH diet at home.  Continue current medication regimen and adjust as needed.  Labs today: CMP.   Refills sent in.

## 2023-06-10 NOTE — Assessment & Plan Note (Signed)
Refer to diabetes with CKD plan of care. °

## 2023-06-10 NOTE — Assessment & Plan Note (Signed)
Chronic, stable.  Recheck CBC every 6 months (ordered today), no current symptoms.  Previously followed by Dr. Kizzie Bane.  CBC today - for Q6MOS check = January and June.

## 2023-06-10 NOTE — Assessment & Plan Note (Signed)
Chronic, stable.  Noted on past imaging, may benefit return to vascular in future.  Continue preventative medications.  Last imaging with cardiology September 2022, stable.  Consider repeat this year.

## 2023-06-11 ENCOUNTER — Encounter: Payer: Self-pay | Admitting: Nurse Practitioner

## 2023-06-11 LAB — COMPREHENSIVE METABOLIC PANEL
ALT: 8 [IU]/L (ref 0–44)
AST: 11 [IU]/L (ref 0–40)
Albumin: 4.3 g/dL (ref 3.8–4.8)
Alkaline Phosphatase: 115 [IU]/L (ref 44–121)
BUN/Creatinine Ratio: 13 (ref 10–24)
BUN: 21 mg/dL (ref 8–27)
Bilirubin Total: 0.6 mg/dL (ref 0.0–1.2)
CO2: 22 mmol/L (ref 20–29)
Calcium: 9.5 mg/dL (ref 8.6–10.2)
Chloride: 102 mmol/L (ref 96–106)
Creatinine, Ser: 1.61 mg/dL — ABNORMAL HIGH (ref 0.76–1.27)
Globulin, Total: 2.6 g/dL (ref 1.5–4.5)
Glucose: 168 mg/dL — ABNORMAL HIGH (ref 70–99)
Potassium: 4.9 mmol/L (ref 3.5–5.2)
Sodium: 142 mmol/L (ref 134–144)
Total Protein: 6.9 g/dL (ref 6.0–8.5)
eGFR: 44 mL/min/{1.73_m2} — ABNORMAL LOW (ref >59)

## 2023-06-11 LAB — CBC WITH DIFFERENTIAL/PLATELET
Basophils Absolute: 0 10*3/uL (ref 0.0–0.2)
Basos: 1 %
EOS (ABSOLUTE): 0.1 10*3/uL (ref 0.0–0.4)
Eos: 2 %
Hematocrit: 43.2 % (ref 37.5–51.0)
Hemoglobin: 13.9 g/dL (ref 13.0–17.7)
Immature Grans (Abs): 0 10*3/uL (ref 0.0–0.1)
Immature Granulocytes: 0 %
Lymphocytes Absolute: 1.2 10*3/uL (ref 0.7–3.1)
Lymphs: 21 %
MCH: 28.3 pg (ref 26.6–33.0)
MCHC: 32.2 g/dL (ref 31.5–35.7)
MCV: 88 fL (ref 79–97)
Monocytes Absolute: 0.5 10*3/uL (ref 0.1–0.9)
Monocytes: 8 %
Neutrophils Absolute: 3.7 10*3/uL (ref 1.4–7.0)
Neutrophils: 68 %
Platelets: 198 10*3/uL (ref 150–450)
RBC: 4.91 x10E6/uL (ref 4.14–5.80)
RDW: 13.3 % (ref 11.6–15.4)
WBC: 5.5 10*3/uL (ref 3.4–10.8)

## 2023-06-11 LAB — LIPID PANEL W/O CHOL/HDL RATIO
Cholesterol, Total: 131 mg/dL (ref 100–199)
HDL: 47 mg/dL (ref >39)
LDL Chol Calc (NIH): 63 mg/dL (ref 0–99)
Triglycerides: 117 mg/dL (ref 0–149)
VLDL Cholesterol Cal: 21 mg/dL (ref 5–40)

## 2023-06-11 LAB — TSH: TSH: 1.82 u[IU]/mL (ref 0.450–4.500)

## 2023-06-11 LAB — T4, FREE: Free T4: 1.62 ng/dL (ref 0.82–1.77)

## 2023-06-11 NOTE — Progress Notes (Signed)
Contacted via MyChart   Good morning Trestin, your lab have returned and overall remain at baseline.  Kidney function, creatinine and eGFR, continues to show Stage 3a to b kidney disease with no significant worsening.  Remainder of labs good.  No medication changes needed.  Any questions? Keep being stellar!!  Thank you for allowing me to participate in your care.  I appreciate you. Kindest regards, Lynn Sissel

## 2023-06-13 NOTE — Telephone Encounter (Signed)
Lab results printed and faxed per patient request. Called and LVM notifying patient that this was done for him.

## 2023-06-14 ENCOUNTER — Encounter: Payer: Self-pay | Admitting: Certified Registered Nurse Anesthetist

## 2023-06-14 ENCOUNTER — Encounter
Admission: RE | Disposition: A | Discharge status: Home / Self Care | Payer: Self-pay | Source: Home / Self Care | Attending: Cardiology

## 2023-06-14 ENCOUNTER — Other Ambulatory Visit: Payer: Self-pay

## 2023-06-14 ENCOUNTER — Ambulatory Visit
Admission: RE | Admit: 2023-06-14 | Discharge: 2023-06-14 | Disposition: A | Discharge status: Home / Self Care | Payer: Medicare Other | Attending: Cardiology | Admitting: Cardiology

## 2023-06-14 ENCOUNTER — Ambulatory Visit
Admission: RE | Admit: 2023-06-14 | Discharge: 2023-06-14 | Disposition: A | Discharge status: Home / Self Care | Payer: Medicare Other | Source: Home / Self Care | Attending: Student | Admitting: Cardiology

## 2023-06-14 DIAGNOSIS — I480 Paroxysmal atrial fibrillation: Secondary | ICD-10-CM | POA: Diagnosis not present

## 2023-06-14 DIAGNOSIS — I4891 Unspecified atrial fibrillation: Secondary | ICD-10-CM | POA: Insufficient documentation

## 2023-06-14 DIAGNOSIS — Z9889 Other specified postprocedural states: Secondary | ICD-10-CM | POA: Diagnosis not present

## 2023-06-14 DIAGNOSIS — Z538 Procedure and treatment not carried out for other reasons: Secondary | ICD-10-CM | POA: Diagnosis not present

## 2023-06-14 DIAGNOSIS — I6523 Occlusion and stenosis of bilateral carotid arteries: Secondary | ICD-10-CM | POA: Diagnosis not present

## 2023-06-14 HISTORY — PX: CARDIOVERSION: SHX1299

## 2023-06-14 HISTORY — PX: TEE WITHOUT CARDIOVERSION: SHX5443

## 2023-06-14 LAB — GLUCOSE, CAPILLARY: Glucose-Capillary: 162 mg/dL — ABNORMAL HIGH (ref 70–99)

## 2023-06-14 SURGERY — ECHOCARDIOGRAM, TRANSESOPHAGEAL
Anesthesia: General

## 2023-06-14 MED ORDER — BUTAMBEN-TETRACAINE-BENZOCAINE 2-2-14 % EX AERO
INHALATION_SPRAY | CUTANEOUS | Status: AC
  Filled 2023-06-14: qty 5

## 2023-06-14 MED ORDER — LIDOCAINE VISCOUS HCL 2 % MT SOLN
OROMUCOSAL | Status: AC
  Filled 2023-06-14: qty 15

## 2023-06-14 MED ORDER — SODIUM CHLORIDE 0.9 % IV SOLN: INTRAVENOUS | Status: DC

## 2023-06-15 ENCOUNTER — Encounter: Payer: Self-pay | Admitting: Cardiology

## 2023-06-21 MED ORDER — SODIUM CHLORIDE 0.9 % IV SOLN: INTRAVENOUS | Status: DC

## 2023-06-22 ENCOUNTER — Encounter
Admission: RE | Disposition: A | Discharge status: Home / Self Care | Payer: Self-pay | Source: Home / Self Care | Attending: Internal Medicine

## 2023-06-22 ENCOUNTER — Encounter: Payer: Self-pay | Admitting: Internal Medicine

## 2023-06-22 ENCOUNTER — Other Ambulatory Visit: Payer: Self-pay

## 2023-06-22 ENCOUNTER — Encounter: Payer: Self-pay | Admitting: Nurse Practitioner

## 2023-06-22 ENCOUNTER — Ambulatory Visit
Admission: RE | Admit: 2023-06-22 | Discharge: 2023-06-22 | Disposition: A | Discharge status: Home / Self Care | Payer: Medicare Other | Attending: Internal Medicine | Admitting: Internal Medicine

## 2023-06-22 ENCOUNTER — Ambulatory Visit: Payer: Medicare Other

## 2023-06-22 ENCOUNTER — Ambulatory Visit
Admission: RE | Admit: 2023-06-22 | Discharge: 2023-06-22 | Disposition: A | Discharge status: Home / Self Care | Payer: Medicare Other | Source: Home / Self Care | Attending: Student | Admitting: Internal Medicine

## 2023-06-22 DIAGNOSIS — I491 Atrial premature depolarization: Secondary | ICD-10-CM | POA: Diagnosis not present

## 2023-06-22 DIAGNOSIS — I130 Hypertensive heart and chronic kidney disease with heart failure and stage 1 through stage 4 chronic kidney disease, or unspecified chronic kidney disease: Secondary | ICD-10-CM | POA: Diagnosis not present

## 2023-06-22 DIAGNOSIS — I480 Paroxysmal atrial fibrillation: Secondary | ICD-10-CM

## 2023-06-22 DIAGNOSIS — E1122 Type 2 diabetes mellitus with diabetic chronic kidney disease: Secondary | ICD-10-CM | POA: Diagnosis not present

## 2023-06-22 DIAGNOSIS — I4892 Unspecified atrial flutter: Secondary | ICD-10-CM | POA: Insufficient documentation

## 2023-06-22 DIAGNOSIS — N183 Chronic kidney disease, stage 3 unspecified: Secondary | ICD-10-CM | POA: Diagnosis not present

## 2023-06-22 DIAGNOSIS — Z794 Long term (current) use of insulin: Secondary | ICD-10-CM | POA: Diagnosis not present

## 2023-06-22 DIAGNOSIS — Z7984 Long term (current) use of oral hypoglycemic drugs: Secondary | ICD-10-CM | POA: Diagnosis not present

## 2023-06-22 DIAGNOSIS — I4891 Unspecified atrial fibrillation: Secondary | ICD-10-CM | POA: Diagnosis not present

## 2023-06-22 DIAGNOSIS — Z87891 Personal history of nicotine dependence: Secondary | ICD-10-CM | POA: Diagnosis not present

## 2023-06-22 HISTORY — PX: CARDIOVERSION: SHX1299

## 2023-06-22 HISTORY — PX: TEE WITHOUT CARDIOVERSION: SHX5443

## 2023-06-22 LAB — GLUCOSE, CAPILLARY: Glucose-Capillary: 140 mg/dL — ABNORMAL HIGH (ref 70–99)

## 2023-06-22 SURGERY — ECHOCARDIOGRAM, TRANSESOPHAGEAL
Anesthesia: General

## 2023-06-22 MED ORDER — OXYCODONE HCL 5 MG/5ML PO SOLN: 5.0000 mg | Freq: Once | ORAL | Status: DC | PRN

## 2023-06-22 MED ORDER — FENTANYL CITRATE (PF) 100 MCG/2ML IJ SOLN: 25.0000 ug | INTRAMUSCULAR | Status: DC | PRN

## 2023-06-22 MED ORDER — PROPOFOL 500 MG/50ML IV EMUL
INTRAVENOUS | Status: DC | PRN
  Administered 2023-06-22: 50 mg via INTRAVENOUS

## 2023-06-22 MED ORDER — OXYCODONE HCL 5 MG PO TABS: 5.0000 mg | ORAL_TABLET | Freq: Once | ORAL | Status: DC | PRN

## 2023-06-22 MED ORDER — ACETAMINOPHEN 10 MG/ML IV SOLN: 1000.0000 mg | Freq: Once | INTRAVENOUS | Status: DC | PRN

## 2023-06-22 MED ORDER — DROPERIDOL 2.5 MG/ML IJ SOLN: 0.6250 mg | Freq: Once | INTRAMUSCULAR | Status: DC | PRN

## 2023-06-22 MED ORDER — LIDOCAINE VISCOUS HCL 2 % MT SOLN
OROMUCOSAL | Status: AC
  Filled 2023-06-22: qty 15

## 2023-06-22 MED ORDER — BUTAMBEN-TETRACAINE-BENZOCAINE 2-2-14 % EX AERO
INHALATION_SPRAY | CUTANEOUS | Status: AC
  Filled 2023-06-22: qty 5

## 2023-06-22 NOTE — CV Procedure (Signed)
Direct current cardioversion 06/22/2023 1:30 PM  Indication symptomatic A. Fibrillation.  Procedure: Using IV Propofol and IV Lidocaine (for reducing venous pain) for achieving deep sedation, synchronized direct current cardioversion performed. Patient was delivered with 150 Joules of electricity X 1 with success to NSR. Patient tolerated the procedure well. No immediate complication noted.   Peng Thorstenson, DO 1:31 PM 06/22/23

## 2023-06-22 NOTE — Anesthesia Preprocedure Evaluation (Signed)
Anesthesia Evaluation  Patient identified by MRN, date of birth, ID band Patient awake    Reviewed: Allergy & Precautions, H&P , NPO status , Patient's Chart, lab work & pertinent test results, reviewed documented beta blocker date and time   Airway Mallampati: II   Neck ROM: full    Dental  (+) Poor Dentition   Pulmonary neg pulmonary ROS, former smoker   Pulmonary exam normal        Cardiovascular Exercise Tolerance: Poor hypertension, On Medications + Peripheral Vascular Disease  Normal cardiovascular exam Rhythm:regular Rate:Normal     Neuro/Psych negative neurological ROS  negative psych ROS   GI/Hepatic negative GI ROS, Neg liver ROS,,,  Endo/Other  diabetesHypothyroidism    Renal/GU Renal disease  negative genitourinary   Musculoskeletal   Abdominal   Peds  Hematology negative hematology ROS (+)   Anesthesia Other Findings Past Medical History: No date: Atrial flutter (HCC) No date: Diabetes mellitus without complication (HCC) No date: Hyperlipidemia No date: Hypertension No date: Lymphopenia No date: Thrombocytopenia [facility]) Past Surgical History: 11/22/2019: CARDIOVERSION; N/A     Comment:  Procedure: CARDIOVERSION;  Surgeon: Lamar Blinks,               MD;  Location: [facility] ORS;  Service: Cardiovascular;                Laterality: N/A; 06/14/2023: CARDIOVERSION; N/A     Comment:  Procedure: CARDIOVERSION;  Surgeon: Kathryne Gin,               MD;  Location: [facility] ORS;  Service: Cardiovascular;                Laterality: N/A; No date: heart ablation No date: KNEE SURGERY; Bilateral 06/14/2023: TEE WITHOUT CARDIOVERSION; N/A     Comment:  Procedure: TRANSESOPHAGEAL ECHOCARDIOGRAM (TEE);                Surgeon: Alluri, Meryl Dare, MD;  Location: [facility] ORS;                Service: Cardiovascular;  Laterality: N/A;   Reproductive/Obstetrics negative OB ROS                              Anesthesia Physical Anesthesia Plan  ASA: 4  Anesthesia Plan: General   Post-op Pain Management:    Induction:   PONV Risk Score and Plan:   Airway Management Planned:   Additional Equipment:   Intra-op Plan:   Post-operative Plan:   Informed Consent: I have reviewed the patients History and Physical, chart, labs and discussed the procedure including the risks, benefits and alternatives for the proposed anesthesia with the patient or authorized representative who has indicated his/her understanding and acceptance.     Dental Advisory Given  Plan Discussed with: CRNA  Anesthesia Plan Comments:        Anesthesia Quick Evaluation

## 2023-06-22 NOTE — Transfer of Care (Signed)
Immediate Anesthesia Transfer of Care Note  Patient: Jonathan [name]  Procedure(s) Performed: TRANSESOPHAGEAL ECHOCARDIOGRAM (TEE) CARDIOVERSION  Patient Location: [facility]ATH LAB  Anesthesia Type:General  Level of Consciousness: alert  and patient cooperative  Airway & Oxygen Therapy: Patient Spontanous Breathing and Patient connected to nasal cannula oxygen  Post-op Assessment: Report given to RN  Post vital signs: Reviewed and stable  Last Vitals:  Vitals Value Taken Time  BP    Temp    Pulse    Resp    SpO2      Last Pain:  Vitals:   06/22/23 1203  PainSc: 0-No pain         Complications: There were no known notable events for this encounter.

## 2023-06-23 ENCOUNTER — Encounter: Payer: Self-pay | Admitting: Internal Medicine

## 2023-06-28 NOTE — Anesthesia Postprocedure Evaluation (Signed)
 Anesthesia Post Note  Patient: Jonathan [name]  Procedure(s) Performed: TRANSESOPHAGEAL ECHOCARDIOGRAM (TEE) CARDIOVERSION  Patient location during evaluation: PACU Anesthesia Type: General Level of consciousness: awake and alert Pain management: pain level controlled Vital Signs Assessment: post-procedure vital signs reviewed and stable Respiratory status: spontaneous breathing, nonlabored ventilation, respiratory function stable and patient connected to nasal cannula oxygen Cardiovascular status: blood pressure returned to baseline and stable Postop Assessment: no apparent nausea or vomiting Anesthetic complications: no   There were no known notable events for this encounter.   Last Vitals:  Vitals:   06/22/23 1248 06/22/23 1258  BP: 109/69 110/67  Pulse: 74 75  Resp: 16 20  Temp:    SpO2: 95% 97%    Last Pain:  Vitals:   06/22/23 1234  PainSc: 0-No pain                 Lynwood KANDICE Clause

## 2023-07-18 DIAGNOSIS — I480 Paroxysmal atrial fibrillation: Secondary | ICD-10-CM | POA: Diagnosis not present

## 2023-07-21 DIAGNOSIS — I4891 Unspecified atrial fibrillation: Secondary | ICD-10-CM | POA: Diagnosis not present

## 2023-07-21 DIAGNOSIS — I340 Nonrheumatic mitral (valve) insufficiency: Secondary | ICD-10-CM | POA: Diagnosis not present

## 2023-07-21 DIAGNOSIS — I10 Essential (primary) hypertension: Secondary | ICD-10-CM | POA: Diagnosis not present

## 2023-07-21 DIAGNOSIS — E782 Mixed hyperlipidemia: Secondary | ICD-10-CM | POA: Diagnosis not present

## 2023-07-21 DIAGNOSIS — I480 Paroxysmal atrial fibrillation: Secondary | ICD-10-CM | POA: Diagnosis not present

## 2023-08-15 ENCOUNTER — Ambulatory Visit (INDEPENDENT_AMBULATORY_CARE_PROVIDER_SITE_OTHER): Payer: Medicare Other | Admitting: Dermatology

## 2023-08-15 DIAGNOSIS — D485 Neoplasm of uncertain behavior of skin: Secondary | ICD-10-CM

## 2023-08-15 DIAGNOSIS — L578 Other skin changes due to chronic exposure to nonionizing radiation: Secondary | ICD-10-CM

## 2023-08-15 DIAGNOSIS — D229 Melanocytic nevi, unspecified: Secondary | ICD-10-CM

## 2023-08-15 DIAGNOSIS — L814 Other melanin hyperpigmentation: Secondary | ICD-10-CM

## 2023-08-15 DIAGNOSIS — D492 Neoplasm of unspecified behavior of bone, soft tissue, and skin: Secondary | ICD-10-CM

## 2023-08-15 DIAGNOSIS — D489 Neoplasm of uncertain behavior, unspecified: Secondary | ICD-10-CM

## 2023-08-15 DIAGNOSIS — W908XXA Exposure to other nonionizing radiation, initial encounter: Secondary | ICD-10-CM

## 2023-08-15 DIAGNOSIS — L821 Other seborrheic keratosis: Secondary | ICD-10-CM

## 2023-08-15 DIAGNOSIS — D225 Melanocytic nevi of trunk: Secondary | ICD-10-CM

## 2023-08-15 DIAGNOSIS — D235 Other benign neoplasm of skin of trunk: Secondary | ICD-10-CM

## 2023-08-15 DIAGNOSIS — L738 Other specified follicular disorders: Secondary | ICD-10-CM | POA: Diagnosis not present

## 2023-08-15 DIAGNOSIS — D239 Other benign neoplasm of skin, unspecified: Secondary | ICD-10-CM

## 2023-08-15 DIAGNOSIS — C439 Malignant melanoma of skin, unspecified: Secondary | ICD-10-CM

## 2023-08-15 HISTORY — PX: MELANOMA EXCISION: SHX5266

## 2023-08-15 HISTORY — DX: Other benign neoplasm of skin, unspecified: D23.9

## 2023-08-15 HISTORY — DX: Malignant melanoma of skin, unspecified: C43.9

## 2023-08-15 NOTE — Progress Notes (Signed)
 New Patient Visit   Subjective  Jonathan [name] is a 78 y.o. male who presents for the following: concerned about a 2 spots that left posterior shoulder he would like checked. They have been there for years without change. Denies family history of skin cancer.  No personal history of skin cancer.   wife is with patient and contributes to history.  The patient has spots, moles and lesions to be evaluated, some may be new or changing and the patient may have concern these could be cancer.   The following portions of the chart were reviewed this encounter and updated as appropriate: medications, allergies, medical history  Review of Systems:  No other skin or systemic complaints except as noted in HPI or Assessment and Plan.  Objective  Well appearing patient in no apparent distress; mood and affect are within normal limits.   A focused examination was performed of the following areas: Left upper back, left posterior shoulder , back, chest  Relevant exam findings are noted in the Assessment and Plan.  Left Upper Back 1.3 x 0.7 cm waxy pink/tan papule with dark brown medial portion   left shoulder 6 mm speckled tan macule darker center  right chest 8 x 6 mm pink tan papule    Assessment & Plan   LENTIGINES Exam: scattered tan macules Due to sun exposure Treatment Plan: Benign-appearing, observe. Recommend daily broad spectrum sunscreen SPF 30+ to sun-exposed areas, reapply every 2 hours as needed.  Call for any changes  SEBORRHEIC KERATOSIS - Stuck-on, waxy, tan-brown papules and/or plaques  - Benign-appearing - Discussed benign etiology and prognosis. - Observe - Call for any changes  Dilated Pore of Winer at mid back Exam: dilated pore at mid back  Treatment Plan: Benign-appearing.  Observation.  Call clinic for new or changing lesions.  Recommend daily use of broad spectrum spf 30+ sunscreen to sun-exposed areas.    MELANOCYTIC NEVI Exam: Tan-brown and/or  pink-flesh-colored symmetric macules and papules trunk Nevus  - 4 mm brown macule at right spinal lower back  Treatment Plan: Benign appearing on exam today. Recommend observation. Call clinic for new or changing moles. Recommend daily use of broad spectrum spf 30+ sunscreen to sun-exposed areas.    ACTINIC DAMAGE - chronic, secondary to cumulative UV radiation exposure/sun exposure over time - diffuse scaly erythematous macules with underlying dyspigmentation - Recommend daily broad spectrum sunscreen SPF 30+ to sun-exposed areas, reapply every 2 hours as needed.  - Recommend staying in the shade or wearing long sleeves, sun glasses (UVA+UVB protection) and wide brim hats (4-inch brim around the entire circumference of the hat). - Call for new or changing lesions.  NEOPLASM OF UNCERTAIN BEHAVIOR (3) Left Upper Back Skin / nail biopsy Type of biopsy: tangential   Informed consent: discussed and consent obtained   Patient was prepped and draped in usual sterile fashion: Area prepped with alcohol. Anesthesia: the lesion was anesthetized in a standard fashion   Anesthetic:  1% lidocaine w/ epinephrine 1-100,000 buffered w/ 8.4% NaHCO3 Instrument used: flexible razor blade   Hemostasis achieved with: pressure, aluminum chloride and electrodesiccation   Outcome: patient tolerated procedure well   Post-procedure details: wound care instructions given   Post-procedure details comment:  Ointment and small bandage applied  Anatomic Pathology Report left shoulder Skin / nail biopsy Type of biopsy: tangential   Informed consent: discussed and consent obtained   Patient was prepped and draped in usual sterile fashion: Area prepped with alcohol. Anesthesia: the  lesion was anesthetized in a standard fashion   Anesthetic:  1% lidocaine w/ epinephrine 1-100,000 buffered w/ 8.4% NaHCO3 Instrument used: flexible razor blade   Hemostasis achieved with: pressure, aluminum chloride and  electrodesiccation   Outcome: patient tolerated procedure well   Post-procedure details: wound care instructions given   Post-procedure details comment:  Ointment and small bandage applied  Anatomic Pathology Report right chest Skin / nail biopsy Type of biopsy: tangential   Informed consent: discussed and consent obtained   Patient was prepped and draped in usual sterile fashion: Area prepped with alcohol. Anesthesia: the lesion was anesthetized in a standard fashion   Anesthetic:  1% lidocaine w/ epinephrine 1-100,000 buffered w/ 8.4% NaHCO3 Instrument used: flexible razor blade   Hemostasis achieved with: pressure, aluminum chloride and electrodesiccation   Outcome: patient tolerated procedure well   Post-procedure details: wound care instructions given   Post-procedure details comment:  Ointment and small bandage applied  Anatomic Pathology Report Isk r/o atypia left upper back  Lentigo r/o atypia left shoulder  Nevus r/o dysplasia at right chest   Specimens requested to be sent  to [facility]   Return if symptoms worsen or fail to improve. Pending biopsy results  I, Asher Muir, CMA, am acting as scribe for Willeen Niece, MD.   Documentation: I have reviewed the above documentation for accuracy and completeness, and I agree with the above.  Willeen Niece, MD

## 2023-08-15 NOTE — Patient Instructions (Addendum)
Biopsy Wound Care Instructions  Leave the original bandage on for 24 hours if possible.  If the bandage becomes soaked or soiled before that time, it is OK to remove it and examine the wound.  A small amount of post-operative bleeding is normal.  If excessive bleeding occurs, remove the bandage, place gauze over the site and apply continuous pressure (no peeking) over the area for 30 minutes. If this does not work, please call our clinic as soon as possible or page your doctor if it is after hours.   Once a day, cleanse the wound with soap and water. It is fine to shower. If a thick crust develops you may use a Q-tip dipped into dilute hydrogen peroxide (mix 1:1 with water) to dissolve it.  Hydrogen peroxide can slow the healing process, so use it only as needed.    After washing, apply petroleum jelly (Vaseline) or an antibiotic ointment if your doctor prescribed one for you, followed by a bandage.    For best healing, the wound should be covered with a layer of ointment at all times. If you are not able to keep the area covered with a bandage to hold the ointment in place, this may mean re-applying the ointment several times a day.  Continue this wound care until the wound has healed and is no longer open.   Itching and mild discomfort is normal during the healing process. However, if you develop pain or severe itching, please call our office.   If you have any discomfort, you can take Tylenol (acetaminophen) or ibuprofen as directed on the bottle. (Please do not take these if you have an allergy to them or cannot take them for another reason).  Some redness, tenderness and white or yellow material in the wound is normal healing.  If the area becomes very sore and red, or develops a thick yellow-green material (pus), it may be infected; please notify us.    If you have stitches, return to clinic as directed to have the stitches removed. You will continue wound care for 2-3 days after the stitches  are removed.   Wound healing continues for up to one year following surgery. It is not unusual to experience pain in the scar from time to time during the interval.  If the pain becomes severe or the scar thickens, you should notify the office.    A slight amount of redness in a scar is expected for the first six months.  After six months, the redness will fade and the scar will soften and fade.  The color difference becomes less noticeable with time.  If there are any problems, return for a post-op surgery check at your earliest convenience.  To improve the appearance of the scar, you can use silicone scar gel, cream, or sheets (such as Mederma or Serica) every night for up to one year. These are available over the counter (without a prescription).  Please call our office at [phone] for any questions or concerns.      Due to recent changes in healthcare laws, you may see results of your pathology and/or laboratory studies on MyChart before the doctors have had a chance to review them. We understand that in some cases there may be results that are confusing or concerning to you. Please understand that not all results are received at the same time and often the doctors may need to interpret multiple results in order to provide you with the best plan of care or  course of treatment. Therefore, we ask that you please give [place] 2 business days to thoroughly review all your results before contacting the office for clarification. Should we see a critical lab result, you will be contacted sooner.   If You Need Anything After Your Visit  If you have any questions or concerns for your doctor, please call our main line at [phone] and press option 4 to reach your doctor's medical assistant. If no one answers, please leave a voicemail as directed and we will return your call as soon as possible. Messages left after 4 pm will be answered the following business day.   You may also send [place] a message  via MyChart. We typically respond to MyChart messages within 1-2 business days.  For prescription refills, please ask your pharmacy to contact our office. Our fax number is [phone].  If you have an urgent issue when the clinic is closed that cannot wait until the next business day, you can page your doctor at the number below.    Please note that while we do our best to be available for urgent issues outside of office hours, we are not available 24/7.   If you have an urgent issue and are unable to reach [place], you may choose to seek medical care at your doctor's office, retail clinic, urgent care center, or emergency room.  If you have a medical emergency, please immediately call 911 or go to the emergency department.  Pager Numbers  - Dr. Gwen Pounds: [phone]  - Dr. Roseanne Reno: [phone]  - Dr. Katrinka Blazing: [phone]   In the event of inclement weather, please call our main line at [phone] for an update on the status of any delays or closures.  Dermatology Medication Tips: Please keep the boxes that topical medications come in in order to help keep track of the instructions about where and how to use these. Pharmacies typically print the medication instructions only on the boxes and not directly on the medication tubes.   If your medication is too expensive, please contact our office at [phone] option 4 or send [place] a message through MyChart.   We are unable to tell what your co-pay for medications will be in advance as this is different depending on your insurance coverage. However, we may be able to find a substitute medication at lower cost or fill out paperwork to get insurance to cover a needed medication.   If a prior authorization is required to get your medication covered by your insurance company, please allow [place] 1-2 business days to complete this process.  Drug prices often vary depending on where the prescription is filled and some pharmacies may offer cheaper  prices.  The website www.goodrx.com contains coupons for medications through different pharmacies. The prices here do not account for what the cost may be with help from insurance (it may be cheaper with your insurance), but the website can give you the price if you did not use any insurance.  - You can print the associated coupon and take it with your prescription to the pharmacy.  - You may also stop by our office during regular business hours and pick up a GoodRx coupon card.  - If you need your prescription sent electronically to a different pharmacy, notify our office through [facility] or by phone at [phone] option 4.     Si Usted Necesita Algo Despus de Su Visita  Tambin puede enviarnos un mensaje a travs de Clinical cytogeneticist. Por lo general respondemos  a los mensajes de MyChart en el transcurso de 1 a 2 das hbiles.  Para renovar recetas, por favor pida a su farmacia que se ponga en contacto con nuestra oficina. Annie Sable de fax es [place] [phone].  Si tiene un asunto urgente cuando la clnica est cerrada y que no puede esperar hasta el siguiente da hbil, puede llamar/localizar a su doctor(a) al nmero que aparece a continuacin.   Por favor, tenga en cuenta que aunque hacemos todo lo posible para estar disponibles para asuntos urgentes fuera del horario de [place], no estamos disponibles las 24 horas del da, los 7 [address]  [address] de la [place].   Si tiene un problema urgente y no puede comunicarse con nosotros, puede optar por buscar atencin mdica  en el consultorio de su doctor(a), en una clnica privada, en un centro de atencin urgente o en una sala de emergencias.  Si tiene Engineer, drilling, por favor llame inmediatamente al 911 o vaya a la sala de emergencias.  Nmeros de bper  - Dr. Gwen Pounds: [phone]  - Dra. Roseanne Reno: [phone]  - Dr. Katrinka Blazing: [phone]   En caso de inclemencias del tiempo, por favor llame a Lacy Duverney principal al [phone]  para una actualizacin sobre el [place] de cualquier retraso o cierre.  Consejos para la medicacin en dermatologa: Por favor, guarde las cajas en las que vienen los medicamentos de uso tpico para ayudarle a seguir las instrucciones sobre dnde y cmo usarlos. Las farmacias generalmente imprimen las instrucciones del medicamento slo en las cajas y no directamente en los tubos del [place].   Si su medicamento es muy caro, por favor, pngase en contacto con Rolm [place]ala llamando al [phone] y presione la opcin 4 o envenos un mensaje a travs de Clinical cytogeneticist.   No podemos decirle cul ser su copago por los medicamentos por adelantado ya que esto es diferente dependiendo de la cobertura de su seguro. Sin embargo, es posible que podamos encontrar un medicamento sustituto a Audiological scientist un formulario para que el seguro cubra el medicamento que se considera necesario.   Si se requiere una autorizacin previa para que su compaa de seguros [place] su medicamento, por favor permtanos de 1 a 2 das hbiles para completar [address]  Los precios de los medicamentos varan con frecuencia dependiendo del Environmental consultant de dnde se surte la receta y alguna farmacias pueden ofrecer precios ms baratos.  El sitio web www.goodrx.com tiene cupones para medicamentos de Health and safety inspector. Los precios aqu no tienen en cuenta lo que podra costar con la ayuda del seguro (puede ser ms barato con su seguro), pero el sitio web puede darle el precio si no utiliz Tourist information centre manager.  - Puede imprimir el cupn correspondiente y llevarlo con su receta a la farmacia.  - Tambin puede pasar por nuestra oficina durante el horario de atencin regular y Education officer, museum una tarjeta de cupones de GoodRx.  - Si necesita que su receta se enve electrnicamente a una farmacia diferente, informe a nuestra oficina a travs de MyChart de [place] o por telfono llamando al [phone] y presione la opcin 4.

## 2023-08-18 LAB — ANATOMIC PATHOLOGY REPORT

## 2023-08-22 ENCOUNTER — Telehealth: Payer: Self-pay

## 2023-08-22 NOTE — Telephone Encounter (Signed)
 Called [facility] and talked to [place]. She will contact [facility] to have 3 slides from biopsies done 08/15/23 re-read by a dermatopathologist (Dr Katrinka Blazing).

## 2023-08-22 NOTE — Telephone Encounter (Signed)
-----   Message from Willeen Niece sent at 08/22/2023 10:46 AM EDT ----- Part A-right chest ,Skin Biopsy: INFLAMED COMPOUND NEVUS  WITH MILD ATYPIA, MARGINS INVOLVED, SEE COMMENT  Part B-left upper back ,Skin Biopsy: INFALMED JUNCTIONAL  DYSPLASTIC NEVUS WITH MILD TO MODERAT ATYPIA, SEE COMMENT  Part C-left shoulder ,Skin Biopsy: INFLAMED JUNCTIONAL  DYSPLASTIC NEVUS WITH MILD ATYPIA, SEE COMMENT    All three are atypical moles with regression noted in comments and excision recommended.  Clinically they look more worrisome than what the report says.  I have discussed this with Mr. Zapien and have recommended that we send the slides to [place] to be re-read before proceeding with treatment. - please have [place] request the slides from [facility].  Pt has been notified and is aware there may be an extra charge.

## 2023-08-24 DIAGNOSIS — C4359 Malignant melanoma of other part of trunk: Secondary | ICD-10-CM | POA: Diagnosis not present

## 2023-08-24 DIAGNOSIS — D2262 Melanocytic nevi of left upper limb, including shoulder: Secondary | ICD-10-CM | POA: Diagnosis not present

## 2023-08-30 ENCOUNTER — Telehealth: Payer: Self-pay

## 2023-08-30 NOTE — Telephone Encounter (Signed)
 Judeth Cornfield from [facility] called asking if we could send blocks 1 & 2 to them for consult. I advised her that we do not have that here so she will contact [facility] where biopsies were sent. Butch Penny., RMA

## 2023-09-08 ENCOUNTER — Telehealth: Payer: Self-pay

## 2023-09-08 NOTE — Telephone Encounter (Signed)
 Jonathan [name] called to report consult pathology report. R chest MM Clark's level 4, tumor thickness 1.1 mm, deep and peripheral margin involved and deep margin transected. L upper back Clark's level 3, tumor thickness 1.0 mm peripheral margin narrowly free, L shoulder moderately dysplastic nevus. Awaiting fax report from [facility].

## 2023-09-11 NOTE — Patient Instructions (Signed)
Be Involved in Caring For Your Health:  Taking Medications When medications are taken as directed, they can greatly improve your health. But if they are not taken as prescribed, they may not work. In some cases, not taking them correctly can be harmful. To help ensure your treatment remains effective and safe, understand your medications and how to take them. Bring your medications to each visit for review by your provider.  Your lab results, notes, and after visit summary will be available on My Chart. We strongly encourage you to use this feature. If lab results are abnormal the clinic will contact you with the appropriate steps. If the clinic does not contact you assume the results are satisfactory. You can always view your results on My Chart. If you have questions regarding your health or results, please contact the clinic during office hours. You can also ask questions on My Chart.  We at [facility] are grateful that you chose [place] to provide your care. We strive to provide evidence-based and compassionate care and are always looking for feedback. If you get a survey from the clinic please complete this so we can hear your opinions.  Diabetes Mellitus and Exercise Regular exercise is important for your health, especially if you have diabetes mellitus. Exercise is not just about losing weight. It can also help you increase muscle strength and bone density and reduce body fat and stress. This can help your level of endurance and make you more fit and flexible. Why should I exercise if I have diabetes? Exercise has many benefits for people with diabetes. It can: Help lower and control your blood sugar (glucose). Help your body respond better and become more sensitive to the hormone insulin. Reduce how much insulin your body needs. Lower your risk for heart disease by: Lowering how much "bad" cholesterol and triglycerides you have in your body. Increasing how much "good" cholesterol  you have in your body. Lowering your blood pressure. Lowering your blood glucose levels. What is my activity plan? Your health care provider or an expert trained in diabetes care (certified diabetes educator) can help you make an activity plan. This plan can help you find the type of exercise that works for you. It may also tell you how often to exercise and for how long. Be sure to: Get at least 150 minutes of medium-intensity or high-intensity exercise each week. This may involve brisk walking, biking, or water aerobics. Do stretching and strengthening exercises at least 2 times a week. This may involve yoga or weight lifting. Spread out your activity over at least 3 days of the week. Get some form of physical activity each day. Do not go more than 2 days in a row without some kind of activity. Avoid being inactive for more than 30 minutes at a time. Take frequent breaks to walk or stretch. Choose activities that you enjoy. Set goals that you know you can accomplish. Start slowly and increase the intensity of your exercise over time. How do I manage my diabetes during exercise?  Monitor your blood glucose Check your blood glucose before and after you exercise. If your blood glucose is 240 mg/dL (40.9 mmol/L) or higher before you exercise, check your urine for ketones. These are chemicals created by the liver. If you have ketones in your urine, do not exercise until your blood glucose returns to normal. If your blood glucose is 100 mg/dL (5.6 mmol/L) or lower, eat a snack that has 15-20 grams of carbohydrate in  it. Check your blood glucose 15 minutes after the snack to make sure that your level is above 100 mg/dL (5.6 mmol/L) before you start to exercise. Your risk for low blood glucose (hypoglycemia) goes up during and after exercise. Know the symptoms of this condition and how to treat it. Follow these instructions at home: Keep a carbohydrate snack on hand for use before, during, and after  exercise. This can help prevent or treat hypoglycemia. Avoid injecting insulin into parts of your body that are going to be used during exercise. This may include: Your arms, when you are going to play tennis. Your legs, when you are about to go jogging. Keep track of your exercise habits. This can help you and your health care provider watch and adjust your activity plan. Write down: What you eat before and after you exercise. Blood glucose levels before and after you exercise. The type and amount of exercise you do. Talk to your health care provider before you start a new activity. They may need to: Make sure that the activity is safe for you. Adjust your insulin, other medicines, and food that you eat. Drink water while you exercise. This can stop you from losing too much water (dehydration). It can also prevent problems caused by having a lot of heat in your body (heat stroke). Where to find more information American Diabetes Association: diabetes.org Association of Diabetes Care & Education Specialists: diabeteseducator.org This information is not intended to replace advice given to you by your health care provider. Make sure you discuss any questions you have with your health care provider. Document Revised: 10/28/2021 Document Reviewed: 10/28/2021 Elsevier Patient Education  2024 [employer].

## 2023-09-12 ENCOUNTER — Other Ambulatory Visit: Payer: Self-pay

## 2023-09-12 DIAGNOSIS — C439 Malignant melanoma of skin, unspecified: Secondary | ICD-10-CM

## 2023-09-12 NOTE — Progress Notes (Signed)
Referral to Dr. Freddi Che

## 2023-09-14 ENCOUNTER — Encounter: Payer: Self-pay | Admitting: Nurse Practitioner

## 2023-09-14 ENCOUNTER — Ambulatory Visit (INDEPENDENT_AMBULATORY_CARE_PROVIDER_SITE_OTHER): Payer: Self-pay | Admitting: Nurse Practitioner

## 2023-09-14 VITALS — BP 123/67 | HR 75 | Temp 97.5°F | Ht 69.0 in | Wt 193.2 lb

## 2023-09-14 DIAGNOSIS — E785 Hyperlipidemia, unspecified: Secondary | ICD-10-CM | POA: Diagnosis not present

## 2023-09-14 DIAGNOSIS — E1122 Type 2 diabetes mellitus with diabetic chronic kidney disease: Secondary | ICD-10-CM | POA: Diagnosis not present

## 2023-09-14 DIAGNOSIS — N1832 Chronic kidney disease, stage 3b: Secondary | ICD-10-CM | POA: Diagnosis not present

## 2023-09-14 DIAGNOSIS — I152 Hypertension secondary to endocrine disorders: Secondary | ICD-10-CM | POA: Diagnosis not present

## 2023-09-14 DIAGNOSIS — Z7985 Long-term (current) use of injectable non-insulin antidiabetic drugs: Secondary | ICD-10-CM

## 2023-09-14 DIAGNOSIS — D696 Thrombocytopenia, unspecified: Secondary | ICD-10-CM | POA: Diagnosis not present

## 2023-09-14 DIAGNOSIS — C439 Malignant melanoma of skin, unspecified: Secondary | ICD-10-CM

## 2023-09-14 DIAGNOSIS — E1159 Type 2 diabetes mellitus with other circulatory complications: Secondary | ICD-10-CM | POA: Diagnosis not present

## 2023-09-14 DIAGNOSIS — E1169 Type 2 diabetes mellitus with other specified complication: Secondary | ICD-10-CM

## 2023-09-14 DIAGNOSIS — I480 Paroxysmal atrial fibrillation: Secondary | ICD-10-CM

## 2023-09-14 DIAGNOSIS — D692 Other nonthrombocytopenic purpura: Secondary | ICD-10-CM

## 2023-09-14 DIAGNOSIS — E119 Type 2 diabetes mellitus without complications: Secondary | ICD-10-CM

## 2023-09-14 DIAGNOSIS — E039 Hypothyroidism, unspecified: Secondary | ICD-10-CM

## 2023-09-14 DIAGNOSIS — Z85820 Personal history of malignant melanoma of skin: Secondary | ICD-10-CM | POA: Insufficient documentation

## 2023-09-14 DIAGNOSIS — I6523 Occlusion and stenosis of bilateral carotid arteries: Secondary | ICD-10-CM

## 2023-09-14 LAB — BAYER DCA HB A1C WAIVED: HB A1C (BAYER DCA - WAIVED): 6.8 % — ABNORMAL HIGH (ref 4.8–5.6)

## 2023-09-14 NOTE — Assessment & Plan Note (Signed)
Chronic, ongoing.  Continue current medication regimen and adjust as needed based on labs.  Thyroid labs today. 

## 2023-09-14 NOTE — Assessment & Plan Note (Addendum)
 Chronic, follows with cardiology.  Rhythm regular on exam today.  Continue current medication regimen as prescribed by cardiology at this time and adjust as needed.  Recent cardiology note reviewed.  Continue collaboration, appreciate their input.

## 2023-09-14 NOTE — Assessment & Plan Note (Signed)
 Chronic, stable.  Noted on past imaging, may benefit return to vascular in future.  Continue preventative medications.  Last imaging with cardiology September 2022, stable.  Consider repeat this year.

## 2023-09-14 NOTE — Assessment & Plan Note (Signed)
 Chronic, ongoing.  A1c 6.8% today, trending down with 7 lbs weight loss since January.  Praised for this. Urine Alb 80 (January 2025).   - Continue Ozempic  2 MG weekly + continue Metformin  ER 500 MG BID.  Discussed with patient may benefit in future change to Mounjaro or Farxiga and discontinuation Metformin  due to kidney function.   Renal function limits medications that can be used + cost -- donut with Medicare.  A1c is at goal per diabetes association and geriatric society based on age. - Recommend he check blood sugar twice a day, in morning fasting and 2 hours after a meal, document these.   - Collaborate with PharmD Bartholomew Light. - Could consider a very low dose sulfonyurea if needed to keep cost low in future and avoid insulin.  At this time recommend heavy diet focus and regular exercise. - Return in 3 months.

## 2023-09-14 NOTE — Assessment & Plan Note (Signed)
Ongoing.  Noted on exam, on anticoagulant therapy for a-fib.  Recommend gentle skin care at home and monitor for wounds, if present then immediately notify provider.

## 2023-09-14 NOTE — Assessment & Plan Note (Signed)
 Chronic, stable.  Recheck CBC every 6 months (ordered today), no current symptoms.  Previously followed by Dr. Creed Dodrill.  CBC next visit - for Q6MOS check = January and June.

## 2023-09-14 NOTE — Assessment & Plan Note (Signed)
 Chronic, stable. BP below goal in office today and on home readings.  Recommend he monitor BP at least a few mornings a week at home and document.  DASH diet at home.  Continue current medication regimen and adjust as needed.  Labs today: CMP.   Refills sent in.

## 2023-09-14 NOTE — Assessment & Plan Note (Signed)
 Refer to diabetes with CKD plan of care.

## 2023-09-14 NOTE — Progress Notes (Signed)
 BP 123/67   Pulse 75   Temp (!) 97.5 F (36.4 C) (Oral)   Ht 5\' 9"  (1.753 m)   Wt 193 lb 3.2 oz (87.6 kg)   SpO2 99%   BMI 28.53 kg/m    Subjective:    Patient ID: Jonathan [name], male    DOB: 1946-02-01, 78 y.o.   MRN: [id number]  HPI: Jonathan [name] is a 78 y.o. male  Chief Complaint  Patient presents with   Diabetes   Hyperlipidemia   Hypertension   Recently diagnosed with Melanoma upper right chest (IV) and left posterior shoulder (III).  He will be seeing derm oncology upcoming for further discussion and plan.  We discussed melanoma and possible next steps they may perform.  DIABETES January A1c 7%.  Continues Ozempic  2 MG and Metformin  XR 500 MG BID.  Unable to get Ozempic  through assistance.  Tried Invokana , but was discontinued in past due to kidney function.   Hypoglycemic episodes:yes Polydipsia/polyuria: no Visual disturbance: no Chest pain: no Paresthesias: no Glucose Monitoring: no  Accucheck frequency: Not Checking  Fasting glucose:  Post prandial:  Evening:  Before meals: Taking Insulin?: no  Long acting insulin:  Short acting insulin: Blood Pressure Monitoring: a few times a week Retinal Examination: Up to Date -- [facility] Foot Exam: Up to Date Pneumovax: Up to Date Influenza: Up to Date Aspirin: no   CHRONIC KIDNEY DISEASE (3b) Remains 3b  CKD status: stable Medications renally dose: yes Previous renal evaluation: no Pneumovax:  Up to Date Influenza Vaccine:  Up to Date   ATRIAL FIBRILLATION Continues Eliquis, Metoprolol , and Flecainide. Metoprolol  is making him very tired since they increased dosing back to 25 MG BID after recent cardioversion.  History: Dr. Andy Bannister from EP seen in 2021 about left atrial appendage closure device ([employer]), but opted not to do this. Last EF was > 55% on 02/09/21.  Last carotid doppler and stress test was on 02/09/21.  Atrial fibrillation status: stable Satisfied with current treatment: yes   Medication side effects:  no Medication compliance: good compliance Etiology of atrial fibrillation:  Palpitations:  no Chest pain:  no Dyspnea on exertion:  no Orthopnea:  no Syncope:  no Edema:  no Ventricular rate control: B-blocker Anti-coagulation: long acting   HYPERTENSION / HYPERLIPIDEMIA Continues to take Amlodipine. Lisinopril  stopped by cardiology in past due to elevation in K+ at 5.7.  Occasional low platelets on labs. Satisfied with current treatment? yes Duration of hypertension: chronic BP monitoring frequency: a few times a week BP range: average 120/70 range at home BP medication side effects: no Duration of hyperlipidemia: chronic Cholesterol medication side effects: no Cholesterol supplements: none Medication compliance: good compliance Aspirin: no Recent stressors: no Recurrent headaches: no Visual changes: no Palpitations: no Dyspnea: no Chest pain: no Lower extremity edema: no Dizzy/lightheaded: no   HYPOTHYROIDISM Takes Levothyroxine  125 MCG. Thyroid  control status:stable Satisfied with current treatment? yes Medication side effects: no Medication compliance: good compliance Etiology of hypothyroidism: unknown Recent dose adjustment:no Fatigue: no Cold intolerance: no Heat intolerance: no Weight gain: no Weight loss: no Constipation: yes -- has tried Dulcolax Diarrhea/loose stools: no Palpitations: no Lower extremity edema: no Anxiety/depressed mood: no      09/14/2023    8:43 AM 06/10/2023    9:53 AM 06/07/2023   11:35 AM 03/10/2023    9:46 AM 12/08/2022    8:38 AM  Depression screen PHQ 2/9  Decreased Interest 0 0 0 0 0  Down, Depressed,  Hopeless 1 1 1 1 1   PHQ - 2 Score 1 1 1 1 1   Altered sleeping 1 0  1 1  Tired, decreased energy 0 0  0 0  Change in appetite 0 0  0 0  Feeling bad or failure about yourself  0 0  0 0  Trouble concentrating 0 0  0 0  Moving slowly or fidgety/restless 0 0  0 0  Suicidal thoughts 0 0  0 0   PHQ-9 Score 2 1  2 2   Difficult doing work/chores Not difficult at all Not difficult at all  Not difficult at all Not difficult at all       09/14/2023    8:43 AM 06/10/2023    9:54 AM 03/10/2023    9:46 AM 12/08/2022    8:38 AM  GAD 7 : Generalized Anxiety Score  Nervous, Anxious, on Edge 1 1 0 0  Control/stop worrying 0 0 0 0  Worry too much - different things 0 0 1 1  Trouble relaxing 0 0 0 0  Restless 0 0 0 0  Easily annoyed or irritable 0 0 0 0  Afraid - awful might happen 0 0 0 0  Total GAD 7 Score 1 1 1 1   Anxiety Difficulty Not difficult at all Not difficult at all Not difficult at all Not difficult at all   Relevant past medical, surgical, family and social history reviewed and updated as indicated. Interim medical history since our last visit reviewed. Allergies and medications reviewed and updated.  Review of Systems  Constitutional:  Negative for activity change, diaphoresis, fatigue and fever.  Respiratory:  Negative for cough, chest tightness, shortness of breath and wheezing.   Cardiovascular:  Negative for chest pain, palpitations and leg swelling.  Gastrointestinal: Negative.   Endocrine: Negative for cold intolerance, heat intolerance, polydipsia, polyphagia and polyuria.  Neurological: Negative.   Psychiatric/Behavioral: Negative.     Per HPI unless specifically indicated above     Objective:    BP 123/67   Pulse 75   Temp (!) 97.5 F (36.4 C) (Oral)   Ht 5\' 9"  (1.753 m)   Wt 193 lb 3.2 oz (87.6 kg)   SpO2 99%   BMI 28.53 kg/m   Wt Readings from Last 3 Encounters:  09/14/23 193 lb 3.2 oz (87.6 kg)  06/22/23 198 lb 3.2 oz (89.9 kg)  06/14/23 200 lb (90.7 kg)    Physical Exam Vitals and nursing note reviewed.  Constitutional:      General: He is awake. He is not in acute distress.    Appearance: He is well-developed, well-groomed and overweight. He is not ill-appearing.  HENT:     Head: Normocephalic and atraumatic.     Right Ear: Hearing  normal. No drainage.     Left Ear: Hearing normal. No drainage.  Eyes:     General: Lids are normal.        Right eye: No discharge.        Left eye: No discharge.     Conjunctiva/sclera: Conjunctivae normal.     Pupils: Pupils are equal, round, and reactive to light.  Neck:     Thyroid : No thyromegaly.     Vascular: No carotid bruit.  Cardiovascular:     Rate and Rhythm: Normal rate and regular rhythm.     Heart sounds: Normal heart sounds, S1 normal and S2 normal. No murmur heard.    No gallop.     Comments: Rhythm regular today  on exam, no extra or irregular beats heard. Pulmonary:     Effort: Pulmonary effort is normal. No accessory muscle usage or respiratory distress.     Breath sounds: Normal breath sounds. No decreased breath sounds, wheezing or rhonchi.  Abdominal:     General: Bowel sounds are normal. There is no distension.     Palpations: Abdomen is soft.     Tenderness: There is no abdominal tenderness.  Musculoskeletal:        General: Normal range of motion.     Cervical back: Normal range of motion and neck supple.     Right lower leg: No edema.     Left lower leg: No edema.  Lymphadenopathy:     Cervical: No cervical adenopathy.  Skin:    General: Skin is warm and dry.     Comments: Scattered bruising bilateral upper extremities.  Neurological:     Mental Status: He is alert and oriented to person, place, and time.  Psychiatric:        Attention and Perception: Attention normal.        Mood and Affect: Mood normal.        Speech: Speech normal.        Behavior: Behavior normal. Behavior is cooperative.        Thought Content: Thought content normal.    Results for orders placed or performed in visit on 08/15/23  Anatomic Pathology Report   Collection Time: 08/15/23  9:34 AM  Result Value Ref Range   Diagnosis synopsis: Comment    Specimen: Comment    Clinical diagnosis: Comment    Diagnosis: Comment    Comment: Comment    Gross description: Comment     Electronically signed by: Comment    CPT code(s): Comment    CPT Disclaimer: Comment    Clinician provided ICD: Comment    Pathologist provided ICD: Comment       Assessment & Plan:   Problem List Items Addressed This Visit       Cardiovascular and Mediastinum   Senile purpura (HCC)   Ongoing.  Noted on exam, on anticoagulant therapy for a-fib.  Recommend gentle skin care at home and monitor for wounds, if present then immediately notify provider.      Paroxysmal A-fib (HCC)   Chronic, follows with cardiology.  Rhythm regular on exam today.  Continue current medication regimen as prescribed by cardiology at this time and adjust as needed.  Recent cardiology note reviewed.  Continue collaboration, appreciate their input.        Relevant Orders   Comprehensive metabolic panel with GFR   Lipid Panel w/o Chol/HDL Ratio   Hypertension associated with diabetes (HCC)   Chronic, stable. BP below goal in office today and on home readings.  Recommend he monitor BP at least a few mornings a week at home and document.  DASH diet at home.  Continue current medication regimen and adjust as needed.  Labs today: CMP.   Refills sent in.      Relevant Orders   Bayer DCA Hb A1c Waived   Comprehensive metabolic panel with GFR   Bilateral carotid artery stenosis   Chronic, stable.  Noted on past imaging, may benefit return to vascular in future.  Continue preventative medications.  Last imaging with cardiology September 2022, stable.  Consider repeat this year.        Endocrine   Type 2 diabetes mellitus with chronic kidney disease, without long-term current use of insulin (HCC) -  Primary   Chronic, ongoing.  A1c 6.8% today, trending down with 7 lbs weight loss since January.  Praised for this. Urine Alb 80 (January 2025).   - Continue Ozempic  2 MG weekly + continue Metformin  ER 500 MG BID.  Discussed with patient may benefit in future change to Mounjaro or Farxiga and discontinuation Metformin   due to kidney function.   Renal function limits medications that can be used + cost -- donut with Medicare.  A1c is at goal per diabetes association and geriatric society based on age. - Recommend he check blood sugar twice a day, in morning fasting and 2 hours after a meal, document these.   - Collaborate with PharmD Bartholomew Light. - Could consider a very low dose sulfonyurea if needed to keep cost low in future and avoid insulin.  At this time recommend heavy diet focus and regular exercise. - Return in 3 months.      Relevant Orders   Bayer DCA Hb A1c Waived   Hypothyroidism   Chronic, ongoing.  Continue current medication regimen and adjust as needed based on labs.  Thyroid  labs today.      Hyperlipidemia associated with type 2 diabetes mellitus (HCC)   Chronic, ongoing.  Continue with current regimen and adjust as needed based on labs.  Lipid panel today.      Relevant Orders   Bayer DCA Hb A1c Waived   Comprehensive metabolic panel with GFR   Lipid Panel w/o Chol/HDL Ratio   Diabetes mellitus treated with injections of non-insulin medication (HCC)   Refer to diabetes with CKD plan of care.      Relevant Orders   Bayer DCA Hb A1c Waived     Musculoskeletal and Integument   Melanoma of skin (HCC)   New diagnosis to right upper chest (IV) and left posterior shoulder (III) he is scheduled to see specialist at [facility] upcoming.        Genitourinary   CKD (chronic kidney disease), stage III (HCC)   Chronic, ongoing CKD 3b.  Continue to collaborate with nephrology as needed and renal dose medication, Lisinopril  for kidney protection.  CMP today.  Urine Alb 80 (January 2025).      Relevant Orders   Comprehensive metabolic panel with GFR     Hematopoietic and Hemostatic   Thrombocytopenia (HCC)   Chronic, stable.  Recheck CBC every 6 months (ordered today), no current symptoms.  Previously followed by Dr. Creed Dodrill.  CBC next visit - for Q6MOS check = January and June.         Follow up plan: Return in about 3 months (around 12/14/2023) for T2DM, HTN/HLD.

## 2023-09-14 NOTE — Assessment & Plan Note (Signed)
Chronic, ongoing.  Continue with current regimen and adjust as needed based on labs.  Lipid panel today.

## 2023-09-14 NOTE — Assessment & Plan Note (Signed)
 New diagnosis to right upper chest (IV) and left posterior shoulder (III) he is scheduled to see specialist at [facility] upcoming.

## 2023-09-14 NOTE — Assessment & Plan Note (Signed)
 Chronic, ongoing CKD 3b.  Continue to collaborate with nephrology as needed and renal dose medication, Lisinopril for kidney protection.  CMP today.  Urine Alb 80 (January 2025).

## 2023-09-15 ENCOUNTER — Encounter: Payer: Self-pay | Admitting: Nurse Practitioner

## 2023-09-15 LAB — COMPREHENSIVE METABOLIC PANEL WITH GFR
ALT: 9 IU/L (ref 0–44)
AST: 17 IU/L (ref 0–40)
Albumin: 4.6 g/dL (ref 3.8–4.8)
Alkaline Phosphatase: 99 IU/L (ref 44–121)
BUN/Creatinine Ratio: 14 (ref 10–24)
BUN: 22 mg/dL (ref 8–27)
Bilirubin Total: 0.6 mg/dL (ref 0.0–1.2)
CO2: 23 mmol/L (ref 20–29)
Calcium: 9.6 mg/dL (ref 8.6–10.2)
Chloride: 100 mmol/L (ref 96–106)
Creatinine, Ser: 1.56 mg/dL — ABNORMAL HIGH (ref 0.76–1.27)
Globulin, Total: 2.2 g/dL (ref 1.5–4.5)
Glucose: 128 mg/dL — ABNORMAL HIGH (ref 70–99)
Potassium: 4.6 mmol/L (ref 3.5–5.2)
Sodium: 139 mmol/L (ref 134–144)
Total Protein: 6.8 g/dL (ref 6.0–8.5)
eGFR: 45 mL/min/{1.73_m2} — ABNORMAL LOW (ref >59)

## 2023-09-15 LAB — LIPID PANEL W/O CHOL/HDL RATIO
Cholesterol, Total: 132 mg/dL (ref 100–199)
HDL: 50 mg/dL (ref >39)
LDL Chol Calc (NIH): 61 mg/dL (ref 0–99)
Triglycerides: 117 mg/dL (ref 0–149)
VLDL Cholesterol Cal: 21 mg/dL (ref 5–40)

## 2023-09-15 NOTE — Progress Notes (Signed)
 Contacted via MyChart   Good afternoon Jonathan [name], your labs have returned and overall remain stable.  Kidney function continues to show Stage 3a to b kidney disease without worsening.  Lipid panel shows stable levels.  No medication changes needed.  Any questions? Keep being amazing!!  Thank you for allowing me to participate in your care.  I appreciate you. Kindest regards, Valiant Dills

## 2023-09-16 DIAGNOSIS — D485 Neoplasm of uncertain behavior of skin: Secondary | ICD-10-CM | POA: Diagnosis not present

## 2023-09-20 NOTE — Progress Notes (Signed)
 ------------------------------------------------------------------------------- Attestation signed by Tommas Alm Fallow, MD at 09/26/23 1242 Please see my teaching attestation. Alm LELON Tommas, MD  -------------------------------------------------------------------------------  Attending Surgeon: Dr. Alm Tommas  Referring physician: Jackquline Rexene Planas, MD [address] [facility] [place],  [place] [address]  PCP: [facility]  Reason for visit: Pt is seen today in consultation at the request of Dr. Rexene Jackquline for evaluation of melanomas of the right chest (pT2a) and left upper back (pT1b)  Subjective:     HISTORY OF PRESENT ILLNESS:  Patient is a 78 y.o. Caucasian male recently was seen by [facility] for several lesions of concern. Biopsies were taken of these and pathology showed a pT2a superficial spreading malignant melanoma of the right chest, Clark Level IV, Breslow thickness at least 1.1 mm, mitoses 0/mm^2, nuclear grade 1/3, ulceration absent, extends to edge and focally to base and a pT1b superficial spreading malignant melanoma of the left upper back, Clark Level IV, Breslow thickness 1.0 mm, mitoses 0/mm^2, nuclear grade 2/3, ulceration absent, margins narrowly free.  The patient reports no prior skin cancers. Patient reports that his skin tends to burns then tans with sun exposure. Patient grew up in [place], [place].  He had brown hair and brown eyes in childhood. He reports the following history of tanning bed use: none.  He takes Eliquis for afib and currently rhythm controlled, occasionally feels palpitations when he goes back into afib but this has not happened sine starting amlodipine.  History: PMH  Past Medical History:  Diagnosis Date  . CHF (congestive heart failure) 2012   Afib  . Chronic kidney disease 2012   Stage 3  . Diabetes mellitus 2006   Type 2  . Disease of thyroid  gland 2002   Low thyroid    PSH  Past Surgical  History:  Procedure Laterality Date  . COLONOSCOPY  2010   Take at home test    SOCIAL HISTORY:  Social History   Tobacco Use  . Smoking status: Former    Current packs/day: 0.00    Average packs/day: 1 pack/day for 10.0 years (10.0 ttl pk-yrs)    Types: Cigarettes    Start date: 05/1978    Quit date: 12/05/1980    Years since quitting: 42.8  . Smokeless tobacco: Former    Types: Snuff, Chew    Quit date: 06/07/2008  . Tobacco comments:    Occasional use  Vaping Use  . Vaping status: Never Used  Substance and Sexual Activity  . Alcohol use: Yes    Alcohol/week: 3.0 standard drinks of alcohol    Types: 3 Standard drinks or equivalent per week    Comment: An occasional drink  . Drug use: Never  . Sexual activity: Not Currently    Partners: Female    Birth control/protection: None   Social Drivers of Health   Financial Resource Strain: Low Risk  (06/07/2023)   Received from [facility]   Overall Financial Resource Strain (CARDIA)   . Difficulty of Paying Living Expenses: Not very hard  Food Insecurity: No Food Insecurity (06/07/2023)   Received from [facility]   Hunger Vital Sign   . Worried About Programme researcher, broadcasting/film/video in the Last Year: Never true   . Ran Out of Food in the Last Year: Never true  Transportation Needs: No Transportation Needs (06/07/2023)   Received from [facility] - Transportation   . Lack of Transportation (Medical): No   . Lack of Transportation (Non-Medical):  No  Physical Activity: Insufficiently Active (06/07/2023)   Received from [facility]   Exercise Vital Sign   . Days of Exercise per Week: 3 days   . Minutes of Exercise per Session: 40 min  Stress: Stress Concern Present (06/07/2023)   Received from [facility] - Occupational Stress Questionnaire   . Feeling of Stress : To some extent  Social Connections: Moderately Integrated (06/07/2023)   Received from [facility]   Social Connection  and Isolation Panel [NHANES]   . Frequency of Communication with Friends and Family: More than three times a week   . Frequency of Social Gatherings with Friends and Family: More than three times a week   . Attends Religious Services: More than 4 times per year   . Active Member of Clubs or Organizations: No   . Attends Banker Meetings: Never   . Marital Status: Married  Housing: Unknown (06/03/2023)   Received from [facility]   Housing Stability Vital Sign   . Homeless in the Last Year: No    FAMILY HISTORY: The patient's family history includes lung cancer in his father and mother; Stroke in his mother.  REVIEW OF SYSTEMS: Ten system review is negative except as noted in the HPI and balance of 10 system exam is negative.    Objective:     PHYSICAL EXAMINATION:  APPEARANCE: Well developed well nourished male in no acute distress SKIN: Full body skin exam does not reveal any abnormal lesions warranting biopsy. There is a biopsy site on the right chest without pigmentation or nodularity. There is a second biopsy site on the left upper back with residual pigmentation but no nodularity. Head to toe exam was completed. There are no other concerning lesions throughout the skin exam. LYMPHATICS: No palpable pre/post auricular, parotid, occipital, cervical, supraclavicular, axillary, or inguinal adenopathy   STUDIES:  PATHOLOGY:  Diagnosis  (Outside case, [id number], 7 slides, 08/15/2023)  1. Skin, right chest, shave - Malignant melanoma Type: Superficial spreading Clark Level: IV Breslow thickness: At least 1.1 mm Growth phase: Vertical, clonal appearing epithelioid melanocytes Mitoses: 0 per square millimeter Nuclear grade: 1/3 Tumor infiltrating lymphocytes: Non-brisk Regression: Not identified Ulceration: Not identified  Microscopic satellite: Not identified Vascular invasion:  Not identified Perineural invasion: Not identified Associated  nevus: Not identified  Margins: Extends to edge and focally to base Pathologic (pT, AJCC 8th edition) staging: pT2a   2. Skin, left upper back, shave - Malignant melanoma Type: Superficial spreading Clark Level: IV Breslow thickness: 1.0 mm Growth phase: Vertical, epithelioid melanocytes with light pigment Mitoses: 0 per square millimeter Nuclear grade: 2/3 Tumor infiltrating lymphocytes: Brisk Regression: Not identified Ulceration: Not identified  Microscopic satellite: Not identified Vascular invasion: Not identified Perineural invasion: Not identified Associated nevus: Not identified  Margins: Narrowly free Pathologic (pT, AJCC 8th edition) staging: pT1b   3. Skin, left shoulder, shave - Compound dysplastic nevus with moderate atypia - Although the lesion extends close to the margin, it appears to be completely excised    Assessment:     Jonathan [name] is 78 y.o. male who has been diagnosed with:  A pT2a superficial spreading malignant melanoma of the right chest, Breslow thickness at least 1.1 mm, mitoses 0/mm^2, ulceration absent, extends to edge and focally to base A pT1b superficial spreading malignant melanoma of the left upper back, Breslow thickness 1.0 mm, mitoses 0/mm^2, ulceration absent, margins narrowly free.    Plan:  Pt was seen today with Dr. Tommas. Due to the medical complexity of the patient's cancer and need for surgical treatment, Dr. Tommas examined the patient with me and spoke with the patient regarding surgical treatment.    We have discussed the diagnosis of melanoma. We have talked about the risks of nodal tumor involvement. We have recommended wide local excision of both melanoma sites with sentinel lymph node biopsy. The risks and benefits of this procedure have been reviewed with the patient today. Consent was signed in clinic.  We will tentatively proceed with surgery on 10/03/2023.  Prep for surgery, if indicated, was completed.     Jaewon J Lee, MD Surgical Oncology Fellow

## 2023-09-21 DIAGNOSIS — C4359 Malignant melanoma of other part of trunk: Secondary | ICD-10-CM | POA: Diagnosis not present

## 2023-09-22 NOTE — Unmapped External Note (Signed)
 Patient chart reviewed by [facility] at [facility].  Per Adult Anesthesia Algorithm, patient meets criteria for Anesthesia Evaluation as evidenced by Afib, Afllutter/ Eliquis  There is no height or weight on file to calculate BMI.  Procedure date: 5/12  Posted Procedure Location:  [facility]  Provider for procedure: Tommas Alm Fallow, MD   Appointment information:  patient will be contacted to schedule  Friday 5/9 @ 1300

## 2023-09-23 ENCOUNTER — Encounter: Payer: Self-pay | Admitting: Nurse Practitioner

## 2023-09-26 NOTE — Progress Notes (Signed)
 It was medically necessary for me to see the patient because a decision regarding surgery needed to be made.  I saw and examined the patient. I agree with the findings and the plan of care as documented in the fellow's and scribe's note.  BOTH of his melanomas need 1 cm circumferential margin excision, primary multilayer closure AND sentinel node procedures.  He is well-informed that both sites will be injected.  He is well-informed that 1 or both may cross midline.  This is precisely why I would like to do these at the same operative procedure.  I personally described the surgical procedure and associated risks in detail to the patient and his family.  All questions answered.  Informed consent signed.  Surgery scheduled.  Also, I have notified the division Chief and nuclear medicine, Sula Mouton, of this patient's unique situation.        Alm MICAEL Como MD [employer] Professor of Surgery

## 2023-09-30 DIAGNOSIS — Z01818 Encounter for other preprocedural examination: Secondary | ICD-10-CM | POA: Diagnosis not present

## 2023-10-02 NOTE — Anesthesia Preprocedure Evaluation (Signed)
 Procedure Information:  Date/Time: 10/03/23 1333   Procedures:      EXCISION, TUMOR, SOFT TISSUE OF BACK OR FLANK, SUBCUTANEOUS; 3 CM OR  GREATER (Left: Back)     BX/EXC LYMPH NODE; OPEN, DEEP AXILRY NODE (Left: Back) - 1.  Wide  local excision, primary multilayer closure, sentinel node procedure  2.  Wide local excision, primary multilayer closure, sentinel node procedure  TWO lymphoscintigram orders placed     INTRAOPERATIVE IDENTIFICATION SENTINEL LYMPH NODE(S) INCLUDE INJECTION  NON-RADIOACTIVE DYE, WHEN PERFORMED (Left: Back)     EXCISION, TUMOR, SOFT TISSUE OF NECK OR ANTERIOR THORAX, SUBCUTANEOUS;  3 CM OR GREATER (Right: Chest)   Anesthesia type: General   Pre-op diagnosis: 1.  T2 melanoma of the left upper back  2.  T2 melanoma  of the right anterior chest   Location: [facility] OR [facility] / OR [facility]   Surgeons: Tommas Alm Fallow, MD     Referring Provider:  Practice, [facility] [address] [place],  [place] [address]  PCP: Practice, [facility]   Risk Assessment and Recommendations  78 y.o. male who is ASA 3. Relevant Problems  No relevant active problems   - HTN - well controlled - meds per [facility] protocol  - A.fib/flutter - s/p ablation 14 years ago and recent cardioversion. On Eliquis - instructed to hole 2 days prior to procedure.  - CKD - stable - avoid nephrotoxic agents  - T2DM - well controlled - home meds per protocol  - Ozempic  - instructed to follow liquid diet for 24 hours. ACT to consider RSI.    Is the patient an appropriate [facility] candidate:yes Are there medications/diseases that can interfere with anesthesia: no History of difficult airway or positive predictors by physical exam: no Difficult IV access: no Avoid use of: N/A Objection to blood transfusion: no Pacemaker/AICD/bladder/bowel/spinal cord stimulator/LVAD/BIPAP: no  I reviewed with the patient the placement of standard lines and monitors, the risks and benefits of anesthesia types  including General anesthesia. Questions invited and answered.  Cardiovascular    BP Readings from Last 3 Encounters:  09/30/23 135/79  09/21/23 153/82    Medical Decision Making: I have personally reviewed the results of the laboratory studies previously ordered,  personally reviewed the old records as summarized above,     CHIEF COMPLAINT: Primary service requests perioperative anesthesia recommendations.    HPI:   78 y.o. male  here for perioperative anesthesia risk assessment and recommendations due to underlying chronic medical conditions prior to scheduled skin lesion excision and LN biopsy.  Past Medical History:  Diagnosis Date  . CHF (congestive heart failure)   2012   Afib  . Chronic kidney disease 2012   Stage 3  . Diabetes mellitus   2006   Type 2  . Disease of thyroid  gland 2002   Low thyroid        Past Surgical History:  Procedure Laterality Date  . COLONOSCOPY  2010   Take at home test    No current facility-administered medications for this encounter.   Current Outpatient Medications  Medication Sig Dispense Refill  . amlodipine (NORVASC) 5 MG tablet Take 1 tablet (5 mg total) by mouth daily.    . atorvastatin  (LIPITOR) 80 MG tablet Take 1 tablet (80 mg total) by mouth daily. TAKE 1 TABLET (80 MG TOTAL) BY MOUTH DAILY.    . cholecalciferol, vitamin D3 25 mcg, 1,000 units,, 1,000 unit (25 mcg) tablet Take 1 tablet (25 mcg total) by mouth  daily.    SABRA ELIQUIS 5 mg Tab Take 1 tablet (5 mg total) by mouth two (2) times a day.    . flecainide (TAMBOCOR) 50 MG tablet Take 2 tablets (100 mg total) by mouth two (2) times a day.    . levothyroxine  (SYNTHROID ) 125 MCG tablet Take 1 tablet (125 mcg total) by mouth daily. TAKE 1 TABLET (125 MCG TOTAL) BY MOUTH DAILY.    . magnesium  oxide (MAG-OX) 400 mg (241.3 mg elemental magnesium ) tablet Take 1 tablet by mouth daily. TAKE 1 TABLET (400 MG TOTAL) BY MOUTH DAILY.    . metFORMIN  (GLUCOPHAGE ) 500 MG tablet Take 1  tablet (500 mg total) by mouth two (2) times a day.    . metoPROLOL  tartrate (LOPRESSOR ) 25 MG tablet Take 1 tablet (25 mg total) by mouth two (2) times a day.    . multivitamin (TAB-A-VITE/THERAGRAN) per tablet Take 1 tablet by mouth daily.    . OZEMPIC  2 mg/dose (8 mg/3 mL) PnIj Inject under the skin every seven (7) days.       No Known Allergies   PFH:   Family History  Problem Relation Age of Onset  . Cancer Mother        Enlarged spleen  . Stroke Mother        [place] about 57 while she was in her sixties  . Cancer Father        Lung cancer     SH:  Social History   Tobacco Use  . Smoking status: Former    Current packs/day: 0.00    Average packs/day: 1 pack/day for 10.0 years (10.0 ttl pk-yrs)    Types: Cigarettes    Start date: 05/1978    Quit date: 12/05/1980    Years since quitting: 42.8  . Smokeless tobacco: Former    Types: Snuff, Chew    Quit date: 06/07/2008  . Tobacco comments:    Occasional use  Vaping Use  . Vaping status: Never Used  Substance Use Topics  . Alcohol use: Yes    Alcohol/week: 3.0 standard drinks of alcohol    Types: 3 Standard drinks or equivalent per week    Comment: An occasional drink  . Drug use: Never     Review of Systems:   12 systems reviewed and negative except as noted in the anesthesia evaluation portion of the note    Physical Examination:  There were no vitals filed for this visit.    Physical Exam  Constitutional: he is oriented to person, place, and time and well-developed, well-nourished, and in no distress.  HENT:  Head: Normocephalic and atraumatic.  Right Ear: External ear normal.  Left Ear: External ear normal.  Musculoskeletal: Normal range of motion. he exhibits no edema or deformity.  Neurological: he is alert and oriented to person, place, and time.  Skin: Skin is warm. No rash noted. he is not diaphoretic. No erythema.  Psychiatric: Mood, memory and affect normal.  Vitals reviewed.                Anesthesia Evaluation    History of anesthetic complications (PONV a very long time ago. Several GETAs since without issue.) PONV    Airway  Mallampati: II TM distance: >3 FB Jaw Protrusion: normal    Dental      Pulmonary - normal exam (+) former tobacco use Quit Year? 1980,   (-) sleep apnea  Cardiovascular - normal exam Exercise tolerance: good (METs >4) (+) hyperlipidemia, hypertension well  controlled, dysrhythmias (s/p ablation. a.flutter s/p cardioversion) and AFIB, CHF , PVD  ROS comment: Normal TTE 2022 - see Care Everywhere  Negative Treadmill Stress 06/2023 - see Care Everywhere   Neuro/Psych - negative ROS   GI/Hepatic/Renal   (+) chronic renal disease CKD  (-) GERD   Endo/Other   (+) diabetes mellitus type 2, hypothyroidism  Abdominal  - normal exam  OB/GYN      HEENT - negative ROS     Additional Comments:  On Ozempic  On Eliquis - held for procedure                Anesthesia Plan  ASA 3   NPO Appropriate? Yes  Anesthetic:  General  Standard lines and monitors.  Type of induction: IV Airway: endotracheal tube                                          Post Procedure Pain Management:IV analgesics and oral pain medication.    Anesthesia plan and risk discussed with patient and spouse; informed consent obtained.     Plan discussed with anesthesiologist and CRNA. Serial Consent:no serial consent Date obtained:   Relevant Problems  No relevant active problems

## 2023-10-03 DIAGNOSIS — I509 Heart failure, unspecified: Secondary | ICD-10-CM | POA: Diagnosis not present

## 2023-10-03 DIAGNOSIS — M7989 Other specified soft tissue disorders: Secondary | ICD-10-CM | POA: Diagnosis not present

## 2023-10-03 DIAGNOSIS — E039 Hypothyroidism, unspecified: Secondary | ICD-10-CM | POA: Diagnosis not present

## 2023-10-03 DIAGNOSIS — Z7984 Long term (current) use of oral hypoglycemic drugs: Secondary | ICD-10-CM | POA: Diagnosis not present

## 2023-10-03 DIAGNOSIS — E785 Hyperlipidemia, unspecified: Secondary | ICD-10-CM | POA: Diagnosis not present

## 2023-10-03 DIAGNOSIS — E1122 Type 2 diabetes mellitus with diabetic chronic kidney disease: Secondary | ICD-10-CM | POA: Diagnosis not present

## 2023-10-03 DIAGNOSIS — N183 Chronic kidney disease, stage 3 unspecified: Secondary | ICD-10-CM | POA: Diagnosis not present

## 2023-10-03 DIAGNOSIS — I4891 Unspecified atrial fibrillation: Secondary | ICD-10-CM | POA: Diagnosis not present

## 2023-10-03 DIAGNOSIS — I130 Hypertensive heart and chronic kidney disease with heart failure and stage 1 through stage 4 chronic kidney disease, or unspecified chronic kidney disease: Secondary | ICD-10-CM | POA: Diagnosis not present

## 2023-10-03 DIAGNOSIS — Z85820 Personal history of malignant melanoma of skin: Secondary | ICD-10-CM | POA: Diagnosis not present

## 2023-10-03 DIAGNOSIS — Z7901 Long term (current) use of anticoagulants: Secondary | ICD-10-CM | POA: Diagnosis not present

## 2023-10-03 DIAGNOSIS — Z87891 Personal history of nicotine dependence: Secondary | ICD-10-CM | POA: Diagnosis not present

## 2023-10-03 DIAGNOSIS — C4359 Malignant melanoma of other part of trunk: Secondary | ICD-10-CM | POA: Diagnosis not present

## 2023-10-03 DIAGNOSIS — Z7985 Long-term (current) use of injectable non-insulin antidiabetic drugs: Secondary | ICD-10-CM | POA: Diagnosis not present

## 2023-10-03 DIAGNOSIS — E079 Disorder of thyroid, unspecified: Secondary | ICD-10-CM | POA: Diagnosis not present

## 2023-10-03 NOTE — Interval H&P Note (Signed)
-------------------------------------------------------------------------------   Attestation signed by Tommas Alm Fallow, MD at 10/03/23 1220 TEACHING ATTESTATION: I personally interviewed and examined the patient.  I agree with the above assessment and plan.  Alm Tommas MD  -------------------------------------------------------------------------------  DAY OF SURGERY UPDATE  H&P reviewed. The patient was examined and there are no changes to the H&P.

## 2023-10-03 NOTE — Anesthesia Postprocedure Evaluation (Signed)
 Patient: Jonathan [name]  Scheduled: Procedure(s) with comments: EXCISION, TUMOR, SOFT TISSUE OF BACK OR FLANK, SUBCUTANEOUS; 3 CM OR GREATER BX/EXC LYMPH NODE; OPEN, DEEP AXILRY NODE - 1.  Wide local excision, primary multilayer closure, sentinel node procedure  2. Wide local excision, primary multilayer closure, sentinel node procedure  TWO lymphoscintigram orders placed INTRAOPERATIVE IDENTIFICATION SENTINEL LYMPH NODE(S) INCLUDE INJECTION NON-RADIOACTIVE DYE, WHEN PERFORMED EXCISION, TUMOR, SOFT TISSUE OF NECK OR ANTERIOR THORAX, SUBCUTANEOUS; 3 CM OR GREATER  Anesthesia Start Date/Time: 10/03/23 1220   Procedures:      EXCISION, TUMOR, SOFT TISSUE OF BACK OR FLANK, SUBCUTANEOUS; 3 CM OR  GREATER (Left: Back)     BX/EXC LYMPH NODE; OPEN, DEEP AXILRY NODE (Bilateral: Back) - 1.  Wide  local excision, primary multilayer closure, sentinel node procedure  2.  Wide local excision, primary multilayer closure, sentinel node procedure  TWO lymphoscintigram orders placed     INTRAOPERATIVE IDENTIFICATION SENTINEL LYMPH NODE(S) INCLUDE INJECTION  NON-RADIOACTIVE DYE, WHEN PERFORMED (Left: Back)     EXCISION, TUMOR, SOFT TISSUE OF NECK OR ANTERIOR THORAX, SUBCUTANEOUS;  3 CM OR GREATER (Right: Chest)   Anesthesia type: general   Pre-op diagnosis: 1.  T2 melanoma of the left upper back  2.  T2 melanoma  of the right anterior chest   Location: [facility] OR 05 [facility] / OR [facility]   Surgeons: Tommas Alm Fallow, MD     Final Anesthesia Type: general  Anesthesia Staff: Anesthesiologist: Claryce Marsa PARAS, MD Anesthesia Technician: Marcine Oneil RAMAN Anesthesia Provider: [place] , Terry Helling, CRNA      Type Details Placement Removal   Airway Placement Date: 10/03/23; Placement Time: 1229; Pre O2/Mask induction: Preoxygenated with 100% O2 by face mask; Mask ventilation: not attempted; Airway Device: LMA; Size: 4; Placed By: CRNA; Insertion attempts: 1; Placement Assessment: Equal Bilateral Breath  Sounds, Positive ETCO2; Intubation Trauma: Atraumatic Placement; Removal Date: 10/03/23; Removal Time: 1425 10/03/23 1229 by [place] , Terry Helling, CRNA 10/03/23 1425 by [place] , Terry Helling, CRNA       Patient Location: [facility] : [facility] PACU [facility] SURGERY  Last vitals:  Vitals Value Taken Time  BP 110/60 10/03/23 1505  Temp 36.5 C (97.7 F) 10/03/23 1505  Pulse 75 10/03/23 1505  Resp 12 10/03/23 1505  SpO2 97 % 10/03/23 1505  SpO2 Pulse 75 10/03/23 1505      Level of consciousness: alert and awake Post vitals: stable PONV Present? no Pain score: 0 Pain management: adequate Airway patency: patent Cardiovascular status: acceptable Respiratory status: acceptable Hydration status: acceptable  PACU PONV Meds: None  PACU Pain Meds: None  Anesthetic complications: There were no known notable events for this encounter.   _______________ Marsa PARAS Claryce, MD 10/03/23

## 2023-10-03 NOTE — Unmapped External Note (Signed)
 Operative Note  (CSN: [id number])  Date of Surgery: 10/03/2023  Pre-op Diagnosis: 1.  T2 melanoma of the left upper back  2.  T2 melanoma of the right anterior chest  Post-op Diagnosis: same  Procedure(s): EXCISION, TUMOR, SOFT TISSUE OF BACK OR FLANK, SUBCUTANEOUS; 3 CM OR GREATER: 21931 (CPT) BX/EXC LYMPH NODE; OPEN, DEEP AXILRY NODE: 61474 (CPT) INTRAOPERATIVE IDENTIFICATION SENTINEL LYMPH NODE(S) INCLUDE INJECTION NON-RADIOACTIVE DYE, WHEN PERFORMED: 38900 (CPT) EXCISION, TUMOR, SOFT TISSUE OF NECK OR ANTERIOR THORAX, SUBCUTANEOUS; 3 CM OR GREATER: [phone] (CPT)  Note: Revisions to procedures should be made in chart - see Procedures activity.  Operating Surgeon(s): Surgeon(s) and Role: Alm LELON Como, MD  Larnell Aid, MD     Assistant: None   Anesthesia: General   Estimated Blood Loss: 5 mL  Complications: None  Specimens:  ID Type Source Tests Collected by Time Destination  1 : Wide local excision left upper back melanoma, long lateral @ 9:00, short superior @ 12:00 Biopsy Back SURGICAL PATHOLOGY EXAM Como Alm Fallow, MD 10/03/2023 1313   2 : Left axillary sentinel node #1, blue hot: 2557, background: 8 Biopsy Axilla, Right SURGICAL PATHOLOGY EXAM Como Alm Fallow, MD 10/03/2023 1313   3 : Wide local rxcision Right anterior chest wall melanoma, long inferior lateral @ 9:00, short superior @ 12:00 Biopsy Chest SURGICAL PATHOLOGY EXAM Como Alm Fallow, MD 10/03/2023 1356   4 : Right axillary sentinel node, Blue Hot= 967, Background=4 Biopsy Lymph Node SURGICAL PATHOLOGY ERASMO Como Alm Fallow, MD 10/03/2023 1404     Operative Findings: See specimens, back defect 2.7 x 7.8 cm, defect chest 2.8 x 8.2   Indications: Patient is a 78 year old gentleman who has not 1 but 2 intermediate thickness melanomas.  1 is on the left upper back and the second is on the right anterior chest.  We have carefully coordinated this with a nuclear medicine.  Will do sentinel  node injections for both lesions.  My review of the preoperative lymphoscintigram reveals drainage to the left axilla from the left upper back lesion and drainage to the right axilla from the right anterior chest lesion.  2 separate sentinel node procedure will be performed.  The patient and his wife are well-informed the risks benefits and alternatives and request to proceed.  Procedure Description: In the preoperative holding area both lesions were marked verifying the locations using photographs.  He was then brought to the operating room for LMA anesthesia.  I injected the right anterior chest with 5 mL of Lymphazurin.  We then repositioned the patient in the right lateral decubitus position.  He was appropriately padded and then additional 5 mL of Lymphazurin injected around the patient's left upper back lesion.  He was then prepped and draped in usual sterile fashion.  After appropriate timeout we used a hand-held Geiger counter and identified a hotspot in the posterior axillary line.  Oblique incision was planned.  Skin subcutaneous tissues were infiltrated with 1% lidocaine  quarter percent Marcaine with epinephrine.  Skin incision was made.  Skin subcutaneous tissue divided hemostasis was achieved electrocautery.  The dissection was taken down through the underlying clavipectoral fascia.  Once we done this we identified a blue stained and radioactive node.  Ligaclips were placed on the channel and the vessels and the nodes were nucleated.  The ex vivo counts on the nodes measured 2557 and the residual background measured 8.  There is no palpable adenopathy.  Clearly we had identified his left axillary sentinel nodes from his  left upper back melanoma site.  Surgicel was placed in the base.  Clavipectoral fascia was reapproximated using a running 3-0 Vicryl suture.  Subcutaneous tissue and deep dermis reapproximated using interrupted 3-0 Vicryl suture.  Skin closed using a running 4-0 Vicryl subcuticular  stitch.  Dermabond will be applied at the end of the case.  We now turned our attention to the wide local excision site.  1 cm circumferential margins were marked.  Skin subcutaneous tissues were then infiltrated with 1% lidocaine  quarter percent Marcaine with epinephrine.  Skin incision was made.  Skin subcutaneous tissue divided hemostasis was achieved electrocautery.  The dissection taken all the way down to the fascia of the underlying latissimus dorsi and paraspinous musculature.  Specimen was amputated oriented and sent to pathology.  The defect measured 2.7 x 7.8 cm.  I then widely undermined subcutaneous flaps both left lateral and right lateral.  This allowed me to close the wound and a primary multilayer closure.  Surgicel was placed over the muscle fascia.  Deep subcutaneous tissues reapproximated using interrupted 2-0 Vicryl suture.  Superficial subcutaneous tissue and deep dermis reapproximated using interrupted 2 oh and interrupted 3-0 Vicryl suture.  Skin closed using a running 3-0 Vicryl subcuticular stitch in the midportion was buttressed with interrupted 3-0 nylon vertical mattress sutures.  Dermabond was applied.  Sterile dressing was applied.  We now repositioned the patient back into the supine position.  He is appropriately padded and prepped and draped in usual sterile fashion.  We used a Metallurgist identified a hotspot in the anterior right axillary line.  Skin incision was marked.  Skin subcutaneous tissues were infiltrated with 1% lidocaine  quarter percent Marcaine with epinephrine.  Skin incision was made.  Skin subcutaneous tissue divided hemostasis was achieved electrocautery.  Dissection was taken down into the underlying subcutaneous tissue.  We tracked a blue lymphatic channel to a blue stained and radioactive node.  Ligaclips were placed on the channel and the vessels and the node was enucleated.  The ex vivo counts on the right axillary sentinel nodes measured 967,  and the background measured 4.  There is no palpable adenopathy.  Clearly we had identified his right axillary sentinel nodes from his right chest melanoma.  Surgicel was placed in the base.  Clavipectoral fascia was reapproximated using running 3-0 Vicryl suture.  Subcutaneous tissue and deep dermis reapproximated using interrupted 3-0 Vicryl suture.  Skin closed using a running 4-0 Vicryl subcuticular stitch.  Dermabond will be applied at the end of the case.  Lastly we turned our attention to the chest.  1 cm circumferential margins were marked.  I transformed this into an oblique ellipse.  Skin subcutaneous tissues were infiltrated with 1% lidocaine  quarter percent Marcaine with epinephrine.  Skin incision was made.  Skin subcutaneous tissue divided hemostasis was achieved electrocautery.  The dissection was taken down through the underlying subcutaneous tissue.  The fascia the pectoralis major muscle serves the deep border.  Specimen is amputated oriented and sent to pathology.  The defect measureddefect chest 2.8 x 8.2.  I then elevated subcutaneous flaps both superior laterally and inferior medially.  This allowed me to close the wound in a primary multilayer closure. Surgicel was placed over the muscle fascia.  Deep subcutaneous tissues reapproximated using interrupted 2-0 Vicryl suture.  Superficial subcutaneous tissue and deep dermis reapproximated using interrupted 2 oh and interrupted 3-0 Vicryl suture.  Skin closed using a running 3-0 Vicryl subcuticular stitch in the midportion was buttressed  with interrupted 3-0 nylon vertical mattress sutures.  Dermabond was applied.  Sterile dressing was applied.  Patient tolerated the procedure well.  Synoptic Elements:  LEFT UPPER BACK:  Original Breslow thickness of the lesion: Invasive Depth to the tenth of a mm  1 Clinical margin of resection: 1 cm Did you excise down to the muscle fascia? Yes  RIGHT CHEST: Original Breslow thickness of the lesion:  Invasive Depth to the tenth of a mm  1.1 Clinical margin of resection: 1 cm Did you excise down to the muscle fascia? Yes  Attending Surgeon Attestation: I was present and scrubbed for the entire procedure.    Alm LELON Como, MD   Date: 10/03/2023  Time: 2:10 PM

## 2023-10-03 NOTE — Interval H&P Note (Signed)
 DAY OF SURGERY UPDATE    H&P reviewed. The patient was examined and there are no changes to the H&P.

## 2023-10-18 DIAGNOSIS — C4359 Malignant melanoma of other part of trunk: Secondary | ICD-10-CM | POA: Diagnosis not present

## 2023-10-18 NOTE — Progress Notes (Signed)
 Referring physician: Jackquline Rexene Planas, MD [address] [facility] [place],  [place] [address]  PCP: [facility]  ATTENDING PHYSICIAN Alm Como, MD  Diagnosis:Melanomas of the right chest (pT2a) and left upper back (pT1b) s/p excision and SLN biopsy   Subjective:    History of Present Illness: Jonathan [name] is a 78 y.o. male who presents for post-op follow up of melanoma x2 on the right chest and left upper back.  He underwent excisions and SLN biopsy.  He is healing well.       Objective:     GENERAL: Well-developed, well-nourished 78 y.o. male in no acute distress. SKIN Right chest and left back wound is healing well, no cellulitis or erythema. Bilateral axilla with healing SLN biopsy sites.         MEDICAL DECISION MAKING: PATHOLOGY: Diagnosis  A.  Left upper back, excision:   Skin and soft tissue with healing surgical wound and actinic damage. See synoptic report. Residual invasive melanoma is not identified. Margins are free.      B.  Sentinel lymph node, left axillary node #1 blue hot 2557, lymphadenectomy:   One lymph node, metastatic melanoma not identified (0/1). See comment.    C.  Right anterior chest, excision:   Skin and soft tissue with healing surgical wound and actinic damage. See synoptic report. Residual invasive melanoma is not identified. Margins are free.      D.  Sentinel lymph node, right axillary blue hot 967, lymphadenectomy:   One lymph node, metastatic melanoma not identified (0/1). See comment.    Electronically signed by Shanna LOISE Apa, MD on 10/06/2023 at 1622 EDT  Diagnosis Comment   B. Multiple level sections have been examined. To investigate for metastatic melanoma immunohistochemical studies with Melan-A/SOX10 cocktail and Melan-A, SOX10 are performed on block B1; which fail to reveal a definitive evidence of metastatic melanoma.   D. Multiple level sections have been examined. To investigate for  metastatic melanoma immunohistochemical studies with Melan-A/SOX10 cocktail and Melan-A, SOX10 are performed on blocks D1-D2; which fail to reveal a definitive evidence of metastatic melanoma.     Synoptic Report  MELANOMA OF THE SKIN: Excision, Re-Excision  8th Edition - Protocol posted: 11/10/2021MELANOMA OF THE SKIN: EXCISION, RE-EXCISION  - A, B SPECIMEN  Procedure  Excision    Sentinel node(s) biopsy  Specimen Laterality  Left  TUMOR  Tumor Site  Skin of trunk: Upper back  Multiple Primary Sites  Present: please see specimen C and D separate synoptic report      Histologic Type  No residual melanoma identified  Maximum Tumor (Breslow) Thickness (Millimeters)  1.0 mm  Macroscopic Satellite Nodule(s)  Not identified  Ulceration  Not identified  Anatomic (Clark) Level  IV (Melanoma invades reticular dermis)  Mitotic Rate  None identified  Microsatellite(s)  Not identified  Lymphovascular Invasion  Not identified  Neurotropism  Not identified  Tumor-Infiltrating Lymphocytes  Present, brisk  Tumor Regression  Not identified  MARGINS    Margin Status for Invasive Melanoma  All margins negative for invasive melanoma  REGIONAL LYMPH NODES    Regional Lymph Node Status  All regional lymph nodes negative for tumor  Total Number of Lymph Nodes Examined  1  Number of Sentinel Nodes Examined  1  PATHOLOGIC STAGE CLASSIFICATION (pTNM, AJCC 8th Edition)  Classification assigned in this report includes information from a prior procedure: [id number]    pT Category  pT1b  pN Category  pN0  .  MELANOMA  OF THE SKIN: Excision, Re-Excision  8th Edition - Protocol posted: 11/10/2021MELANOMA OF THE SKIN: EXCISION, RE-EXCISION  - C, D SPECIMEN  Procedure  Excision  Specimen Laterality  Right  TUMOR  Tumor Site  Skin of trunk: Anterior chest  Multiple Primary Sites  Present: please see specimen A and B separate synoptic report      Histologic Type  No residual melanoma identified  Maximum  Tumor (Breslow) Thickness (Millimeters)  1.1 mm  Macroscopic Satellite Nodule(s)  Not identified  Ulceration  Not identified  Anatomic (Clark) Level  IV (Melanoma invades reticular dermis)  Mitotic Rate  None identified  Microsatellite(s)  Not identified  Lymphovascular Invasion  Not identified  Neurotropism  Not identified  Tumor-Infiltrating Lymphocytes  Present, nonbrisk  Tumor Regression  Not identified  MARGINS    Margin Status for Invasive Melanoma  All margins negative for invasive melanoma  REGIONAL LYMPH NODES    Regional Lymph Node Status  All regional lymph nodes negative for tumor  Total Number of Lymph Nodes Examined  1  Number of Sentinel Nodes Examined  1  PATHOLOGIC STAGE CLASSIFICATION (pTNM, AJCC 8th Edition)  Classification assigned in this report includes information from a prior procedure: [id number]    pT Category  pT2a  pN Category  pN0      Assessment:    Melanoma T2aN0 Right chest Melanoma T1bN0 of the back    Plan:    Sutures removed.  He will slowly return to normal activities He will return back in 6 months  Pathology was reviewed with the patient.  We discussed follow up also with dermatology

## 2023-10-27 ENCOUNTER — Other Ambulatory Visit: Payer: Self-pay | Admitting: Nurse Practitioner

## 2023-10-28 NOTE — Telephone Encounter (Signed)
 Requested Prescriptions  Pending Prescriptions Disp Refills   Semaglutide , 2 MG/DOSE, (OZEMPIC , 2 MG/DOSE,) 8 MG/3ML SOPN [Pharmacy Med Name: OZEMPIC  8 MG/3 ML (2 MG/DOSE)] 9 mL 0    Sig: INJECT 2 MG AS DIRECTED ONCE A WEEK.     Endocrinology:  Diabetes - GLP-1 Receptor Agonists - semaglutide  Failed - 10/28/2023  9:26 AM      Failed - HBA1C in normal range and within 180 days    HB A1C (BAYER DCA - WAIVED)  Date Value Ref Range Status  09/14/2023 6.8 (H) 4.8 - 5.6 % Final    Comment:             Prediabetes: 5.7 - 6.4          Diabetes: >6.4          Glycemic control for adults with diabetes: <7.0          Failed - Cr in normal range and within 360 days    Creatinine  Date Value Ref Range Status  11/15/2011 1.41 (H) 0.60 - 1.30 mg/dL Final   Creatinine, Ser  Date Value Ref Range Status  09/14/2023 1.56 (H) 0.76 - 1.27 mg/dL Final         Passed - Valid encounter within last 6 months    Recent Outpatient Visits           1 month ago Type 2 diabetes mellitus with stage 3b chronic kidney disease, without long-term current use of insulin (HCC)   [facility] Lemar Pyles, NP       Future Appointments             In 1 month Artemio Larry, MD [facility]

## 2023-11-01 DIAGNOSIS — C4359 Malignant melanoma of other part of trunk: Secondary | ICD-10-CM | POA: Diagnosis not present

## 2023-11-09 DIAGNOSIS — C4359 Malignant melanoma of other part of trunk: Secondary | ICD-10-CM | POA: Diagnosis not present

## 2023-11-28 DIAGNOSIS — C4359 Malignant melanoma of other part of trunk: Secondary | ICD-10-CM | POA: Diagnosis not present

## 2023-11-28 NOTE — Progress Notes (Signed)
 Referring physician: Jackquline Rexene Planas, MD [address] [place] [facility] [place],  [place] [address]  PCP: [facility]  ATTENDING PHYSICIAN Alm Como, MD  Diagnosis:Melanomas of the right chest (pT2a) and left upper back (pT1b) s/p excision and SLN biopsy 10/03/2023  Subjective:    History of Present Illness: Jonathan [name] is a 78 y.o. male who presents for post-op follow up of melanoma x2 on the right chest and left upper back.  He underwent excisions and SLN biopsy.  His back wound has had some difficulties with healing and fluid.  He returns for follow up    Objective:     GENERAL: Well-developed, well-nourished 78 y.o. male in no acute distress. SKIN Back wound continues to heal with healthy granulating tissue       MEDICAL DECISION MAKING: PATHOLOGY: Diagnosis  A.  Left upper back, excision:   Skin and soft tissue with healing surgical wound and actinic damage. See synoptic report. Residual invasive melanoma is not identified. Margins are free.      B.  Sentinel lymph node, left axillary node #1 blue hot 2557, lymphadenectomy:   One lymph node, metastatic melanoma not identified (0/1). See comment.    C.  Right anterior chest, excision:   Skin and soft tissue with healing surgical wound and actinic damage. See synoptic report. Residual invasive melanoma is not identified. Margins are free.      D.  Sentinel lymph node, right axillary blue hot 967, lymphadenectomy:   One lymph node, metastatic melanoma not identified (0/1). See comment.    Electronically signed by Shanna LOISE Apa, MD on 10/06/2023 at 1622 EDT  Diagnosis Comment   B. Multiple level sections have been examined. To investigate for metastatic melanoma immunohistochemical studies with Melan-A/SOX10 cocktail and Melan-A, SOX10 are performed on block B1; which fail to reveal a definitive evidence of metastatic melanoma.   D. Multiple level sections have been examined. To  investigate for metastatic melanoma immunohistochemical studies with Melan-A/SOX10 cocktail and Melan-A, SOX10 are performed on blocks D1-D2; which fail to reveal a definitive evidence of metastatic melanoma.     Synoptic Report  MELANOMA OF THE SKIN: Excision, Re-Excision  8th Edition - Protocol posted: 11/10/2021MELANOMA OF THE SKIN: EXCISION, RE-EXCISION  - A, B SPECIMEN  Procedure  Excision    Sentinel node(s) biopsy  Specimen Laterality  Left  TUMOR  Tumor Site  Skin of trunk: Upper back  Multiple Primary Sites  Present: please see specimen C and D separate synoptic report      Histologic Type  No residual melanoma identified  Maximum Tumor (Breslow) Thickness (Millimeters)  1.0 mm  Macroscopic Satellite Nodule(s)  Not identified  Ulceration  Not identified  Anatomic (Clark) Level  IV (Melanoma invades reticular dermis)  Mitotic Rate  None identified  Microsatellite(s)  Not identified  Lymphovascular Invasion  Not identified  Neurotropism  Not identified  Tumor-Infiltrating Lymphocytes  Present, brisk  Tumor Regression  Not identified  MARGINS    Margin Status for Invasive Melanoma  All margins negative for invasive melanoma  REGIONAL LYMPH NODES    Regional Lymph Node Status  All regional lymph nodes negative for tumor  Total Number of Lymph Nodes Examined  1  Number of Sentinel Nodes Examined  1  PATHOLOGIC STAGE CLASSIFICATION (pTNM, AJCC 8th Edition)  Classification assigned in this report includes information from a prior procedure: [id number]    pT Category  pT1b  pN Category  pN0  .  MELANOMA OF THE SKIN:  Excision, Re-Excision  8th Edition - Protocol posted: 11/10/2021MELANOMA OF THE SKIN: EXCISION, RE-EXCISION  - C, D SPECIMEN  Procedure  Excision  Specimen Laterality  Right  TUMOR  Tumor Site  Skin of trunk: Anterior chest  Multiple Primary Sites  Present: please see specimen A and B separate synoptic report      Histologic Type  No residual melanoma  identified  Maximum Tumor (Breslow) Thickness (Millimeters)  1.1 mm  Macroscopic Satellite Nodule(s)  Not identified  Ulceration  Not identified  Anatomic (Clark) Level  IV (Melanoma invades reticular dermis)  Mitotic Rate  None identified  Microsatellite(s)  Not identified  Lymphovascular Invasion  Not identified  Neurotropism  Not identified  Tumor-Infiltrating Lymphocytes  Present, nonbrisk  Tumor Regression  Not identified  MARGINS    Margin Status for Invasive Melanoma  All margins negative for invasive melanoma  REGIONAL LYMPH NODES    Regional Lymph Node Status  All regional lymph nodes negative for tumor  Total Number of Lymph Nodes Examined  1  Number of Sentinel Nodes Examined  1  PATHOLOGIC STAGE CLASSIFICATION (pTNM, AJCC 8th Edition)  Classification assigned in this report includes information from a prior procedure: [id number]    pT Category  pT2a  pN Category  pN0      Assessment:    Melanoma T2aN0 Right chest Melanoma T1bN0 of the back    Plan:  Patient will continue with wound care and use silvadene to the wound.  He will return August 20th as this date works best.

## 2023-12-12 ENCOUNTER — Ambulatory Visit (INDEPENDENT_AMBULATORY_CARE_PROVIDER_SITE_OTHER): Admitting: Dermatology

## 2023-12-12 ENCOUNTER — Encounter: Payer: Self-pay | Admitting: Dermatology

## 2023-12-12 DIAGNOSIS — D692 Other nonthrombocytopenic purpura: Secondary | ICD-10-CM

## 2023-12-12 DIAGNOSIS — L578 Other skin changes due to chronic exposure to nonionizing radiation: Secondary | ICD-10-CM

## 2023-12-12 DIAGNOSIS — Z1283 Encounter for screening for malignant neoplasm of skin: Secondary | ICD-10-CM | POA: Diagnosis not present

## 2023-12-12 DIAGNOSIS — L570 Actinic keratosis: Secondary | ICD-10-CM

## 2023-12-12 DIAGNOSIS — Z86018 Personal history of other benign neoplasm: Secondary | ICD-10-CM | POA: Diagnosis not present

## 2023-12-12 DIAGNOSIS — L814 Other melanin hyperpigmentation: Secondary | ICD-10-CM | POA: Diagnosis not present

## 2023-12-12 DIAGNOSIS — L709 Acne, unspecified: Secondary | ICD-10-CM

## 2023-12-12 DIAGNOSIS — Z85820 Personal history of malignant melanoma of skin: Secondary | ICD-10-CM | POA: Diagnosis not present

## 2023-12-12 DIAGNOSIS — D229 Melanocytic nevi, unspecified: Secondary | ICD-10-CM

## 2023-12-12 DIAGNOSIS — D1801 Hemangioma of skin and subcutaneous tissue: Secondary | ICD-10-CM

## 2023-12-12 DIAGNOSIS — L821 Other seborrheic keratosis: Secondary | ICD-10-CM | POA: Diagnosis not present

## 2023-12-12 DIAGNOSIS — W908XXA Exposure to other nonionizing radiation, initial encounter: Secondary | ICD-10-CM | POA: Diagnosis not present

## 2023-12-12 DIAGNOSIS — D225 Melanocytic nevi of trunk: Secondary | ICD-10-CM | POA: Diagnosis not present

## 2023-12-12 NOTE — Progress Notes (Signed)
 Follow-Up Visit   Subjective  Jonathan [name] is a 78 y.o. male who presents for the following: Skin Cancer Screening and Full Body Skin Exam  The patient presents for Total-Body Skin Exam (TBSE) for skin cancer screening and mole check. The patient has spots, moles and lesions to be evaluated, some may be new or changing. History of melanoma at right chest and left upper back, WLE and SLN bx (negative) 10/03/2023 by Dr. Tommas. Patient is treating left upper back site with silvadene cream since the middle opened up. Hx of DN - mod, left shoulder. He has follow-up with Dr. Tommas in 4 months.    This patient is accompanied in the office by his spouse.   The following portions of the chart were reviewed this encounter and updated as appropriate: medications, allergies, medical history  Review of Systems:  No other skin or systemic complaints except as noted in HPI or Assessment and Plan.  Objective  Well appearing patient in no apparent distress; mood and affect are within normal limits.  A full examination was performed including scalp, head, eyes, ears, nose, lips, neck, chest, axillae, abdomen, back, bilateral upper extremities, bilateral lower extremities, hands, feet, fingers, toes, fingernails, and toenails. All findings within normal limits unless otherwise noted below.   Relevant physical exam findings are noted in the Assessment and Plan.  vertex scalp x 7 (7) Pink scaly macules.   Assessment & Plan   SKIN CANCER SCREENING PERFORMED TODAY.  ACTINIC DAMAGE - Chronic condition, secondary to cumulative UV/sun exposure - diffuse scaly erythematous macules with underlying dyspigmentation - Recommend daily broad spectrum sunscreen SPF 30+ to sun-exposed areas, reapply every 2 hours as needed.  - Staying in the shade or wearing long sleeves, sun glasses (UVA+UVB protection) and wide brim hats (4-inch brim around the entire circumference of the hat) are also recommended for sun  protection.  - Call for new or changing lesions.  LENTIGINES, SEBORRHEIC KERATOSES, HEMANGIOMAS - Benign normal skin lesions - Benign-appearing - Call for any changes  MELANOCYTIC NEVI - Tan-brown and/or pink-flesh-colored symmetric macules and papules - 4 mm regular brown macule at right spinal lower back  - Benign appearing on exam today - Observation - Call clinic for new or changing moles - Recommend daily use of broad spectrum spf 30+ sunscreen to sun-exposed areas.   SEBORRHEIC KERATOSIS - 5 mm speckled thin waxy papule at posterior lower neck - Benign-appearing - Discussed benign etiology and prognosis. - Observe - Call for any changes  HISTORY OF MELANOMA Right Chest 1.1 mm, Level IV, WLE 10/03/2023, SLN neg Left Upper Back, 1.0 mm, Level IV, WLE 10/03/2023, SLN neg - No evidence of recurrence today - No lymphadenopathy - Recommend regular full body skin exams - Recommend daily broad spectrum sunscreen SPF 30+ to sun-exposed areas, reapply every 2 hours as needed.  - Call if any new or changing lesions are noted between office visits  HISTORY OF DYSPLASTIC NEVUS Left shoulder, moderate, 08/15/2023 No evidence of recurrence today Recommend regular full body skin exams Recommend daily broad spectrum sunscreen SPF 30+ to sun-exposed areas, reapply every 2 hours as needed.  Call if any new or changing lesions are noted between office visits  Dilated Pore of Winer at mid back Exam: dilated pore at mid back   Treatment Plan: Benign-appearing.  Observation.  Call clinic for new or changing lesions.  Recommend daily use of broad spectrum spf 30+ sunscreen to sun-exposed areas.    Purpura - Chronic;  persistent and recurrent.  Treatable, but not curable. - Violaceous macules and patches - Benign - Related to trauma, age, sun damage and/or use of blood thinners, chronic use of topical and/or oral steroids - Observe - Can use OTC arnica containing moisturizer such as Dermend  Bruise Formula if desired - Call for worsening or other concerns  AK (ACTINIC KERATOSIS) (7) vertex scalp x 7 (7) Actinic keratoses are precancerous spots that appear secondary to cumulative UV radiation exposure/sun exposure over time. They are chronic with expected duration over 1 year. A portion of actinic keratoses will progress to squamous cell carcinoma of the skin. It is not possible to reliably predict which spots will progress to skin cancer and so treatment is recommended to prevent development of skin cancer.  Recommend daily broad spectrum sunscreen SPF 30+ to sun-exposed areas, reapply every 2 hours as needed.  Recommend staying in the shade or wearing long sleeves, sun glasses (UVA+UVB protection) and wide brim hats (4-inch brim around the entire circumference of the hat). Call for new or changing lesions. Destruction of lesion - vertex scalp x 7 (7)  Destruction method: cryotherapy   Informed consent: discussed and consent obtained   Lesion destroyed using liquid nitrogen: Yes   Region frozen until ice ball extended beyond lesion: Yes   Outcome: patient tolerated procedure well with no complications   Post-procedure details: wound care instructions given   Additional details:  Prior to procedure, discussed risks of blister formation, small wound, skin dyspigmentation, or rare scar following cryotherapy. Recommend Vaseline ointment to treated areas while healing.   Return in about 6 months (around 06/13/2024) for TBSE, Hx melanoma, Hx Dysplastic Nevus.  IAndrea Kerns, CMA, am acting as scribe for Rexene Rattler, MD .   Documentation: I have reviewed the above documentation for accuracy and completeness, and I agree with the above.  Rexene Rattler, MD

## 2023-12-12 NOTE — Patient Instructions (Addendum)

## 2023-12-24 NOTE — Patient Instructions (Signed)
Be Involved in Caring For Your Health:  Taking Medications When medications are taken as directed, they can greatly improve your health. But if they are not taken as prescribed, they may not work. In some cases, not taking them correctly can be harmful. To help ensure your treatment remains effective and safe, understand your medications and how to take them. Bring your medications to each visit for review by your provider.  Your lab results, notes, and after visit summary will be available on My Chart. We strongly encourage you to use this feature. If lab results are abnormal the clinic will contact you with the appropriate steps. If the clinic does not contact you assume the results are satisfactory. You can always view your results on My Chart. If you have questions regarding your health or results, please contact the clinic during office hours. You can also ask questions on My Chart.  We at [facility] are grateful that you chose [place] to provide your care. We strive to provide evidence-based and compassionate care and are always looking for feedback. If you get a survey from the clinic please complete this so we can hear your opinions.  Diabetes Mellitus and Exercise Regular exercise is important for your health, especially if you have diabetes mellitus. Exercise is not just about losing weight. It can also help you increase muscle strength and bone density and reduce body fat and stress. This can help your level of endurance and make you more fit and flexible. Why should I exercise if I have diabetes? Exercise has many benefits for people with diabetes. It can: Help lower and control your blood sugar (glucose). Help your body respond better and become more sensitive to the hormone insulin. Reduce how much insulin your body needs. Lower your risk for heart disease by: Lowering how much "bad" cholesterol and triglycerides you have in your body. Increasing how much "good" cholesterol  you have in your body. Lowering your blood pressure. Lowering your blood glucose levels. What is my activity plan? Your health care provider or an expert trained in diabetes care (certified diabetes educator) can help you make an activity plan. This plan can help you find the type of exercise that works for you. It may also tell you how often to exercise and for how long. Be sure to: Get at least 150 minutes of medium-intensity or high-intensity exercise each week. This may involve brisk walking, biking, or water aerobics. Do stretching and strengthening exercises at least 2 times a week. This may involve yoga or weight lifting. Spread out your activity over at least 3 days of the week. Get some form of physical activity each day. Do not go more than 2 days in a row without some kind of activity. Avoid being inactive for more than 30 minutes at a time. Take frequent breaks to walk or stretch. Choose activities that you enjoy. Set goals that you know you can accomplish. Start slowly and increase the intensity of your exercise over time. How do I manage my diabetes during exercise?  Monitor your blood glucose Check your blood glucose before and after you exercise. If your blood glucose is 240 mg/dL (40.9 mmol/L) or higher before you exercise, check your urine for ketones. These are chemicals created by the liver. If you have ketones in your urine, do not exercise until your blood glucose returns to normal. If your blood glucose is 100 mg/dL (5.6 mmol/L) or lower, eat a snack that has 15-20 grams of carbohydrate in  it. Check your blood glucose 15 minutes after the snack to make sure that your level is above 100 mg/dL (5.6 mmol/L) before you start to exercise. Your risk for low blood glucose (hypoglycemia) goes up during and after exercise. Know the symptoms of this condition and how to treat it. Follow these instructions at home: Keep a carbohydrate snack on hand for use before, during, and after  exercise. This can help prevent or treat hypoglycemia. Avoid injecting insulin into parts of your body that are going to be used during exercise. This may include: Your arms, when you are going to play tennis. Your legs, when you are about to go jogging. Keep track of your exercise habits. This can help you and your health care provider watch and adjust your activity plan. Write down: What you eat before and after you exercise. Blood glucose levels before and after you exercise. The type and amount of exercise you do. Talk to your health care provider before you start a new activity. They may need to: Make sure that the activity is safe for you. Adjust your insulin, other medicines, and food that you eat. Drink water while you exercise. This can stop you from losing too much water (dehydration). It can also prevent problems caused by having a lot of heat in your body (heat stroke). Where to find more information American Diabetes Association: diabetes.org Association of Diabetes Care & Education Specialists: diabeteseducator.org This information is not intended to replace advice given to you by your health care provider. Make sure you discuss any questions you have with your health care provider. Document Revised: 10/28/2021 Document Reviewed: 10/28/2021 Elsevier Patient Education  2024 [employer].

## 2023-12-28 ENCOUNTER — Ambulatory Visit: Admitting: Nurse Practitioner

## 2023-12-28 ENCOUNTER — Encounter: Payer: Self-pay | Admitting: Nurse Practitioner

## 2023-12-28 VITALS — BP 113/66 | HR 72 | Temp 98.3°F | Ht 69.0 in | Wt 199.2 lb

## 2023-12-28 DIAGNOSIS — D692 Other nonthrombocytopenic purpura: Secondary | ICD-10-CM | POA: Diagnosis not present

## 2023-12-28 DIAGNOSIS — E1122 Type 2 diabetes mellitus with diabetic chronic kidney disease: Secondary | ICD-10-CM | POA: Diagnosis not present

## 2023-12-28 DIAGNOSIS — E1159 Type 2 diabetes mellitus with other circulatory complications: Secondary | ICD-10-CM

## 2023-12-28 DIAGNOSIS — E538 Deficiency of other specified B group vitamins: Secondary | ICD-10-CM

## 2023-12-28 DIAGNOSIS — Z7985 Long-term (current) use of injectable non-insulin antidiabetic drugs: Secondary | ICD-10-CM

## 2023-12-28 DIAGNOSIS — E1169 Type 2 diabetes mellitus with other specified complication: Secondary | ICD-10-CM | POA: Diagnosis not present

## 2023-12-28 DIAGNOSIS — C439 Malignant melanoma of skin, unspecified: Secondary | ICD-10-CM | POA: Diagnosis not present

## 2023-12-28 DIAGNOSIS — E119 Type 2 diabetes mellitus without complications: Secondary | ICD-10-CM | POA: Diagnosis not present

## 2023-12-28 DIAGNOSIS — D696 Thrombocytopenia, unspecified: Secondary | ICD-10-CM

## 2023-12-28 DIAGNOSIS — E039 Hypothyroidism, unspecified: Secondary | ICD-10-CM

## 2023-12-28 DIAGNOSIS — N1832 Chronic kidney disease, stage 3b: Secondary | ICD-10-CM

## 2023-12-28 DIAGNOSIS — I480 Paroxysmal atrial fibrillation: Secondary | ICD-10-CM

## 2023-12-28 DIAGNOSIS — I152 Hypertension secondary to endocrine disorders: Secondary | ICD-10-CM | POA: Diagnosis not present

## 2023-12-28 DIAGNOSIS — E785 Hyperlipidemia, unspecified: Secondary | ICD-10-CM | POA: Diagnosis not present

## 2023-12-28 LAB — BAYER DCA HB A1C WAIVED: HB A1C (BAYER DCA - WAIVED): 7.4 % — ABNORMAL HIGH (ref 4.8–5.6)

## 2023-12-28 MED ORDER — OZEMPIC (2 MG/DOSE) 8 MG/3ML ~~LOC~~ SOPN: 2.0000 mg | PEN_INJECTOR | SUBCUTANEOUS | 3 refills | Status: DC

## 2023-12-28 NOTE — Assessment & Plan Note (Signed)
 Refer to diabetes with CKD plan of care.

## 2023-12-28 NOTE — Progress Notes (Signed)
 BP 113/66   Pulse 72   Temp 98.3 F (36.8 C) (Oral)   Ht 5' 9 (1.753 m)   Wt 199 lb 3.2 oz (90.4 kg)   SpO2 98%   BMI 29.42 kg/m    Subjective:    Patient ID: Jonathan [name], male    DOB: 1945/06/13, 79 y.o.   MRN: [id number]  HPI: Jonathan [name] is a 78 y.o. male  Chief Complaint  Patient presents with   Diabetes   Hyperlipidemia   Hypertension   Diagnosis of melanoma upper right chest (IV) and left posterior shoulder (III) in April 2025.  Follows with [facility] surgical/onc, last visit 11/28/23 and radiology last saw 10/03/23. Had excision of areas on 10/03/23. No mets to lymph nodes.  To follow with dermatology every 6 months.  DIABETES A1c 6.8% April.  Takes Ozempic  2 MG and Metformin  XR 500 MG BID.  Unable to get Ozempic  through assistance. Continues B12 supplement daily.  Tried Invokana , but was discontinued in past due to kidney function.   Hypoglycemic episodes:yes Polydipsia/polyuria: no Visual disturbance: no Chest pain: no Paresthesias: no Glucose Monitoring: no  Accucheck frequency: Not Checking  Fasting glucose:  Post prandial:  Evening:  Before meals: Taking Insulin?: no  Long acting insulin:  Short acting insulin: Blood Pressure Monitoring: a few times a week Retinal Examination: Up to Date -- [facility] Foot Exam: Up to Date Pneumovax: Up to Date Influenza: Up to Date Aspirin: no   ATRIAL FIBRILLATION Takes Eliquis, Metoprolol , and Flecainide. Metoprolol  (this medication makes him tired).  History: Dr. Debby from EP seen in 2021 about left atrial appendage closure device ([employer]), but opted not to do this. Last EF was > 55% on 02/09/21.  Last carotid doppler and stress test was on 07/18/23. Atrial fibrillation status: stable Satisfied with current treatment: yes  Medication side effects:  no Medication compliance: good compliance Etiology of atrial fibrillation:  Palpitations:  no Chest pain:  no Dyspnea on exertion:  no Orthopnea:   no Syncope:  no Edema:  no Ventricular rate control: B-blocker Anti-coagulation: long acting   HYPERTENSION / HYPERLIPIDEMIA Taking Amlodipine. Lisinopril  stopped by cardiology in past due to elevation in K+ at 5.7.  Occasional low platelets on labs. Satisfied with current treatment? yes Duration of hypertension: chronic BP monitoring frequency: a few times a week BP range: <130/80 on average BP medication side effects: no Duration of hyperlipidemia: chronic Cholesterol medication side effects: no Cholesterol supplements: none Medication compliance: good compliance Aspirin: no Recent stressors: no Recurrent headaches: no Visual changes: no Palpitations: no Dyspnea: no Chest pain: no Lower extremity edema: no Dizzy/lightheaded: no   CHRONIC KIDNEY DISEASE (3b) Last saw nephrology in 2019. CKD status: stable Medications renally dose: yes Previous renal evaluation: no Pneumovax:  Up to Date Influenza Vaccine:  Up to Date   HYPOTHYROIDISM Continues Levothyroxine  125 MCG. Thyroid  control status:stable Satisfied with current treatment? yes Medication side effects: no Medication compliance: good compliance Etiology of hypothyroidism: unknown Recent dose adjustment:no Fatigue: no Cold intolerance: no Heat intolerance: no Weight gain: no Weight loss: no Constipation: yes -- takes Metamucil every other day which helps Diarrhea/loose stools: no Palpitations: no Lower extremity edema: no Anxiety/depressed mood: no      12/28/2023   11:08 AM 09/14/2023    8:43 AM 06/10/2023    9:53 AM 06/07/2023   11:35 AM 03/10/2023    9:46 AM  Depression screen PHQ 2/9  Decreased Interest 0 0 0 0 0  Down,  Depressed, Hopeless 0 1 1 1 1   PHQ - 2 Score 0 1 1 1 1   Altered sleeping 1 1 0  1  Tired, decreased energy 0 0 0  0  Change in appetite 0 0 0  0  Feeling bad or failure about yourself  0 0 0  0  Trouble concentrating 0 0 0  0  Moving slowly or fidgety/restless 0 0 0  0   Suicidal thoughts 0 0 0  0  PHQ-9 Score 1 2 1  2   Difficult doing work/chores Not difficult at all Not difficult at all Not difficult at all  Not difficult at all       12/28/2023   11:08 AM 09/14/2023    8:43 AM 06/10/2023    9:54 AM 03/10/2023    9:46 AM  GAD 7 : Generalized Anxiety Score  Nervous, Anxious, on Edge 0 1 1 0  Control/stop worrying 0 0 0 0  Worry too much - different things 1 0 0 1  Trouble relaxing 0 0 0 0  Restless 0 0 0 0  Easily annoyed or irritable 0 0 0 0  Afraid - awful might happen 0 0 0 0  Total GAD 7 Score 1 1 1 1   Anxiety Difficulty Not difficult at all Not difficult at all Not difficult at all Not difficult at all   Relevant past medical, surgical, family and social history reviewed and updated as indicated. Interim medical history since our last visit reviewed. Allergies and medications reviewed and updated.  Review of Systems  Constitutional:  Negative for activity change, diaphoresis, fatigue and fever.  Respiratory:  Negative for cough, chest tightness, shortness of breath and wheezing.   Cardiovascular:  Negative for chest pain, palpitations and leg swelling.  Gastrointestinal: Negative.   Endocrine: Negative for cold intolerance, heat intolerance, polydipsia, polyphagia and polyuria.  Neurological: Negative.   Psychiatric/Behavioral: Negative.     Per HPI unless specifically indicated above     Objective:    BP 113/66   Pulse 72   Temp 98.3 F (36.8 C) (Oral)   Ht 5' 9 (1.753 m)   Wt 199 lb 3.2 oz (90.4 kg)   SpO2 98%   BMI 29.42 kg/m   Wt Readings from Last 3 Encounters:  12/28/23 199 lb 3.2 oz (90.4 kg)  09/14/23 193 lb 3.2 oz (87.6 kg)  06/22/23 198 lb 3.2 oz (89.9 kg)    Physical Exam Vitals and nursing note reviewed.  Constitutional:      General: He is awake. He is not in acute distress.    Appearance: He is well-developed, well-groomed and overweight. He is not ill-appearing.  HENT:     Head: Normocephalic and  atraumatic.     Right Ear: Hearing normal. No drainage.     Left Ear: Hearing normal. No drainage.  Eyes:     General: Lids are normal.        Right eye: No discharge.        Left eye: No discharge.     Conjunctiva/sclera: Conjunctivae normal.     Pupils: Pupils are equal, round, and reactive to light.  Neck:     Thyroid : No thyromegaly.     Vascular: No carotid bruit.  Cardiovascular:     Rate and Rhythm: Normal rate and regular rhythm.     Heart sounds: Normal heart sounds, S1 normal and S2 normal. No murmur heard.    No gallop.     Comments: Rhythm regular  today on exam, no extra or irregular beats heard. Pulmonary:     Effort: Pulmonary effort is normal. No accessory muscle usage or respiratory distress.     Breath sounds: Normal breath sounds. No decreased breath sounds, wheezing or rhonchi.  Abdominal:     General: Bowel sounds are normal. There is no distension.     Palpations: Abdomen is soft.     Tenderness: There is no abdominal tenderness.  Musculoskeletal:        General: Normal range of motion.     Cervical back: Normal range of motion and neck supple.     Right lower leg: No edema.     Left lower leg: No edema.  Lymphadenopathy:     Cervical: No cervical adenopathy.  Skin:    General: Skin is warm and dry.     Comments: Scattered bruising bilateral upper extremities.  Neurological:     Mental Status: He is alert and oriented to person, place, and time.  Psychiatric:        Attention and Perception: Attention normal.        Mood and Affect: Mood normal.        Speech: Speech normal.        Behavior: Behavior normal. Behavior is cooperative.        Thought Content: Thought content normal.    Results for orders placed or performed in visit on 12/28/23  Bayer DCA Hb A1c Waived   Collection Time: 12/28/23 11:05 AM  Result Value Ref Range   HB A1C (BAYER DCA - WAIVED) 7.4 (H) 4.8 - 5.6 %      Assessment & Plan:   Problem List Items Addressed This Visit        Cardiovascular and Mediastinum   Senile purpura (HCC)   Ongoing.  Noted on exam, on anticoagulant therapy for a-fib.  Recommend gentle skin care at home and monitor for wounds, if present then immediately notify provider.      Paroxysmal A-fib (HCC)   Chronic, follows with cardiology.  Rhythm regular on exam today.  Continue current medication regimen as prescribed by cardiology at this time and adjust as needed.  Recent cardiology note reviewed.  Continue collaboration, appreciate their input.        Hypertension associated with diabetes (HCC)   Chronic, stable. BP well below goal in office today and on home readings. Recommend he monitor BP at least a few mornings a week at home and document.  DASH diet at home. Continue current medication regimen and adjust as needed.  Labs today: CBC, CMP.   Refills sent as needed.      Relevant Medications   Semaglutide , 2 MG/DOSE, (OZEMPIC , 2 MG/DOSE,) 8 MG/3ML SOPN   Other Relevant Orders   Bayer DCA Hb A1c Waived (Completed)   CBC with Differential/Platelet   Comprehensive metabolic panel with GFR     Endocrine   Type 2 diabetes mellitus with chronic kidney disease, without long-term current use of insulin (HCC) - Primary   Chronic, ongoing.  A1c 7.4% today, trending up from 6.8% but due to recent surgery has not been able to exercise per baseline. Urine Alb 80 (January 2025).   - Continue Ozempic  2 MG weekly + continue Metformin  ER 500 MG BID.  Discussed with patient may benefit in future change to Mounjaro or Farxiga and discontinuation Metformin  due to kidney function.   Renal function limits medications that can be used + cost.  A1c is at goal per diabetes association  and geriatric society based on age, <8%. - Recommend he check blood sugar twice a day, in morning fasting and 2 hours after a meal, document these.   - Collaborate with PharmD Channing. - Could consider a very low dose sulfonyurea if needed to keep cost low in future and  avoid insulin.  At this time recommend heavy diet focus and regular exercise. - Return in 3 months.      Relevant Medications   Semaglutide , 2 MG/DOSE, (OZEMPIC , 2 MG/DOSE,) 8 MG/3ML SOPN   Other Relevant Orders   Bayer DCA Hb A1c Waived (Completed)   Hypothyroidism   Chronic, ongoing.  Continue current medication regimen and adjust as needed based on labs.  Thyroid  labs up to date.      Hyperlipidemia associated with type 2 diabetes mellitus (HCC)   Chronic, ongoing.  Continue with current regimen and adjust as needed based on labs.  Lipid panel today.      Relevant Medications   Semaglutide , 2 MG/DOSE, (OZEMPIC , 2 MG/DOSE,) 8 MG/3ML SOPN   Other Relevant Orders   Bayer DCA Hb A1c Waived (Completed)   Lipid Panel w/o Chol/HDL Ratio   Diabetes mellitus treated with injections of non-insulin medication (HCC)   Refer to diabetes with CKD plan of care.      Relevant Medications   Semaglutide , 2 MG/DOSE, (OZEMPIC , 2 MG/DOSE,) 8 MG/3ML SOPN   Other Relevant Orders   Bayer DCA Hb A1c Waived (Completed)     Musculoskeletal and Integument   Melanoma of skin (HCC)   Excision performed of areas on 10/03/23 and overall areas have crusted over.  On review of pathology no mets to lymph nodes. Continue to collaborate with surg/onc at [facility] and dermatology, Dr. Jackquline.        Genitourinary   CKD (chronic kidney disease), stage III (HCC)   Chronic, ongoing CKD 3b.  Continue to collaborate with nephrology as needed and renal dose medication, Lisinopril  for kidney protection.  CBC and CMP today.  Urine Alb 80 (January 2025).        Hematopoietic and Hemostatic   Thrombocytopenia (HCC)   Chronic, stable.  Recheck CBC every 6 months (ordered today), no current symptoms.  Previously followed by Dr. Corky.  CBC Q6MOS check = January and June.      Relevant Orders   CBC with Differential/Platelet     Other   Vitamin B12 deficiency   Ongoing with chronic Metformin  use, will recheck  this level today and adjust supplement as needed.      Relevant Orders   CBC with Differential/Platelet   Vitamin B12     Follow up plan: Return in about 3 months (around 03/29/2024) for T2DM, HTN/HLD.

## 2023-12-28 NOTE — Assessment & Plan Note (Signed)
 Chronic, ongoing CKD 3b.  Continue to collaborate with nephrology as needed and renal dose medication, Lisinopril  for kidney protection.  CBC and CMP today.  Urine Alb 80 (January 2025).

## 2023-12-28 NOTE — Assessment & Plan Note (Signed)
Chronic, ongoing.   Continue current medication regimen and adjust as needed based on labs.  Thyroid labs up to date. 

## 2023-12-28 NOTE — Assessment & Plan Note (Signed)
Chronic, ongoing.  Continue with current regimen and adjust as needed based on labs.  Lipid panel today.

## 2023-12-28 NOTE — Assessment & Plan Note (Signed)
 Chronic, stable.  Recheck CBC every 6 months (ordered today), no current symptoms.  Previously followed by Dr. Corky.  CBC Q6MOS check = January and June.

## 2023-12-28 NOTE — Assessment & Plan Note (Signed)
 Chronic, ongoing.  A1c 7.4% today, trending up from 6.8% but due to recent surgery has not been able to exercise per baseline. Urine Alb 80 (January 2025).   - Continue Ozempic  2 MG weekly + continue Metformin  ER 500 MG BID.  Discussed with patient may benefit in future change to Mounjaro or Farxiga and discontinuation Metformin  due to kidney function.   Renal function limits medications that can be used + cost.  A1c is at goal per diabetes association and geriatric society based on age, <8%. - Recommend he check blood sugar twice a day, in morning fasting and 2 hours after a meal, document these.   - Collaborate with PharmD Channing. - Could consider a very low dose sulfonyurea if needed to keep cost low in future and avoid insulin.  At this time recommend heavy diet focus and regular exercise. - Return in 3 months.

## 2023-12-28 NOTE — Assessment & Plan Note (Signed)
 Chronic, stable. BP well below goal in office today and on home readings. Recommend he monitor BP at least a few mornings a week at home and document.  DASH diet at home. Continue current medication regimen and adjust as needed.  Labs today: CBC, CMP.   Refills sent as needed.

## 2023-12-28 NOTE — Assessment & Plan Note (Signed)
 Chronic, follows with cardiology.  Rhythm regular on exam today.  Continue current medication regimen as prescribed by cardiology at this time and adjust as needed.  Recent cardiology note reviewed.  Continue collaboration, appreciate their input.

## 2023-12-28 NOTE — Assessment & Plan Note (Addendum)
 Excision performed of areas on 10/03/23 and overall areas have crusted over.  On review of pathology no mets to lymph nodes. Continue to collaborate with surg/onc at [facility] and dermatology, Dr. Jackquline.

## 2023-12-28 NOTE — Assessment & Plan Note (Signed)
Ongoing.  Noted on exam, on anticoagulant therapy for a-fib.  Recommend gentle skin care at home and monitor for wounds, if present then immediately notify provider.

## 2023-12-28 NOTE — Assessment & Plan Note (Signed)
Ongoing with chronic Metformin use, will recheck this level today and adjust supplement as needed. 

## 2023-12-29 ENCOUNTER — Ambulatory Visit: Payer: Self-pay | Admitting: Nurse Practitioner

## 2023-12-29 LAB — COMPREHENSIVE METABOLIC PANEL WITH GFR
ALT: 14 IU/L (ref 0–44)
AST: 22 IU/L (ref 0–40)
Albumin: 4.5 g/dL (ref 3.8–4.8)
Alkaline Phosphatase: 114 IU/L (ref 44–121)
BUN/Creatinine Ratio: 15 (ref 10–24)
BUN: 19 mg/dL (ref 8–27)
Bilirubin Total: 0.8 mg/dL (ref 0.0–1.2)
CO2: 20 mmol/L (ref 20–29)
Calcium: 9.5 mg/dL (ref 8.6–10.2)
Chloride: 102 mmol/L (ref 96–106)
Creatinine, Ser: 1.31 mg/dL — ABNORMAL HIGH (ref 0.76–1.27)
Globulin, Total: 2.5 g/dL (ref 1.5–4.5)
Glucose: 138 mg/dL — ABNORMAL HIGH (ref 70–99)
Potassium: 4.7 mmol/L (ref 3.5–5.2)
Sodium: 138 mmol/L (ref 134–144)
Total Protein: 7 g/dL (ref 6.0–8.5)
eGFR: 56 mL/min/1.73 — ABNORMAL LOW (ref >59)

## 2023-12-29 LAB — CBC WITH DIFFERENTIAL/PLATELET
Basophils Absolute: 0 x10E3/uL (ref 0.0–0.2)
Basos: 1 %
EOS (ABSOLUTE): 0.2 x10E3/uL (ref 0.0–0.4)
Eos: 3 %
Hematocrit: 43.3 % (ref 37.5–51.0)
Hemoglobin: 14.9 g/dL (ref 13.0–17.7)
Immature Grans (Abs): 0 x10E3/uL (ref 0.0–0.1)
Immature Granulocytes: 0 %
Lymphocytes Absolute: 1.4 x10E3/uL (ref 0.7–3.1)
Lymphs: 26 %
MCH: 30 pg (ref 26.6–33.0)
MCHC: 34.4 g/dL (ref 31.5–35.7)
MCV: 87 fL (ref 79–97)
Monocytes Absolute: 0.5 x10E3/uL (ref 0.1–0.9)
Monocytes: 9 %
Neutrophils Absolute: 3.1 x10E3/uL (ref 1.4–7.0)
Neutrophils: 61 %
Platelets: 143 x10E3/uL — ABNORMAL LOW (ref 150–450)
RBC: 4.97 x10E6/uL (ref 4.14–5.80)
RDW: 14.1 % (ref 11.6–15.4)
WBC: 5.2 x10E3/uL (ref 3.4–10.8)

## 2023-12-29 LAB — VITAMIN B12: Vitamin B-12: 1289 pg/mL — ABNORMAL HIGH (ref 232–1245)

## 2023-12-29 LAB — LIPID PANEL W/O CHOL/HDL RATIO
Cholesterol, Total: 131 mg/dL (ref 100–199)
HDL: 52 mg/dL (ref >39)
LDL Chol Calc (NIH): 57 mg/dL (ref 0–99)
Triglycerides: 127 mg/dL (ref 0–149)
VLDL Cholesterol Cal: 22 mg/dL (ref 5–40)

## 2023-12-29 NOTE — Progress Notes (Signed)
 Contacted via MyChart  Good afternoon [name], your labs have returned and remain at baseline/stable. B12 remains a little too high, try to reduce B12 intake to twice or once a week if taking.   - Kidney function, creatinine and eGFR, continues to show stable chronic kidney disease (CKD) stage 3a with no worsening.  Any questions? Keep being amazing!!  Thank you for allowing me to participate in your care.  I appreciate you. Kindest regards, Alethia Melendrez

## 2024-01-10 ENCOUNTER — Encounter: Payer: Self-pay | Admitting: Nurse Practitioner

## 2024-01-18 DIAGNOSIS — I340 Nonrheumatic mitral (valve) insufficiency: Secondary | ICD-10-CM | POA: Diagnosis not present

## 2024-01-18 DIAGNOSIS — I480 Paroxysmal atrial fibrillation: Secondary | ICD-10-CM | POA: Diagnosis not present

## 2024-02-02 DIAGNOSIS — E782 Mixed hyperlipidemia: Secondary | ICD-10-CM | POA: Diagnosis not present

## 2024-02-02 DIAGNOSIS — I480 Paroxysmal atrial fibrillation: Secondary | ICD-10-CM | POA: Diagnosis not present

## 2024-02-02 DIAGNOSIS — I10 Essential (primary) hypertension: Secondary | ICD-10-CM | POA: Diagnosis not present

## 2024-02-02 DIAGNOSIS — I340 Nonrheumatic mitral (valve) insufficiency: Secondary | ICD-10-CM | POA: Diagnosis not present

## 2024-02-14 ENCOUNTER — Ambulatory Visit (INDEPENDENT_AMBULATORY_CARE_PROVIDER_SITE_OTHER)

## 2024-02-14 DIAGNOSIS — Z23 Encounter for immunization: Secondary | ICD-10-CM

## 2024-02-14 NOTE — Progress Notes (Signed)
 Patient is in office today for a nurse visit for Flu Vaccine. Patient Injection was given in the  Left deltoid. Patient tolerated injection well.

## 2024-03-22 DIAGNOSIS — Z01810 Encounter for preprocedural cardiovascular examination: Secondary | ICD-10-CM | POA: Diagnosis not present

## 2024-03-22 DIAGNOSIS — N183 Chronic kidney disease, stage 3 unspecified: Secondary | ICD-10-CM | POA: Diagnosis not present

## 2024-03-22 DIAGNOSIS — I6523 Occlusion and stenosis of bilateral carotid arteries: Secondary | ICD-10-CM | POA: Diagnosis not present

## 2024-03-22 DIAGNOSIS — I480 Paroxysmal atrial fibrillation: Secondary | ICD-10-CM | POA: Diagnosis not present

## 2024-03-22 DIAGNOSIS — I4892 Unspecified atrial flutter: Secondary | ICD-10-CM | POA: Diagnosis not present

## 2024-03-22 DIAGNOSIS — E782 Mixed hyperlipidemia: Secondary | ICD-10-CM | POA: Diagnosis not present

## 2024-03-22 DIAGNOSIS — I10 Essential (primary) hypertension: Secondary | ICD-10-CM | POA: Diagnosis not present

## 2024-03-22 DIAGNOSIS — E119 Type 2 diabetes mellitus without complications: Secondary | ICD-10-CM | POA: Diagnosis not present

## 2024-03-22 NOTE — Progress Notes (Signed)
 Established Patient Visit   Chief Complaint: Chief Complaint  Patient presents with  . Follow-up    A.fib   Date of Service: 03/22/2024 Date of Birth: 08-17-1945 PCP: [facility] [address] [place] [address]  History of Present Illness:   Mr. Jonathan [name] is a 78 y.o.male patient that presents for same day f/u.   PMH: Paroxysmal atrial flutter Paroxysmal atrial fibrillation S/P atrial flutter 2011, 2013 S/P DCCV for atrial fibrillation 2021, 05/2023 HTN HLD T2DM Bilateral carotid artery disease, 50-69% stenosis L ICA, no significant R ICA stenosis by 02/09/2021 US  Hypothyroidism Hypomagnesemia  Last seen by Dr. Wilburn 02/02/2024. Doing well and in NSR.    Past medical history significant for paroxysmal atrial fibrillation/flutter status post ablation for A-fib and flutter in 2011 and 2013 with Dr. Debby at [facility].  Cardioversion for A-fib in 2021 and since been on flecainide.  Again developed A-fib 05/2023 and underwent successful cardioversion.  Remains on flecainide, treadmill stress test 06/2023 with no QRS widening, no ischemia at 77% MPHR.  Echocardiogram 12/2023 with normal biventricular systolic function, LVEF greater than 55%, moderate mitral regurgitation, mild pulm hypertension with RVSP of 40 mmHg.   Patient reports feeling kind of funny 2-3 days ago. Denies functional decline, dyspnea, chest pain, edema, dizziness. He did note some fatigue with walking in last few days. Checks his pulse oximetry and BP at home with home HR 115-136 bpm. Denies symptomatic palpitations. EKG today reveals atrial flutter with variable AV block, HR 106 bpm. He is compliant with Eliquis and has not missed any doses. He is compliant with flecainide and metoprolol  tartrate and has not missed any doses. Last dose of Ozempic  03/18/24.  Past Medical and Surgical History  Past Medical History Past Medical History:  Diagnosis Date  . Coronary artery disease   . COVID-19 04/2019  .  Hyperlipidemia   . Hypertension     Past Surgical History He has no past surgical history on file.   Medications and Allergies  Current Medications  Current Outpatient Medications on File Prior to Visit  Medication Sig Dispense Refill  . amLODIPine (NORVASC) 5 MG tablet TAKE 1 TABLET BY MOUTH EVERY DAY 90 tablet 3  . apixaban (ELIQUIS) 5 mg tablet TAKE 1 TABLET BY MOUTH TWICE A DAY 180 tablet 3  . atorvastatin  (LIPITOR) 80 MG tablet Take 1 tablet (80 mg total) by mouth once daily 90 tablet 3  . cholecalciferol (VITAMIN D3) 1,000 unit capsule Take 1,000 Units by mouth once daily.    . cyanocobalamin  (VITAMIN B12) 1000 MCG tablet Take 1,000 mcg by mouth once daily       . flecainide (TAMBOCOR) 50 MG tablet TAKE 2 TABLETS BY MOUTH TWICE A DAY 360 tablet 1  . levothyroxine  (SYNTHROID ) 125 MCG tablet Take 125 mcg by mouth once daily Take on an empty stomach with a glass of water at least 30-60 minutes before breakfast.       . lisinopriL  (ZESTRIL ) 2.5 MG tablet Take 2.5 mg by mouth once daily    . magnesium  oxide (MAG-OX) 400 mg tablet Take 400 mg by mouth once daily       . metFORMIN  (GLUCOPHAGE ) 500 MG tablet Take 500 mg by mouth 2 (two) times daily     3  . metoprolol  TARTrate (LOPRESSOR ) 25 MG tablet Take 1 tablet (25 mg total) by mouth 2 (two) times daily 60 tablet 11  . multivitamin tablet Take 1 tablet by mouth once daily       .  semaglutide  (OZEMPIC ) 2 mg/dose (8 mg/3 mL) pen injector Inject 2 mg subcutaneously once a week     No current facility-administered medications on file prior to visit.    Allergies: Patient has no known allergies.  Social and Family History  Social History  reports that he has quit smoking. His smoking use included cigarettes. He has never used smokeless tobacco. He reports current alcohol use. He reports that he does not use drugs.  Family History Family History  Problem Relation Name Age of Onset  . No Known Problems Mother    . No Known Problems  Father      Review of Systems   Review of Systems  Constitutional:  Positive for malaise/fatigue.       Denies weight gain  Respiratory:  Negative for shortness of breath.   Cardiovascular:  Negative for chest pain, palpitations and leg swelling.  Neurological:  Negative for dizziness.  Endo/Heme/Allergies:  Does not bruise/bleed easily.      Physical Examination   Vitals:BP 122/76   Pulse (!) 113   Wt 93 kg (205 lb)   SpO2 96%   BMI 29.41 kg/m  Ht:  Wt:93 kg (205 lb) ADJ:Anib surface area is 2.14 meters squared. Body mass index is 29.41 kg/m.  Physical Exam Vitals reviewed.  Constitutional:      General: He is not in acute distress.    Appearance: Normal appearance.  Cardiovascular:     Rate and Rhythm: Tachycardia present. Rhythm irregularly irregular.     Heart sounds: Normal heart sounds. No murmur heard. Pulmonary:     Effort: Pulmonary effort is normal. No respiratory distress.     Breath sounds: Normal breath sounds.  Musculoskeletal:     Right lower leg: No edema.     Left lower leg: No edema.  Neurological:     Mental Status: He is alert.     Data & Results   No results for input(s): CHOLTOTAL, HDL, LDLCALC, LDLDIRECT, LDL, VLDL, TRIG in the last 73719 hours.  Recent Labs    06/03/23 0938  NA 138  K 5.7*  BUN 27*  CREATININE 1.6*  CO2 27.4  GLUCOSE 171*    Recent Labs    06/03/23 0938  WBC 8.5  HGB 13.4*  HCT 38.9*  MCV 86.6  PLT 191    No results for input(s): INR, TSH, HGBA1C, HBA1C, PROBNP, BNP, HSTNI, DELTAHSTNI, HSTNIINTERP, TROPONINT, CK, CKMB, DDIMER in the last 73719 hours.      Assessment   78 y.o. male with  Encounter Diagnoses  Name Primary?  . Atrial flutter, unspecified type (CMS/HHS-HCC) Yes  . Paroxysmal A-fib (CMS/HHS-HCC)   . Mixed hyperlipidemia   . Benign essential hypertension   . Preop cardiovascular exam   . Type 2 diabetes mellitus without complication, without  long-term current use of insulin (CMS/HHS-HCC)   . Bilateral carotid artery stenosis   . Stage 3 chronic kidney disease, unspecified whether stage 3a or 3b CKD (CMS-HCC)     Plan   Orders Placed This Encounter  Procedures  . Magnesium   . Thyroid  Stimulating-Hormone (TSH)  . Basic Metabolic Panel (BMP)  . CBC w/auto Differential (5 Part)  . ECG 12-lead   - Pt with recurrent atrial flutter, HR 106 by EKG today. Pt minimally symptomatic, hemodynamically stable. Discussed w/ Dr. Wilburn. Continue metoprolol  tartrate at current dosing. Continue flecainide and Eliquis 5 mg BID. Pt reports medication compliance without missed doses. On Eliquis for years. Schedule for next available DCCV. Refer  to Dr. Debby for evaluation for ablation.  - Lab w/u with recurrent AF/AFL this year - Flecainide monitoring:  Check Mg today Treadmill stress test 06/2023 with no QRS widening, no ischemia at 77% MPHR.  Echocardiogram 12/2023 with normal biventricular systolic function, LVEF greater than 55% QRS today 451 ms, with no increase since last EKG - ED precautions discussed for worsening s/s. - BP at goal. Continue current regimen.  - Continue atorvastatin  80 mg qd. Last LDL at goal of < 70 mg/dL with U7IF.  - Continue Eliquis and atorvastatin  for secondary prevention with bilateral carotid artery stenosis. Pt asymptomatic.    Refer EP  Return in about 2 weeks (around 04/05/2024). - follow up after DCCV   Attestation Statement:   I personally performed the service, non-incident to. [facility])   MARY TINNIE LAUNIE MAIDEN, NP

## 2024-03-26 MED ORDER — SODIUM CHLORIDE 0.9 % IV SOLN: INTRAVENOUS | Status: DC

## 2024-03-27 ENCOUNTER — Ambulatory Visit: Admitting: Anesthesiology

## 2024-03-27 ENCOUNTER — Encounter
Admission: RE | Disposition: A | Discharge status: Home / Self Care | Payer: Self-pay | Source: Home / Self Care | Attending: Cardiology

## 2024-03-27 ENCOUNTER — Ambulatory Visit
Admission: RE | Admit: 2024-03-27 | Discharge: 2024-03-27 | Disposition: A | Discharge status: Home / Self Care | Attending: Cardiology | Admitting: Cardiology

## 2024-03-27 ENCOUNTER — Other Ambulatory Visit: Payer: Self-pay

## 2024-03-27 DIAGNOSIS — I4892 Unspecified atrial flutter: Secondary | ICD-10-CM | POA: Insufficient documentation

## 2024-03-27 DIAGNOSIS — E785 Hyperlipidemia, unspecified: Secondary | ICD-10-CM | POA: Diagnosis not present

## 2024-03-27 DIAGNOSIS — I4891 Unspecified atrial fibrillation: Secondary | ICD-10-CM | POA: Diagnosis present

## 2024-03-27 DIAGNOSIS — Z87891 Personal history of nicotine dependence: Secondary | ICD-10-CM | POA: Diagnosis not present

## 2024-03-27 DIAGNOSIS — E1165 Type 2 diabetes mellitus with hyperglycemia: Secondary | ICD-10-CM | POA: Diagnosis not present

## 2024-03-27 DIAGNOSIS — I10 Essential (primary) hypertension: Secondary | ICD-10-CM | POA: Insufficient documentation

## 2024-03-27 DIAGNOSIS — E039 Hypothyroidism, unspecified: Secondary | ICD-10-CM | POA: Diagnosis not present

## 2024-03-27 DIAGNOSIS — I480 Paroxysmal atrial fibrillation: Secondary | ICD-10-CM | POA: Diagnosis not present

## 2024-03-27 DIAGNOSIS — I4581 Long QT syndrome: Secondary | ICD-10-CM | POA: Diagnosis not present

## 2024-03-27 HISTORY — PX: CARDIOVERSION: SHX1299

## 2024-03-27 LAB — GLUCOSE, CAPILLARY: Glucose-Capillary: 186 mg/dL — ABNORMAL HIGH (ref 70–99)

## 2024-03-27 SURGERY — CARDIOVERSION
Anesthesia: General

## 2024-03-27 MED ORDER — PROPOFOL 10 MG/ML IV BOLUS
INTRAVENOUS | Status: DC | PRN
  Administered 2024-03-27: 80 mg via INTRAVENOUS

## 2024-03-27 MED ORDER — PROPOFOL 1000 MG/100ML IV EMUL
INTRAVENOUS | Status: AC
Start: 2024-03-27 — End: 2024-03-27
  Filled 2024-03-27: qty 100

## 2024-03-27 MED ORDER — LIDOCAINE HCL (PF) 2 % IJ SOLN
INTRAMUSCULAR | Status: AC
  Filled 2024-03-27: qty 5

## 2024-03-27 NOTE — Anesthesia Preprocedure Evaluation (Signed)
 Anesthesia Evaluation  Patient identified by MRN, date of birth, ID band Patient awake    Reviewed: Allergy & Precautions, H&P , NPO status , Patient's Chart, lab work & pertinent test results, reviewed documented beta blocker date and time   Airway Mallampati: II   Neck ROM: full    Dental  (+) Poor Dentition   Pulmonary neg pulmonary ROS, former smoker   Pulmonary exam normal        Cardiovascular Exercise Tolerance: Poor hypertension, On Medications + Peripheral Vascular Disease  Atrial Fibrillation  Rhythm:regular Rate:Normal     Neuro/Psych negative neurological ROS  negative psych ROS   GI/Hepatic negative GI ROS, Neg liver ROS,,,  Endo/Other  diabetes, Poorly ControlledHypothyroidism    Renal/GU Renal disease  negative genitourinary   Musculoskeletal   Abdominal   Peds  Hematology negative hematology ROS (+)   Anesthesia Other Findings Past Medical History: No date: Atrial flutter (HCC) No date: Chronic kidney disease No date: Diabetes mellitus without complication (HCC) 08/15/2023: Dysplastic nevus     Comment:  left shoulder, mod No date: Hyperlipidemia No date: Hypertension No date: Lymphopenia 08/15/2023: Melanoma (HCC)     Comment:  Right chest, 1.1 mm, Level IV, WLE and SNL - neg               10/03/2023 08/15/2023: Melanoma (HCC)     Comment:  Left upper back, 1.0 mm, Level IV (bx as level III,               upgraded with WLE 10/03/2023, SLN neg No date: Thrombocytopenia Past Surgical History: 11/22/2019: CARDIOVERSION; N/A     Comment:  Procedure: CARDIOVERSION;  Surgeon: Hester Wolm PARAS,               MD;  Location: [facility] ORS;  Service: Cardiovascular;                Laterality: N/A; 06/14/2023: CARDIOVERSION; N/A     Comment:  Procedure: CARDIOVERSION;  Surgeon: Alluri, Keller BROCKS,               MD;  Location: [facility] ORS;  Service: Cardiovascular;                Laterality:  N/A; 06/22/2023: CARDIOVERSION; N/A     Comment:  Procedure: CARDIOVERSION;  Surgeon: Dewane Shiner,               DO;  Location: [facility] OR[facility];  Service: Cardiovascular;                Laterality: N/A; No date: heart ablation No date: KNEE SURGERY; Bilateral No date: MELANOMA EXCISION 08/15/2023: MELANOMA EXCISION     Comment:  chest and back 06/14/2023: TEE WITHOUT CARDIOVERSION; N/A     Comment:  Procedure: TRANSESOPHAGEAL ECHOCARDIOGRAM (TEE);                Surgeon: Alluri, Keller BROCKS, MD;  Location: [facility] OR[facility];                Service: Cardiovascular;  Laterality: N/A; 06/22/2023: TEE WITHOUT CARDIOVERSION; N/A     Comment:  Procedure: TRANSESOPHAGEAL ECHOCARDIOGRAM (TEE);                Surgeon: Dewane Shiner, DO;  Location: [facility] OR[facility];                Service: Cardiovascular;  Laterality: N/A; BMI    Body Mass Index: 29.89 kg/m     Reproductive/Obstetrics negative OB ROS  Anesthesia Physical Anesthesia Plan  ASA: 4 and emergent  Anesthesia Plan: General   Post-op Pain Management:    Induction:   PONV Risk Score and Plan:   Airway Management Planned:   Additional Equipment:   Intra-op Plan:   Post-operative Plan:   Informed Consent: I have reviewed the patients History and Physical, chart, labs and discussed the procedure including the risks, benefits and alternatives for the proposed anesthesia with the patient or authorized representative who has indicated his/her understanding and acceptance.     Dental Advisory Given  Plan Discussed with: CRNA  Anesthesia Plan Comments:         Anesthesia Quick Evaluation

## 2024-03-27 NOTE — Procedures (Signed)
 Electrical Cardioversion Procedure Note  Indication: Atrial Flutter  Procedure Details: Consent: Indication, Risk/benefits of procedure as well as the alternatives explained to patient and informed consent obtained. Time out performed. Verified patient identification, verified procedure, verified correct patient position, special equipment/implants available, medications/allergies/relevent history reviewed, required imaging and test results reviewed.  Deep sedation was provided by anesthesia with propofol . Patient was delivered with 200 Joules of electricity X 1 with success to Sinus rhythm. Patient tolerated the procedure well. No immediate complication noted.   Successful cardioversion  Joetta Mustache, MD [facility]- [facility]

## 2024-03-27 NOTE — Transfer of Care (Signed)
 Immediate Anesthesia Transfer of Care Note  Patient: Jonathan [name]  Procedure(s) Performed: CARDIOVERSION  Patient Location: PACU and Nursing Unit  Anesthesia Type:General  Level of Consciousness: drowsy and patient cooperative  Airway & Oxygen Therapy: Patient Spontanous Breathing and Patient connected to nasal cannula oxygen  Post-op Assessment: Report given to RN and Post -op Vital signs reviewed and stable  Post vital signs: Reviewed and stable  Last Vitals:  Vitals Value Taken Time  BP 93/67 03/27/24 12:45  Temp    Pulse 76 03/27/24 12:44  Resp 16 03/27/24 12:45  SpO2 98 % 03/27/24 12:45  Vitals shown include unfiled device data.  Last Pain:  Vitals:   03/27/24 1107  TempSrc: Temporal  PainSc: 0-No pain         Complications: No notable events documented.

## 2024-03-27 NOTE — Anesthesia Postprocedure Evaluation (Signed)
 Anesthesia Post Note  Patient: Jonathan [name]  Procedure(s) Performed: CARDIOVERSION  Patient location during evaluation: PACU Anesthesia Type: General Level of consciousness: awake and alert Pain management: pain level controlled Vital Signs Assessment: post-procedure vital signs reviewed and stable Respiratory status: spontaneous breathing, nonlabored ventilation, respiratory function stable and patient connected to nasal cannula oxygen Cardiovascular status: blood pressure returned to baseline and stable Postop Assessment: no apparent nausea or vomiting Anesthetic complications: no   No notable events documented.   Last Vitals:  Vitals:   03/27/24 1300 03/27/24 1315  BP: 106/75 110/68  Pulse: 76 72  Resp: 18 18  Temp: (!) 36 C   SpO2: 96% 97%    Last Pain:  Vitals:   03/27/24 1315  TempSrc:   PainSc: 0-No pain                 Lynwood KANDICE Clause

## 2024-03-28 ENCOUNTER — Encounter: Payer: Self-pay | Admitting: Cardiology

## 2024-03-29 ENCOUNTER — Ambulatory Visit: Admitting: Nurse Practitioner

## 2024-04-04 DIAGNOSIS — I10 Essential (primary) hypertension: Secondary | ICD-10-CM | POA: Diagnosis not present

## 2024-04-04 DIAGNOSIS — Z8679 Personal history of other diseases of the circulatory system: Secondary | ICD-10-CM | POA: Diagnosis not present

## 2024-04-04 DIAGNOSIS — Z9889 Other specified postprocedural states: Secondary | ICD-10-CM | POA: Diagnosis not present

## 2024-04-04 DIAGNOSIS — I6523 Occlusion and stenosis of bilateral carotid arteries: Secondary | ICD-10-CM | POA: Diagnosis not present

## 2024-04-04 DIAGNOSIS — I4892 Unspecified atrial flutter: Secondary | ICD-10-CM | POA: Diagnosis not present

## 2024-04-04 DIAGNOSIS — E039 Hypothyroidism, unspecified: Secondary | ICD-10-CM | POA: Diagnosis not present

## 2024-04-04 DIAGNOSIS — E119 Type 2 diabetes mellitus without complications: Secondary | ICD-10-CM | POA: Diagnosis not present

## 2024-04-04 DIAGNOSIS — N183 Chronic kidney disease, stage 3 unspecified: Secondary | ICD-10-CM | POA: Diagnosis not present

## 2024-04-04 DIAGNOSIS — E782 Mixed hyperlipidemia: Secondary | ICD-10-CM | POA: Diagnosis not present

## 2024-04-04 DIAGNOSIS — Z9289 Personal history of other medical treatment: Secondary | ICD-10-CM | POA: Diagnosis not present

## 2024-04-04 DIAGNOSIS — I480 Paroxysmal atrial fibrillation: Secondary | ICD-10-CM | POA: Diagnosis not present

## 2024-04-06 DIAGNOSIS — I4892 Unspecified atrial flutter: Secondary | ICD-10-CM | POA: Diagnosis not present

## 2024-04-07 NOTE — Patient Instructions (Signed)

## 2024-04-09 ENCOUNTER — Encounter: Payer: Self-pay | Admitting: Nurse Practitioner

## 2024-04-09 ENCOUNTER — Ambulatory Visit: Admitting: Nurse Practitioner

## 2024-04-09 VITALS — BP 129/78 | HR 72 | Temp 98.4°F | Resp 14 | Ht 69.02 in | Wt 202.0 lb

## 2024-04-09 DIAGNOSIS — Z7985 Long-term (current) use of injectable non-insulin antidiabetic drugs: Secondary | ICD-10-CM

## 2024-04-09 DIAGNOSIS — E1122 Type 2 diabetes mellitus with diabetic chronic kidney disease: Secondary | ICD-10-CM

## 2024-04-09 DIAGNOSIS — N1832 Chronic kidney disease, stage 3b: Secondary | ICD-10-CM | POA: Diagnosis not present

## 2024-04-09 DIAGNOSIS — C439 Malignant melanoma of skin, unspecified: Secondary | ICD-10-CM

## 2024-04-09 DIAGNOSIS — E1169 Type 2 diabetes mellitus with other specified complication: Secondary | ICD-10-CM

## 2024-04-09 DIAGNOSIS — E785 Hyperlipidemia, unspecified: Secondary | ICD-10-CM | POA: Diagnosis not present

## 2024-04-09 DIAGNOSIS — I480 Paroxysmal atrial fibrillation: Secondary | ICD-10-CM

## 2024-04-09 DIAGNOSIS — E1159 Type 2 diabetes mellitus with other circulatory complications: Secondary | ICD-10-CM | POA: Diagnosis not present

## 2024-04-09 DIAGNOSIS — I152 Hypertension secondary to endocrine disorders: Secondary | ICD-10-CM

## 2024-04-09 DIAGNOSIS — Z8679 Personal history of other diseases of the circulatory system: Secondary | ICD-10-CM

## 2024-04-09 DIAGNOSIS — E119 Type 2 diabetes mellitus without complications: Secondary | ICD-10-CM | POA: Diagnosis not present

## 2024-04-09 LAB — BAYER DCA HB A1C WAIVED: HB A1C (BAYER DCA - WAIVED): 7.8 % — ABNORMAL HIGH (ref 4.8–5.6)

## 2024-04-09 NOTE — Assessment & Plan Note (Signed)
 Refer to diabetes with CKD plan of care.

## 2024-04-09 NOTE — Assessment & Plan Note (Signed)
Chronic, ongoing.  Continue with current regimen and adjust as needed based on labs.  Lipid panel today.

## 2024-04-09 NOTE — Assessment & Plan Note (Signed)
 Chronic, ongoing.  A1c 7.8% today, trending up from 7.4% much due to recent stressors and not being able to walk as much due to A-Fib. Urine Alb 80 (January 2025).   - Continue Ozempic  2 MG weekly + continue Metformin  ER 500 MG BID.  Discussed with patient may benefit in future change to Mounjaro or add on Farxiga and discontinuation Metformin  due to kidney function.   Renal function limits medications that can be used + cost.  A1c is at goal per diabetes association and geriatric society based on age, <8%. Did recommend addition of another medication, but he wishes to hold off until after his upcoming surgery. He is aware to reach out and changes mind on this. - Recommend he check blood sugar twice a day, in morning fasting and 2 hours after a meal, document these.   - Collaborate with PharmD Channing. - Could consider a very low dose sulfonyurea if needed to keep cost low in future and avoid insulin.  At this time recommend heavy diet focus and regular exercise. - Return in 3 months.

## 2024-04-09 NOTE — Assessment & Plan Note (Signed)
 Chronic, ongoing CKD 3b.  Continue to collaborate with nephrology as needed and renal dose medication, Lisinopril for kidney protection.  CMP today.  Urine Alb 80 (January 2025).

## 2024-04-09 NOTE — Assessment & Plan Note (Signed)
 Chronic, stable. BP well below goal in office today and on home readings. Recommend he monitor BP at least a few mornings a week at home and document.  DASH diet at home. Continue current medication regimen and adjust as needed.  Labs today: CMP.   Refills sent as needed.

## 2024-04-09 NOTE — Progress Notes (Signed)
 BP 129/78 (BP Location: Left Arm, Patient Position: Sitting, Cuff Size: Normal)   Pulse 72   Temp 98.4 F (36.9 C) (Oral)   Resp 14   Ht 5' 9.02 (1.753 m)   Wt 202 lb (91.6 kg)   SpO2 98%   BMI 29.82 kg/m    Subjective:    Patient ID: Jonathan [name], male    DOB: 06/25/1945, 78 y.o.   MRN: [id number]  HPI: [name] is a 78 y.o. male  Chief Complaint  Patient presents with   Diabetes    No home checks.    Heart Problem    Is pending possible ablation.    DIABETES August A1c 7.4%.  Takes Ozempic  2 MG and Metformin  XR 500 MG BID.  Unable to get Ozempic  through assistance. Continues B12 supplement, but takes two days a week. Has not been able to exercise as much due to heart. A lot of stressors.  Tried Invokana , but was discontinued in past due to kidney function.   Hypoglycemic episodes:yes Polydipsia/polyuria: no Visual disturbance: no Chest pain: no Paresthesias: no Glucose Monitoring: no  Accucheck frequency: occasional  Fasting glucose:  Post prandial:  Evening:  Before meals: Taking Insulin?: no  Long acting insulin:  Short acting insulin: Blood Pressure Monitoring: a few times a week Retinal Examination: Not Up to Date -- [facility] coming up Foot Exam: Up to Date Pneumovax: Up to Date Influenza: Up to Date Aspirin: no   ATRIAL FIBRILLATION Continues Eliquis, Metoprolol , and Flecainide. Metoprolol . Saw cardiology last on 04/07/24, no changes. TEE/cardioversion last 03/27/24, this was not successful and is scheduled for another cardioversion and possible [facility] upcoming - Dr. Debby.  History: Dr. Debby from EP seen in 2021 about left atrial appendage closure device (Watchmen), but opted not to do this. Last EF was > 55% on 01/18/24.  Last carotid doppler and stress test was on 07/18/23. Atrial fibrillation status: stable Satisfied with current treatment: yes  Medication side effects:  no Medication compliance: good compliance Etiology of atrial  fibrillation: unknown Palpitations:  yes Chest pain:  no Dyspnea on exertion:  no Orthopnea:  no Syncope:  no Edema:  no Ventricular rate control: B-blocker Anti-coagulation: long acting   HYPERTENSION / HYPERLIPIDEMIA Continues Amlodipine. Lisinopril  stopped by cardiology in past due to elevation in K+ at 5.7.   Satisfied with current treatment? yes Duration of hypertension: chronic BP monitoring frequency: a few times a week BP range: <130/80 on average BP medication side effects: no Duration of hyperlipidemia: chronic Cholesterol medication side effects: no Cholesterol supplements: none Medication compliance: good compliance Aspirin: no Recent stressors: no Recurrent headaches: no Visual changes: no Palpitations: yes Dyspnea: no Chest pain: no Lower extremity edema: no Dizzy/lightheaded: no   CHRONIC KIDNEY DISEASE (3b) Has not seen nephrology since 2019. CKD status: stable Medications renally dose: yes Previous renal evaluation: no Pneumovax:  Up to Date Influenza Vaccine:  Up to Date   HYPOTHYROIDISM Takes Levothyroxine  125 MCG. Thyroid  control status:stable Satisfied with current treatment? yes Medication side effects: no Medication compliance: good compliance Etiology of hypothyroidism: unknown Recent dose adjustment:no Fatigue: no Cold intolerance: no Heat intolerance: no Weight gain: no Weight loss: no Constipation: yes -- takes Metamucil every other day which helps Diarrhea/loose stools: no Palpitations: no Lower extremity edema: no Anxiety/depressed mood: no      04/09/2024   10:50 AM 12/28/2023   11:08 AM 09/14/2023    8:43 AM 06/10/2023    9:53 AM 06/07/2023  11:35 AM  Depression screen PHQ 2/9  Decreased Interest 0 0 0 0 0  Down, Depressed, Hopeless 0 0 1 1 1   PHQ - 2 Score 0 0 1 1 1   Altered sleeping 0 1 1 0   Tired, decreased energy 0 0 0 0   Change in appetite 0 0 0 0   Feeling bad or failure about yourself  0 0 0 0   Trouble  concentrating 0 0 0 0   Moving slowly or fidgety/restless 0 0 0 0   Suicidal thoughts 0 0 0 0   PHQ-9 Score 0 1  2  1     Difficult doing work/chores  Not difficult at all Not difficult at all Not difficult at all      Data saved with a previous flowsheet row definition       04/09/2024   10:50 AM 12/28/2023   11:08 AM 09/14/2023    8:43 AM 06/10/2023    9:54 AM  GAD 7 : Generalized Anxiety Score  Nervous, Anxious, on Edge 0 0 1 1  Control/stop worrying 0 0 0 0  Worry too much - different things 0 1 0 0  Trouble relaxing 0 0 0 0  Restless 0 0 0 0  Easily annoyed or irritable 0 0 0 0  Afraid - awful might happen 0 0 0 0  Total GAD 7 Score 0 1 1 1   Anxiety Difficulty  Not difficult at all Not difficult at all Not difficult at all   Relevant past medical, surgical, family and social history reviewed and updated as indicated. Interim medical history since our last visit reviewed. Allergies and medications reviewed and updated.  Review of Systems  Constitutional:  Negative for activity change, diaphoresis, fatigue and fever.  Respiratory:  Negative for cough, chest tightness, shortness of breath and wheezing.   Cardiovascular:  Negative for chest pain, palpitations and leg swelling.  Gastrointestinal: Negative.   Endocrine: Negative for cold intolerance, heat intolerance, polydipsia, polyphagia and polyuria.  Neurological: Negative.   Psychiatric/Behavioral: Negative.     Per HPI unless specifically indicated above     Objective:    BP 129/78 (BP Location: Left Arm, Patient Position: Sitting, Cuff Size: Normal)   Pulse 72   Temp 98.4 F (36.9 C) (Oral)   Resp 14   Ht 5' 9.02 (1.753 m)   Wt 202 lb (91.6 kg)   SpO2 98%   BMI 29.82 kg/m   Wt Readings from Last 3 Encounters:  04/09/24 202 lb (91.6 kg)  03/27/24 202 lb 6.4 oz (91.8 kg)  12/28/23 199 lb 3.2 oz (90.4 kg)    Physical Exam Vitals and nursing note reviewed.  Constitutional:      General: He is awake. He is  not in acute distress.    Appearance: He is well-developed, well-groomed and overweight. He is not ill-appearing.  HENT:     Head: Normocephalic and atraumatic.     Right Ear: Hearing normal. No drainage.     Left Ear: Hearing normal. No drainage.  Eyes:     General: Lids are normal.        Right eye: No discharge.        Left eye: No discharge.     Conjunctiva/sclera: Conjunctivae normal.     Pupils: Pupils are equal, round, and reactive to light.  Neck:     Thyroid : No thyromegaly.     Vascular: No carotid bruit.  Cardiovascular:     Rate and  Rhythm: Normal rate. Rhythm irregularly irregular.     Heart sounds: Normal heart sounds, S1 normal and S2 normal. No murmur heard.    No gallop.  Pulmonary:     Effort: Pulmonary effort is normal. No accessory muscle usage or respiratory distress.     Breath sounds: Normal breath sounds. No decreased breath sounds, wheezing or rhonchi.  Abdominal:     General: Bowel sounds are normal. There is no distension.     Palpations: Abdomen is soft.     Tenderness: There is no abdominal tenderness.  Musculoskeletal:        General: Normal range of motion.     Cervical back: Normal range of motion and neck supple.     Right lower leg: No edema.     Left lower leg: No edema.  Lymphadenopathy:     Cervical: No cervical adenopathy.  Skin:    General: Skin is warm and dry.     Comments: Scattered bruising bilateral upper extremities.  Neurological:     Mental Status: He is alert and oriented to person, place, and time.  Psychiatric:        Attention and Perception: Attention normal.        Mood and Affect: Mood normal.        Speech: Speech normal.        Behavior: Behavior normal. Behavior is cooperative.        Thought Content: Thought content normal.    Results for orders placed or performed during the hospital encounter of 03/27/24  Glucose, capillary   Collection Time: 03/27/24 11:13 AM  Result Value Ref Range   Glucose-Capillary 186  (H) 70 - 99 mg/dL      Assessment & Plan:   Problem List Items Addressed This Visit       Cardiovascular and Mediastinum   Paroxysmal A-fib (HCC)   Relevant Medications   metoprolol  tartrate (LOPRESSOR ) 25 MG tablet   Hypertension associated with diabetes (HCC)   Chronic, stable. BP well below goal in office today and on home readings. Recommend he monitor BP at least a few mornings a week at home and document.  DASH diet at home. Continue current medication regimen and adjust as needed.  Labs today: CMP.   Refills sent as needed.      Relevant Medications   metoprolol  tartrate (LOPRESSOR ) 25 MG tablet   Other Relevant Orders   Bayer DCA Hb A1c Waived   Comprehensive metabolic panel with GFR     Endocrine   Type 2 diabetes mellitus with chronic kidney disease, without long-term current use of insulin (HCC) - Primary   Chronic, ongoing.  A1c 7.8% today, trending up from 7.4% much due to recent stressors and not being able to walk as much due to A-Fib. Urine Alb 80 (January 2025).   - Continue Ozempic  2 MG weekly + continue Metformin  ER 500 MG BID.  Discussed with patient may benefit in future change to Mounjaro or add on Farxiga and discontinuation Metformin  due to kidney function.   Renal function limits medications that can be used + cost.  A1c is at goal per diabetes association and geriatric society based on age, <8%. Did recommend addition of another medication, but he wishes to hold off until after his upcoming surgery. He is aware to reach out and changes mind on this. - Recommend he check blood sugar twice a day, in morning fasting and 2 hours after a meal, document these.   - Collaborate with  PharmD Channing. - Could consider a very low dose sulfonyurea if needed to keep cost low in future and avoid insulin.  At this time recommend heavy diet focus and regular exercise. - Return in 3 months.      Relevant Orders   Bayer DCA Hb A1c Waived   Hyperlipidemia associated with type 2  diabetes mellitus (HCC)   Chronic, ongoing.  Continue with current regimen and adjust as needed based on labs.  Lipid panel today.      Relevant Medications   metoprolol  tartrate (LOPRESSOR ) 25 MG tablet   Other Relevant Orders   Bayer DCA Hb A1c Waived   Comprehensive metabolic panel with GFR   Lipid Panel w/o Chol/HDL Ratio   Diabetes mellitus treated with injections of non-insulin medication (HCC)   Refer to diabetes with CKD plan of care.      Relevant Orders   Bayer DCA Hb A1c Waived     Genitourinary   CKD (chronic kidney disease), stage III (HCC)   Chronic, ongoing CKD 3b.  Continue to collaborate with nephrology as needed and renal dose medication, Lisinopril  for kidney protection.  CMP today.  Urine Alb 80 (January 2025).      Relevant Orders   Comprehensive metabolic panel with GFR     Follow up plan: Return in about 3 months (around 07/10/2024) for T2DM, HTN/HLD, CKD.

## 2024-04-10 ENCOUNTER — Ambulatory Visit: Payer: Self-pay | Admitting: Nurse Practitioner

## 2024-04-10 DIAGNOSIS — E875 Hyperkalemia: Secondary | ICD-10-CM

## 2024-04-10 LAB — LIPID PANEL W/O CHOL/HDL RATIO
Cholesterol, Total: 128 mg/dL (ref 100–199)
HDL: 55 mg/dL (ref >39)
LDL Chol Calc (NIH): 55 mg/dL (ref 0–99)
Triglycerides: 98 mg/dL (ref 0–149)
VLDL Cholesterol Cal: 18 mg/dL (ref 5–40)

## 2024-04-10 LAB — COMPREHENSIVE METABOLIC PANEL WITH GFR
ALT: 11 IU/L (ref 0–44)
AST: 20 IU/L (ref 0–40)
Albumin: 4.3 g/dL (ref 3.8–4.8)
Alkaline Phosphatase: 111 IU/L (ref 47–123)
BUN/Creatinine Ratio: 13 (ref 10–24)
BUN: 20 mg/dL (ref 8–27)
Bilirubin Total: 0.8 mg/dL (ref 0.0–1.2)
CO2: 24 mmol/L (ref 20–29)
Calcium: 9.7 mg/dL (ref 8.6–10.2)
Chloride: 99 mmol/L (ref 96–106)
Creatinine, Ser: 1.53 mg/dL — ABNORMAL HIGH (ref 0.76–1.27)
Globulin, Total: 2.6 g/dL (ref 1.5–4.5)
Glucose: 150 mg/dL — ABNORMAL HIGH (ref 70–99)
Potassium: 5.6 mmol/L — ABNORMAL HIGH (ref 3.5–5.2)
Sodium: 138 mmol/L (ref 134–144)
Total Protein: 6.9 g/dL (ref 6.0–8.5)
eGFR: 46 mL/min/1.73 — ABNORMAL LOW (ref >59)

## 2024-04-10 NOTE — Progress Notes (Signed)
 Lab appt scheduled.

## 2024-04-10 NOTE — Progress Notes (Signed)
 Contacted via MyChart -- lab only visit in one week please  Good morning Jonathan [name], your labs have returned: - Kidney function, creatinine and eGFR, continues to show Stage 3a kidney disease. Liver function normal. - Potassium has trended up a little. I would like to recheck this outpatient in one week please. Recommend reducing potassium rich foods like orange juice, bananas, potatoes, nuts, dried fruit, and avocados.  - Lipid panel shows levels at goal. Any questions? Keep being amazing!!  Thank you for allowing me to participate in your care.  I appreciate you. Kindest regards, Monea Pesantez

## 2024-04-13 DIAGNOSIS — C4359 Malignant melanoma of other part of trunk: Secondary | ICD-10-CM | POA: Diagnosis not present

## 2024-04-13 DIAGNOSIS — I480 Paroxysmal atrial fibrillation: Secondary | ICD-10-CM | POA: Diagnosis not present

## 2024-04-17 ENCOUNTER — Other Ambulatory Visit

## 2024-04-17 DIAGNOSIS — E875 Hyperkalemia: Secondary | ICD-10-CM

## 2024-04-18 ENCOUNTER — Ambulatory Visit: Payer: Self-pay | Admitting: Nurse Practitioner

## 2024-04-18 LAB — POTASSIUM: Potassium: 4.3 mmol/L (ref 3.5–5.2)

## 2024-04-18 NOTE — Progress Notes (Signed)
 Contacted via MyChart  Potassium back to normal range!!  [employer]!!  Happy Thanksgiving.

## 2024-04-23 DIAGNOSIS — I480 Paroxysmal atrial fibrillation: Secondary | ICD-10-CM | POA: Diagnosis not present

## 2024-05-02 DIAGNOSIS — E782 Mixed hyperlipidemia: Secondary | ICD-10-CM | POA: Diagnosis not present

## 2024-05-02 DIAGNOSIS — I4892 Unspecified atrial flutter: Secondary | ICD-10-CM | POA: Diagnosis not present

## 2024-05-02 DIAGNOSIS — I10 Essential (primary) hypertension: Secondary | ICD-10-CM | POA: Diagnosis not present

## 2024-05-02 DIAGNOSIS — E119 Type 2 diabetes mellitus without complications: Secondary | ICD-10-CM | POA: Diagnosis not present

## 2024-05-02 DIAGNOSIS — Z8679 Personal history of other diseases of the circulatory system: Secondary | ICD-10-CM | POA: Diagnosis not present

## 2024-05-02 DIAGNOSIS — N183 Chronic kidney disease, stage 3 unspecified: Secondary | ICD-10-CM | POA: Diagnosis not present

## 2024-05-02 DIAGNOSIS — Z9889 Other specified postprocedural states: Secondary | ICD-10-CM | POA: Diagnosis not present

## 2024-05-02 DIAGNOSIS — I480 Paroxysmal atrial fibrillation: Secondary | ICD-10-CM | POA: Diagnosis not present

## 2024-05-02 DIAGNOSIS — I6523 Occlusion and stenosis of bilateral carotid arteries: Secondary | ICD-10-CM | POA: Diagnosis not present

## 2024-05-03 ENCOUNTER — Encounter: Payer: Self-pay | Admitting: Nurse Practitioner

## 2024-05-25 ENCOUNTER — Encounter: Payer: Self-pay | Admitting: Nurse Practitioner

## 2024-06-10 ENCOUNTER — Other Ambulatory Visit: Payer: Self-pay | Admitting: Nurse Practitioner

## 2024-06-11 MED ORDER — OZEMPIC (2 MG/DOSE) 8 MG/3ML ~~LOC~~ SOPN: 2.0000 mg | PEN_INJECTOR | SUBCUTANEOUS | 3 refills | Status: AC

## 2024-06-11 NOTE — Telephone Encounter (Signed)
 Rx 12/28/23 9ml 3RF- 1 year Rx Requested Prescriptions  Pending Prescriptions Disp Refills   OZEMPIC , 2 MG/DOSE, 8 MG/3ML SOPN [Pharmacy Med Name: OZEMPIC  8 MG/3 ML (2 MG/DOSE)]  11    Sig: INJECT 2 MG UNDER SKIN ONCE A WEEK     Endocrinology:  Diabetes - GLP-1 Receptor Agonists - semaglutide  Failed - 06/11/2024  4:03 PM      Failed - HBA1C in normal range and within 180 days    HB A1C (BAYER DCA - WAIVED)  Date Value Ref Range Status  04/09/2024 7.8 (H) 4.8 - 5.6 % Final    Comment:             Prediabetes: 5.7 - 6.4          Diabetes: >6.4          Glycemic control for adults with diabetes: <7.0          Failed - Cr in normal range and within 360 days    Creatinine  Date Value Ref Range Status  11/15/2011 1.41 (H) 0.60 - 1.30 mg/dL Final   Creatinine, Ser  Date Value Ref Range Status  04/09/2024 1.53 (H) 0.76 - 1.27 mg/dL Final         Passed - Valid encounter within last 6 months    Recent Outpatient Visits           2 months ago Type 2 diabetes mellitus with stage 3b chronic kidney disease, without long-term current use of insulin (HCC)   [place] [facility] [place], [place] T, NP   5 months ago Type 2 diabetes mellitus with stage 3b chronic kidney disease, without long-term current use of insulin (HCC)   [facility] [place], Jolene T, NP   9 months ago Type 2 diabetes mellitus with stage 3b chronic kidney disease, without long-term current use of insulin (HCC)   [place] [facility] [place], Melanie DASEN, NP

## 2024-06-11 NOTE — Addendum Note (Signed)
 Addended by: Naomie Crow T on: 06/11/2024 04:48 PM   Modules accepted: Orders

## 2024-06-12 ENCOUNTER — Other Ambulatory Visit: Payer: Self-pay | Admitting: Nurse Practitioner

## 2024-06-12 ENCOUNTER — Encounter: Admitting: Dermatology

## 2024-06-12 NOTE — Telephone Encounter (Signed)
 Requested medications are due for refill today.  yes  Requested medications are on the active medications list.  yes  Last refill. 06/10/2023 #90 4 rf  Future visit scheduled.   yes  Notes to clinic.  Labs are expired.    Requested Prescriptions  Pending Prescriptions Disp Refills   levothyroxine  (SYNTHROID ) 125 MCG tablet [Pharmacy Med Name: LEVOTHYROXINE  125 MCG TABLET] 90 tablet 4    Sig: TAKE 1 TABLET BY MOUTH EVERY DAY     Endocrinology:  Hypothyroid Agents Failed - 06/12/2024  2:10 PM      Failed - TSH in normal range and within 360 days    TSH  Date Value Ref Range Status  06/10/2023 1.820 0.450 - 4.500 uIU/mL Final         Passed - Valid encounter within last 12 months    Recent Outpatient Visits           2 months ago Type 2 diabetes mellitus with stage 3b chronic kidney disease, without long-term current use of insulin (HCC)   [place] [facility] [place], Park[place] T, NP   5 months ago Type 2 diabetes mellitus with stage 3b chronic kidney disease, without long-term current use of insulin (HCC)   [facility] [place], Woodman T, NP   9 months ago Type 2 diabetes mellitus with stage 3b chronic kidney disease, without long-term current use of insulin (HCC)    [facility] [place], Melanie DASEN, NP

## 2024-06-14 ENCOUNTER — Ambulatory Visit: Admitting: Dermatology

## 2024-06-14 ENCOUNTER — Encounter: Payer: Self-pay | Admitting: Dermatology

## 2024-06-14 DIAGNOSIS — D692 Other nonthrombocytopenic purpura: Secondary | ICD-10-CM | POA: Diagnosis not present

## 2024-06-14 DIAGNOSIS — W908XXA Exposure to other nonionizing radiation, initial encounter: Secondary | ICD-10-CM

## 2024-06-14 DIAGNOSIS — L821 Other seborrheic keratosis: Secondary | ICD-10-CM

## 2024-06-14 DIAGNOSIS — D2261 Melanocytic nevi of right upper limb, including shoulder: Secondary | ICD-10-CM | POA: Diagnosis not present

## 2024-06-14 DIAGNOSIS — L578 Other skin changes due to chronic exposure to nonionizing radiation: Secondary | ICD-10-CM

## 2024-06-14 DIAGNOSIS — D225 Melanocytic nevi of trunk: Secondary | ICD-10-CM | POA: Diagnosis not present

## 2024-06-14 DIAGNOSIS — Z86018 Personal history of other benign neoplasm: Secondary | ICD-10-CM | POA: Diagnosis not present

## 2024-06-14 DIAGNOSIS — D1801 Hemangioma of skin and subcutaneous tissue: Secondary | ICD-10-CM

## 2024-06-14 DIAGNOSIS — Z85820 Personal history of malignant melanoma of skin: Secondary | ICD-10-CM | POA: Diagnosis not present

## 2024-06-14 DIAGNOSIS — Z1283 Encounter for screening for malignant neoplasm of skin: Secondary | ICD-10-CM

## 2024-06-14 DIAGNOSIS — S0031XA Abrasion of nose, initial encounter: Secondary | ICD-10-CM | POA: Diagnosis not present

## 2024-06-14 DIAGNOSIS — L814 Other melanin hyperpigmentation: Secondary | ICD-10-CM

## 2024-06-14 DIAGNOSIS — L570 Actinic keratosis: Secondary | ICD-10-CM

## 2024-06-14 DIAGNOSIS — D229 Melanocytic nevi, unspecified: Secondary | ICD-10-CM

## 2024-06-14 NOTE — Progress Notes (Signed)
 "  Follow-Up Visit   Subjective  Jonathan [name] is a 79 y.o. male who presents for the following: Skin Cancer Screening and Full Body Skin Exam  The patient presents for Total-Body Skin Exam (TBSE) for skin cancer screening and mole check. The patient has spots, moles and lesions to be evaluated, some may be new or changing and the patient may have concern these could be cancer.  Hx MM, DN.  The following portions of the chart were reviewed this encounter and updated as appropriate: medications, allergies, medical history  Review of Systems:  No other skin or systemic complaints except as noted in HPI or Assessment and Plan.  Objective  Well appearing patient in no apparent distress; mood and affect are within normal limits.  A full examination was performed including scalp, head, eyes, ears, nose, lips, neck, chest, axillae, abdomen, back, buttocks, bilateral upper extremities, bilateral lower extremities, hands, feet, fingers, toes, fingernails, and toenails. All findings within normal limits unless otherwise noted below.   Relevant physical exam findings are noted in the Assessment and Plan.  R temple x 2, L post scalp x 4 (6) Erythematous thin papules/macules with gritty scale.   Assessment & Plan   SKIN CANCER SCREENING PERFORMED TODAY.  ACTINIC DAMAGE - Chronic condition, secondary to cumulative UV/sun exposure - diffuse scaly erythematous macules with underlying dyspigmentation - Recommend daily broad spectrum sunscreen SPF 30+ to sun-exposed areas, reapply every 2 hours as needed.  - Staying in the shade or wearing long sleeves, sun glasses (UVA+UVB protection) and wide brim hats (4-inch brim around the entire circumference of the hat) are also recommended for sun protection.  - Call for new or changing lesions.  LENTIGINES, SEBORRHEIC KERATOSES, HEMANGIOMAS - Benign normal skin lesions - Benign-appearing - Call for any changes  MELANOCYTIC NEVI - Tan-brown and/or  pink-flesh-colored symmetric macules and papules, including flesh papule left medial shoulder - 4 mm regular brown macule at right spinal lower back  - 3 mm light brown speckled macule at right upper arm - Benign appearing on exam today - Observation - Call clinic for new or changing moles - Recommend daily use of broad spectrum spf 30+ sunscreen to sun-exposed areas.   HISTORY OF MELANOMA Right Chest 1.1 mm, Level IV, WLE 10/03/2023, SLN neg Left Upper Back, 1.0 mm, Level IV, WLE 10/03/2023, SLN neg - No evidence of recurrence today - No lymphadenopathy - Recommend regular full body skin exams - Recommend daily broad spectrum sunscreen SPF 30+ to sun-exposed areas, reapply every 2 hours as needed.  - Call if any new or changing lesions are noted between office visits,  - pt alternates between Derm q6 mos and [facility] surg onc q6 mos and is being seen q57mos   HISTORY OF DYSPLASTIC NEVUS Left shoulder, moderate, 08/15/2023 No evidence of recurrence today Recommend regular full body skin exams Recommend daily broad spectrum sunscreen SPF 30+ to sun-exposed areas, reapply every 2 hours as needed.  Call if any new or changing lesions are noted between office visits  Purpura - Chronic; persistent and recurrent.  Treatable, but not curable. - Violaceous macules and patches - Benign - Related to trauma, age, sun damage and/or use of blood thinners, chronic use of topical and/or oral steroids - Observe - Can use OTC arnica containing moisturizer such as Dermend Bruise Formula if desired - Call for worsening or other concerns   EXCORIATION Exam: Excoriation at left nasal dorsum  Treatment Plan: Recommend vaseline. Call if not resolving.  AK (ACTINIC KERATOSIS) (6) R temple x 2, L post scalp x 4 (6) Actinic keratoses are precancerous spots that appear secondary to cumulative UV radiation exposure/sun exposure over time. They are chronic with expected duration over 1 year. A portion of actinic  keratoses will progress to squamous cell carcinoma of the skin. It is not possible to reliably predict which spots will progress to skin cancer and so treatment is recommended to prevent development of skin cancer.  Recommend daily broad spectrum sunscreen SPF 30+ to sun-exposed areas, reapply every 2 hours as needed.  Recommend staying in the shade or wearing long sleeves, sun glasses (UVA+UVB protection) and wide brim hats (4-inch brim around the entire circumference of the hat). Call for new or changing lesions. - Destruction of lesion - R temple x 2, L post scalp x 4 (6)  Destruction method: cryotherapy   Informed consent: discussed and consent obtained   Lesion destroyed using liquid nitrogen: Yes   Region frozen until ice ball extended beyond lesion: Yes   Outcome: patient tolerated procedure well with no complications   Post-procedure details: wound care instructions given   Additional details:  Prior to procedure, discussed risks of blister formation, small wound, skin dyspigmentation, or rare scar following cryotherapy. Recommend Vaseline ointment to treated areas while healing.   Return in about 6 months (around 12/12/2024) for TBSE, with Dr. Jackquline, HxDN, HxMM.  LILLETTE Lonell Drones, RMA, am acting as scribe for Rexene Jackquline, MD .   Documentation: I have reviewed the above documentation for accuracy and completeness, and I agree with the above.  Rexene Jackquline, MD   "

## 2024-06-14 NOTE — Patient Instructions (Signed)

## 2024-06-21 ENCOUNTER — Telehealth: Payer: Self-pay

## 2024-06-21 NOTE — Telephone Encounter (Signed)
 Jonathan [name]

## 2024-06-21 NOTE — Telephone Encounter (Signed)
 Medication PA is requested for Ozempic  (2 MG/DOSE) 8MG /3ML pen-injectors  Submitted via cover my meds portal using key: [id number]  Once decision is made update will be provided and patient will be contacted.

## 2024-07-11 ENCOUNTER — Ambulatory Visit: Admitting: Nurse Practitioner

## 2024-07-12 ENCOUNTER — Ambulatory Visit

## 2025-01-01 ENCOUNTER — Ambulatory Visit: Admitting: Dermatology
